# Patient Record
Sex: Male | Born: 1945 | Race: White | Hispanic: No | Marital: Married | Smoking: Former smoker
Health system: Southern US, Community
[De-identification: ages and names within clinical notes are randomized; demographics above are authoritative.]

## PROBLEM LIST (undated history)

## (undated) DIAGNOSIS — I2510 Atherosclerotic heart disease of native coronary artery without angina pectoris: Secondary | ICD-10-CM

## (undated) DIAGNOSIS — G8929 Other chronic pain: Secondary | ICD-10-CM

## (undated) DIAGNOSIS — I10 Essential (primary) hypertension: Secondary | ICD-10-CM

## (undated) DIAGNOSIS — I700 Atherosclerosis of aorta: Secondary | ICD-10-CM

## (undated) DIAGNOSIS — I350 Nonrheumatic aortic (valve) stenosis: Secondary | ICD-10-CM

## (undated) DIAGNOSIS — N1831 Chronic kidney disease, stage 3a: Secondary | ICD-10-CM

## (undated) DIAGNOSIS — K219 Gastro-esophageal reflux disease without esophagitis: Secondary | ICD-10-CM

## (undated) DIAGNOSIS — I5189 Other ill-defined heart diseases: Secondary | ICD-10-CM

## (undated) DIAGNOSIS — Z7982 Long term (current) use of aspirin: Secondary | ICD-10-CM

## (undated) DIAGNOSIS — E669 Obesity, unspecified: Secondary | ICD-10-CM

## (undated) DIAGNOSIS — E785 Hyperlipidemia, unspecified: Secondary | ICD-10-CM

## (undated) DIAGNOSIS — M1990 Unspecified osteoarthritis, unspecified site: Secondary | ICD-10-CM

## (undated) DIAGNOSIS — N529 Male erectile dysfunction, unspecified: Secondary | ICD-10-CM

## (undated) DIAGNOSIS — K861 Other chronic pancreatitis: Secondary | ICD-10-CM

## (undated) DIAGNOSIS — Z961 Presence of intraocular lens: Secondary | ICD-10-CM

## (undated) DIAGNOSIS — E039 Hypothyroidism, unspecified: Secondary | ICD-10-CM

## (undated) DIAGNOSIS — N182 Chronic kidney disease, stage 2 (mild): Secondary | ICD-10-CM

## (undated) DIAGNOSIS — E538 Deficiency of other specified B group vitamins: Secondary | ICD-10-CM

## (undated) DIAGNOSIS — I200 Unstable angina: Secondary | ICD-10-CM

## (undated) DIAGNOSIS — K851 Biliary acute pancreatitis without necrosis or infection: Secondary | ICD-10-CM

## (undated) DIAGNOSIS — F32A Depression, unspecified: Secondary | ICD-10-CM

## (undated) HISTORY — DX: Other chronic pain: G89.29

## (undated) HISTORY — DX: Obesity, unspecified: E66.9

## (undated) HISTORY — DX: Chronic kidney disease, stage 2 (mild): N18.2

## (undated) HISTORY — PX: CATARACT EXTRACTION W/ INTRAOCULAR LENS  IMPLANT, BILATERAL: SHX1307

## (undated) HISTORY — DX: Essential (primary) hypertension: I10

## (undated) HISTORY — PX: BACK SURGERY: SHX140

## (undated) HISTORY — DX: Hyperlipidemia, unspecified: E78.5

## (undated) HISTORY — DX: Nonrheumatic aortic (valve) stenosis: I35.0

## (undated) HISTORY — DX: Atherosclerotic heart disease of native coronary artery without angina pectoris: I25.10

## (undated) HISTORY — PX: HERNIA REPAIR: SHX51

## (undated) HISTORY — PX: TOTAL KNEE ARTHROPLASTY: SHX125

---

## 2008-01-07 ENCOUNTER — Ambulatory Visit: Payer: Self-pay

## 2011-06-27 ENCOUNTER — Ambulatory Visit: Payer: Self-pay | Admitting: Orthopedic Surgery

## 2011-07-24 ENCOUNTER — Ambulatory Visit: Payer: Self-pay | Admitting: Orthopedic Surgery

## 2011-07-24 DIAGNOSIS — I10 Essential (primary) hypertension: Secondary | ICD-10-CM

## 2011-07-24 LAB — BASIC METABOLIC PANEL
Calcium, Total: 9 mg/dL (ref 8.5–10.1)
Chloride: 103 mmol/L (ref 98–107)
Osmolality: 280 (ref 275–301)
Potassium: 4.1 mmol/L (ref 3.5–5.1)

## 2011-07-24 LAB — CBC
HCT: 41 % (ref 40.0–52.0)
MCHC: 32.5 g/dL (ref 32.0–36.0)
MCV: 98 fL (ref 80–100)
RDW: 13.7 % (ref 11.5–14.5)

## 2011-07-24 LAB — PROTIME-INR
INR: 0.9
Prothrombin Time: 12.8 secs (ref 11.5–14.7)

## 2011-08-08 ENCOUNTER — Inpatient Hospital Stay: Payer: Self-pay | Admitting: Orthopedic Surgery

## 2011-08-09 LAB — BASIC METABOLIC PANEL
Anion Gap: 6 — ABNORMAL LOW (ref 7–16)
Calcium, Total: 8.6 mg/dL (ref 8.5–10.1)
Creatinine: 1.1 mg/dL (ref 0.60–1.30)
EGFR (African American): >60
EGFR (Non-African Amer.): >60
Glucose: 79 mg/dL (ref 65–99)
Potassium: 4.4 mmol/L (ref 3.5–5.1)
Sodium: 139 mmol/L (ref 136–145)

## 2011-08-09 LAB — PLATELET COUNT: Platelet: 172 10*3/uL (ref 150–440)

## 2011-08-09 LAB — HEMOGLOBIN: HGB: 12.2 g/dL — ABNORMAL LOW (ref 13.0–18.0)

## 2011-08-12 LAB — HEMOGLOBIN: HGB: 11.5 g/dL — ABNORMAL LOW (ref 13.0–18.0)

## 2011-08-28 DIAGNOSIS — F3340 Major depressive disorder, recurrent, in remission, unspecified: Secondary | ICD-10-CM | POA: Insufficient documentation

## 2012-02-19 ENCOUNTER — Encounter: Payer: Self-pay | Admitting: Cardiovascular Disease

## 2012-02-19 ENCOUNTER — Ambulatory Visit (INDEPENDENT_AMBULATORY_CARE_PROVIDER_SITE_OTHER): Payer: Medicare Other | Admitting: Cardiovascular Disease

## 2012-02-19 VITALS — BP 138/68 | HR 74 | Ht 67.0 in | Wt 241.2 lb

## 2012-02-19 DIAGNOSIS — G8929 Other chronic pain: Secondary | ICD-10-CM

## 2012-02-19 DIAGNOSIS — R079 Chest pain, unspecified: Secondary | ICD-10-CM

## 2012-02-19 DIAGNOSIS — M549 Dorsalgia, unspecified: Secondary | ICD-10-CM

## 2012-02-19 DIAGNOSIS — I739 Peripheral vascular disease, unspecified: Secondary | ICD-10-CM

## 2012-02-19 DIAGNOSIS — E785 Hyperlipidemia, unspecified: Secondary | ICD-10-CM | POA: Insufficient documentation

## 2012-02-19 DIAGNOSIS — R0602 Shortness of breath: Secondary | ICD-10-CM

## 2012-02-19 DIAGNOSIS — I10 Essential (primary) hypertension: Secondary | ICD-10-CM | POA: Insufficient documentation

## 2012-02-19 NOTE — Assessment & Plan Note (Signed)
He is thinking about having back surgery done. We'll evaluate his cardiac risk based on the stress test. I will consider his referring him to a pain clinic for management.

## 2012-02-19 NOTE — Progress Notes (Signed)
HPI  This is a 67 year old male who is self-referred for cardiac evaluation. He is my neighbor. He has no previous cardiac history. He has no history of hypertension, hyperlipidemia and obesity. He suffers from chronic back pain. He has been getting epidural steroid injections at [facility] but his condition seems to be getting worse. Back surgery has been suggested to him. Recently, he has noticed progressive dyspnea with minimal activities with occasional chest discomfort. His physical capacity is very limited due to his back discomfort. He is having hard time losing any weight. Over the last month, he also noticed progressive lower extremity edema. He complains of discomfort in both legs with walking.  it starts with his back and it goes down. He has occasional cyanosis. There is no orthopnea or PND. He is a previous smoker. He has no family history of premature coronary artery disease.  No Known Allergies   No current outpatient prescriptions on file prior to visit.   No current facility-administered medications on file prior to visit.     Past Medical History  Diagnosis Date  . Hyperlipidemia   . Hypertension      Past Surgical History  Procedure Laterality Date  . Back surgery    . Total knee arthroplasty    . Hernia repair       History reviewed. No pertinent family history.   History   Social History  . Marital Status: Married    Spouse Name: N/A    Number of Children: N/A  . Years of Education: N/A   Occupational History  . Not on file.   Social History Main Topics  . Smoking status: Former Smoker -- 2.00 packs/day for 0 years    Types: Cigarettes  . Smokeless tobacco: Not on file  . Alcohol Use: No  . Drug Use: No  . Sexually Active: Not on file   Other Topics Concern  . Not on file   Social History Narrative  . No narrative on file     ROS Constitutional: Negative for fever, chills, diaphoresis, activity change, appetite change and fatigue.  HENT:  Negative for hearing loss, nosebleeds, congestion, sore throat, facial swelling, drooling, trouble swallowing, neck pain, voice change, sinus pressure and tinnitus.  Eyes: Negative for photophobia, pain, discharge and visual disturbance.  Respiratory: Negative for apnea, cough  and wheezing.  Cardiovascular: Negative for  palpitations . Gastrointestinal: Negative for nausea, vomiting, abdominal pain, diarrhea, constipation, blood in stool and abdominal distention.  Genitourinary: Negative for dysuria, urgency, frequency, hematuria and decreased urine volume.  Musculoskeletal: Negative for myalgias, back pain, joint swelling, arthralgias and gait problem.  Skin: Negative for color change, pallor, rash and wound.  Neurological: Negative for dizziness, tremors, seizures, syncope, speech difficulty, weakness, light-headedness, numbness and headaches.  Psychiatric/Behavioral: Negative for suicidal ideas, hallucinations, behavioral problems and agitation. The patient is not nervous/anxious.     PHYSICAL EXAM   BP 138/68  Pulse 74  Ht 5\' 7"  (1.702 m)  Wt 241 lb 4 oz (109.43 kg)  BMI 37.78 kg/m2 Constitutional: He is oriented to person, place, and time. He appears well-developed and well-nourished. No distress.  HENT: No nasal discharge.  Head: Normocephalic and atraumatic.  Eyes: Pupils are equal and round. Right eye exhibits no discharge. Left eye exhibits no discharge.  Neck: Normal range of motion. Neck supple. No JVD present. No thyromegaly present.  Cardiovascular: Normal rate, regular rhythm, normal heart sounds . Exam reveals no gallop and no friction rub. There is a  1/6 systolic ejection murmur at the aortic area and the left sternal border. He has diminished pedal pulses. Pulmonary/Chest: Effort normal and breath sounds normal. No stridor. No respiratory distress. He has no wheezes. He has no rales. He exhibits no tenderness.  Abdominal: Soft. Bowel sounds are normal. He exhibits no  distension. There is no tenderness. There is no rebound and no guarding.  Musculoskeletal: Normal range of motion. He exhibits +1 edema and no tenderness.  Neurological: He is alert and oriented to person, place, and time. Coordination normal.  Skin: Skin is warm and dry. No rash noted. He is not diaphoretic. No erythema. No pallor.  Psychiatric: He has a normal mood and affect. His behavior is normal. Judgment and thought content normal.        EKG: Sinus  Rhythm  Low voltage in precordial leads.   ABNORMAL    ASSESSMENT AND PLAN

## 2012-02-19 NOTE — Patient Instructions (Addendum)
Your physician has requested that you have a lexiscan myoview. For further information please visit [email or web address] Please follow instruction sheet, as given.  Your physician has requested that you have an echocardiogram. Echocardiography is a painless test that uses sound waves to create images of your heart. It provides your doctor with information about the size and shape of your heart and how well your heart's chambers and valves are working. This procedure takes approximately one hour. There are no restrictions for this procedure.  Your physician has requested that you have an ankle brachial index (ABI). During this test an ultrasound and blood pressure cuff are used to evaluate the arteries that supply the arms and legs with blood. Allow thirty minutes for this exam. There are no restrictions or special instructions.

## 2012-02-19 NOTE — Assessment & Plan Note (Signed)
He has multiple risk factors for coronary artery disease. Chest pain is worrisome especially with associated dyspnea with minimal activities. I recommend further evaluation with a pharmacologic nuclear stress test. He is not able to exercise on a treadmill due to significant back pain.

## 2012-02-19 NOTE — Assessment & Plan Note (Signed)
He has a faint heart murmur with significant dyspnea. I will obtain an echocardiogram. We have to make sure he has no significant pulmonary hypertension especially with his lower extremity edema.

## 2012-02-19 NOTE — Assessment & Plan Note (Signed)
It's difficult to tell if his symptoms are related to his back problems or whether it's claudication. He does have risk factors and has diminished pedal pulses. I will request an ABI.

## 2012-02-27 ENCOUNTER — Other Ambulatory Visit (INDEPENDENT_AMBULATORY_CARE_PROVIDER_SITE_OTHER): Payer: Medicare Other

## 2012-02-27 ENCOUNTER — Other Ambulatory Visit: Payer: Self-pay

## 2012-02-27 DIAGNOSIS — R0602 Shortness of breath: Secondary | ICD-10-CM

## 2012-02-27 DIAGNOSIS — R079 Chest pain, unspecified: Secondary | ICD-10-CM

## 2012-03-04 ENCOUNTER — Ambulatory Visit: Payer: Self-pay | Admitting: Cardiovascular Disease

## 2012-03-04 DIAGNOSIS — R079 Chest pain, unspecified: Secondary | ICD-10-CM

## 2012-03-07 ENCOUNTER — Telehealth: Payer: Self-pay

## 2012-03-07 ENCOUNTER — Encounter (INDEPENDENT_AMBULATORY_CARE_PROVIDER_SITE_OTHER): Payer: Medicare Other

## 2012-03-07 DIAGNOSIS — R0989 Other specified symptoms and signs involving the circulatory and respiratory systems: Secondary | ICD-10-CM

## 2012-03-07 DIAGNOSIS — I70219 Atherosclerosis of native arteries of extremities with intermittent claudication, unspecified extremity: Secondary | ICD-10-CM

## 2012-03-07 DIAGNOSIS — I739 Peripheral vascular disease, unspecified: Secondary | ICD-10-CM

## 2012-03-07 NOTE — Telephone Encounter (Signed)
Message copied by [facility], MELISSA E on Thu Mar 07, 2012  3:13 PM ------      Message from: Lorine Bears A      Created: Thu Mar 07, 2012  3:04 PM       Stress test was normal. Echo was also good. There was mild aortic stenosis which is the cause of heart murmur but not of concern at this time.  ------

## 2012-03-07 NOTE — Telephone Encounter (Signed)
LMTCB

## 2012-03-14 ENCOUNTER — Other Ambulatory Visit: Payer: Self-pay

## 2012-03-14 DIAGNOSIS — R079 Chest pain, unspecified: Secondary | ICD-10-CM

## 2014-04-21 NOTE — Op Note (Signed)
PATIENT NAME:  Jared Baldwin, Jared Baldwin MR#:  [id number] DATE OF BIRTH:  06/08/1945  DATE OF PROCEDURE:  08/08/2011  PREOPERATIVE DIAGNOSIS: Left knee osteoarthritis.   POSTOPERATIVE DIAGNOSIS: Left knee osteoarthritis.   PROCEDURE: Left total knee replacement.   ANESTHESIA: Spinal.   SURGEON: Leitha SchullerMichael J. Paydon Carll, MD  DESCRIPTION OF PROCEDURE: Patient was brought to the Operating Room and after adequate anesthesia was obtained, the left leg was prepped and draped in the usual sterile fashion with a tourniquet applied to the upper thigh along with an Alvarado legholder. After prepping and draping, appropriate patient identification and timeout procedures were completed, the leg was exsanguinated with an Esmarch and the tourniquet raised to 300 mmHg. A midline skin incision was made followed by a medial parapatellar arthrotomy. Inspection showed severe patellofemoral arthritis with extensive osteophytes around the patella as well as in the femoral trochlea. There was exposed bone with eburnation in the medial femoral condyle. Lateral compartment was relatively spared with pitting of the cartilage. The anterior cruciate ligament was intact and the medial tibial condyle also had bone exposed with some eburnation. The anterior cruciate ligament and fat pad were excised and exposure was created to allow for application of the Medacta cutting block to the proximal tibia. After this was applied pins were placed and cut checked. Proximal tibial resection was then carried out with excision of the residual meniscus. At this point the femoral cutting block was applied and drill holes made in the distal femur as well as pins to hold the block in place. The distal femoral cut was made and the 5 cutting block applied to the distal femur. Checking cuts there was no notching of the anterior femur. Anterior, posterior, and chamfer cuts made with the 5 cutting guide. The proximal tibia was trialed with the 5, it seemed like it would  give overhang so a 4 tibial implant was trialed in the appropriate rotation based on a pin from the cutting guide. The center drill hole was made followed by the thinned cutting jig and this was left in place. The 5 femur was placed and a 10 and then 12 mm insert were trialed. The 12 gave better stability as the lateral ligamentous complex had been stretched because of the varus deformity. Full extension was possible with flexion to 110, preop was 10 to 95. The patella was then cut with a patellar guide and measured size 3 after drill holes were placed. The distal femoral trochlear notch cut was carried out along with distal drill holes and all trial components were removed. The posterior capsule was then injected with a combination of morphine, Toradol and local anesthetic. The bony surfaces were thoroughly irrigated and dried. The #4 tibial component was cemented into place with the 12 UC tibial insert being snapped into place followed by the #5 left femoral component. The knee was held in extension as the cement set and the patellar button was cemented in place as well with a clamp applied. After the cement had set and was hard, the excess cement was removed with use of a small osteotome. The knee was again thoroughly irrigated. Patella tracking was normal and there was good mid flexion stability. The arthrotomy was repaired using a heavy quill suture, 2-0 quill subcutaneously and skin staples. Xeroform, 4 x 4's, ABD, Webril, and Ace wrap applied along with a Polar Care. The tourniquet was let down.   TOTAL TOURNIQUET TIME: 90 minutes.   COMPLICATIONS: There were no complications.   ESTIMATED BLOOD LOSS: 50  mL.  IMPLANTS: Medacta GMK primary total knee system, size 3 patella, 4 left fixed tibial tray with a 4 12 mm UC tibial insert, and a 5 left standard femoral component. Simplex P bone cement used.   CONDITION: To recovery room stable.   SPECIMEN: Cut ends of  bone.  ____________________________ Leitha Schuller, MD mjm:cms D: 08/08/2011 17:07:05 ET T: 08/09/2011 09:01:06 ET JOB#: [id number]  cc: Leitha Schuller, MD, <Dictator> Leitha Schuller MD ELECTRONICALLY SIGNED 08/09/2011 12:47

## 2014-04-21 NOTE — Discharge Summary (Signed)
PATIENT NAME:  Jared Baldwin, Jayel MR#:  [id number] DATE OF BIRTH:  [birth year]  DATE OF ADMISSION:  08/08/2011 DATE OF DISCHARGE:  08/12/2011  ADMITTING DIAGNOSIS: Left knee osteoarthritis.   DISCHARGE DIAGNOSIS: Left knee osteoarthritis.   PROCEDURE: Left total knee replacement.   ANESTHESIA: Spinal.   SURGEON: Leitha SchullerMichael J. Menz, M.D.   TOTAL TOURNIQUET TIME: 90 minutes.   COMPLICATIONS: No complications.   ESTIMATED BLOOD LOSS: 50 mL.   IMPLANTS: Medacta GMK primary total knee system size 3 patella, 4 left fixed tibial tray with a 4 12 mm UC tibial insert and 5 left standard femoral component, Simplex P bone cement used.   HISTORY: Patient is a 69 year old with history of chronic knee pain, significant osteoarthritis. He has tried cortisone shots and synvisc which has given him little relief. His left knee bothers him worse than the right. Previous x-rays revealed medial compartment collapse both knees, left was worse than right. He had had tricompartmental degenerative changes. He had previously spoke with Dr. Rosita KeaMenz and is ready to proceed with knee replacement. CT scan of his left knee has been obtained.   PHYSICAL EXAMINATION: HEENT: Head normocephalic, atraumatic. Sclerae clear. Auditory acuity is good. He does wear dentures. HEART: S1, S2 regular rate and rhythm. LUNGS: Clear to auscultation. LEFT KNEE: He is lacking 5 degrees of extension but is able to flex the knee well. Varus alignment is present. Collateral ligaments have been stable to stress testing.   HOSPITAL COURSE: Patient was admitted to the hospital on 08/08/2011. He had surgery that same day and was brought to the orthopedic floor from the PAC-U. Patient's blood as well as vital signs were monitored. Although he did have a small drop in his hemoglobin his hemoglobin remained stable throughout his stay. Throughout his stay his vital signs remained stable. Patient did have a lot of pain mostly with physical therapy. Medications  were changed and he was given Dilaudid 2 mg every four hours for pain. On 08/12/2011 patient's pain was much better. He had progressed well with physical therapy. His vital signs were stable. Hemoglobin was stable and patient was ready for discharge to home with home health.   DISCHARGE INSTRUCTIONS:  1. May gradually increase weight-bearing on the affected extremity. He may elevate the affected foot/leg on 1 or 2 pillows with the foot higher than the knee. He needs to wear TED hose on both legs and remove at bedtime. He needs to replace when arising the next morning.  2. Diet: May resume a regular diet.  3. Medications: Xarelto 10 mg orally once a day in the morning. Pain medications: Oxycodone 5 to 10 mg orally every four hours as needed for pain and OxyContin 10 mg every eight hours.  4. Wound care: He is to apply an ice pack to affected area. Continue using the Polar Care unit maintaining temperature between 40 and 50 degrees Fahrenheit. Do not get the dressing or bandage wet or dirty. [facility] if the dressing gets water under it. [facility] if any of the following occur: Bright red bleeding or fever above 101.5.  5. He is referred home physical therapy as well as home health.  6. [facility] in two weeks.   DISCHARGE MEDICATIONS:  1. Tramadol 50 mg 1 tablet orally p.r.n.  2. Citalopram 20 mg oral tablet 1 tablet orally once a day at night. 3. Lisinopril 10 mg 1 tablet orally once a day  at bedtime.  4. Vytorin 10/20, 1 tablet orally once a day at bedtime.  ____________________________ Evon Slack, PA-C tcg:cms D: 08/12/2011 10:00:02 ET T: 08/14/2011 10:50:57 ET JOB#: [id number]  cc: Evon Slack, PA-C, <Dictator> Evon Slack PA ELECTRONICALLY SIGNED 08/16/2011 12:01

## 2016-04-18 DIAGNOSIS — M1711 Unilateral primary osteoarthritis, right knee: Secondary | ICD-10-CM | POA: Diagnosis not present

## 2016-04-18 DIAGNOSIS — M25561 Pain in right knee: Secondary | ICD-10-CM | POA: Diagnosis not present

## 2016-04-20 DIAGNOSIS — E039 Hypothyroidism, unspecified: Secondary | ICD-10-CM | POA: Diagnosis not present

## 2016-04-20 DIAGNOSIS — G8929 Other chronic pain: Secondary | ICD-10-CM | POA: Diagnosis not present

## 2016-04-20 DIAGNOSIS — Z125 Encounter for screening for malignant neoplasm of prostate: Secondary | ICD-10-CM | POA: Diagnosis not present

## 2016-04-20 DIAGNOSIS — M48061 Spinal stenosis, lumbar region without neurogenic claudication: Secondary | ICD-10-CM | POA: Diagnosis not present

## 2016-04-20 DIAGNOSIS — M545 Low back pain: Secondary | ICD-10-CM | POA: Diagnosis not present

## 2016-04-20 DIAGNOSIS — Z9884 Bariatric surgery status: Secondary | ICD-10-CM | POA: Insufficient documentation

## 2016-04-20 DIAGNOSIS — R202 Paresthesia of skin: Secondary | ICD-10-CM | POA: Diagnosis not present

## 2016-04-20 DIAGNOSIS — K219 Gastro-esophageal reflux disease without esophagitis: Secondary | ICD-10-CM | POA: Diagnosis not present

## 2016-04-20 DIAGNOSIS — I10 Essential (primary) hypertension: Secondary | ICD-10-CM | POA: Diagnosis not present

## 2016-04-20 DIAGNOSIS — E784 Other hyperlipidemia: Secondary | ICD-10-CM | POA: Diagnosis not present

## 2016-04-23 DIAGNOSIS — E538 Deficiency of other specified B group vitamins: Secondary | ICD-10-CM | POA: Insufficient documentation

## 2016-05-18 DIAGNOSIS — E784 Other hyperlipidemia: Secondary | ICD-10-CM | POA: Diagnosis not present

## 2016-05-18 DIAGNOSIS — I10 Essential (primary) hypertension: Secondary | ICD-10-CM | POA: Diagnosis not present

## 2016-05-18 DIAGNOSIS — E538 Deficiency of other specified B group vitamins: Secondary | ICD-10-CM | POA: Diagnosis not present

## 2016-05-18 DIAGNOSIS — K219 Gastro-esophageal reflux disease without esophagitis: Secondary | ICD-10-CM | POA: Diagnosis not present

## 2016-05-18 DIAGNOSIS — Z9884 Bariatric surgery status: Secondary | ICD-10-CM | POA: Diagnosis not present

## 2016-05-18 DIAGNOSIS — E039 Hypothyroidism, unspecified: Secondary | ICD-10-CM | POA: Diagnosis not present

## 2016-05-18 DIAGNOSIS — M48061 Spinal stenosis, lumbar region without neurogenic claudication: Secondary | ICD-10-CM | POA: Diagnosis not present

## 2016-05-18 DIAGNOSIS — M545 Low back pain: Secondary | ICD-10-CM | POA: Diagnosis not present

## 2016-05-18 DIAGNOSIS — G8929 Other chronic pain: Secondary | ICD-10-CM | POA: Diagnosis not present

## 2016-05-30 DIAGNOSIS — M1711 Unilateral primary osteoarthritis, right knee: Secondary | ICD-10-CM | POA: Diagnosis not present

## 2016-06-26 DIAGNOSIS — E039 Hypothyroidism, unspecified: Secondary | ICD-10-CM | POA: Diagnosis not present

## 2016-07-11 ENCOUNTER — Encounter
Admission: RE | Admit: 2016-07-11 | Discharge: 2016-07-11 | Disposition: A | Discharge status: Home / Self Care | Payer: PPO | Source: Ambulatory Visit | Attending: Orthopedic Surgery | Admitting: Orthopedic Surgery

## 2016-07-11 DIAGNOSIS — M1711 Unilateral primary osteoarthritis, right knee: Secondary | ICD-10-CM | POA: Diagnosis not present

## 2016-07-11 DIAGNOSIS — Z01818 Encounter for other preprocedural examination: Secondary | ICD-10-CM | POA: Insufficient documentation

## 2016-07-11 HISTORY — DX: Gastro-esophageal reflux disease without esophagitis: K21.9

## 2016-07-11 HISTORY — DX: Hypothyroidism, unspecified: E03.9

## 2016-07-11 HISTORY — DX: Unspecified osteoarthritis, unspecified site: M19.90

## 2016-07-11 LAB — COMPREHENSIVE METABOLIC PANEL
ALBUMIN: 4 g/dL (ref 3.5–5.0)
ALK PHOS: 79 U/L (ref 38–126)
ALT: 19 U/L (ref 17–63)
AST: 19 U/L (ref 15–41)
Anion gap: 7 (ref 5–15)
BUN: 22 mg/dL — ABNORMAL HIGH (ref 6–20)
CALCIUM: 9.4 mg/dL (ref 8.9–10.3)
CO2: 28 mmol/L (ref 22–32)
CREATININE: 1.23 mg/dL (ref 0.61–1.24)
Chloride: 105 mmol/L (ref 101–111)
GFR calc non Af Amer: 58 mL/min — ABNORMAL LOW (ref >60)
GLUCOSE: 74 mg/dL (ref 65–99)
Potassium: 3.9 mmol/L (ref 3.5–5.1)
SODIUM: 140 mmol/L (ref 135–145)
Total Bilirubin: 0.6 mg/dL (ref 0.3–1.2)
Total Protein: 7.1 g/dL (ref 6.5–8.1)

## 2016-07-11 LAB — CBC
HCT: 42.2 % (ref 40.0–52.0)
HEMOGLOBIN: 14.4 g/dL (ref 13.0–18.0)
MCH: 31.7 pg (ref 26.0–34.0)
MCHC: 34.1 g/dL (ref 32.0–36.0)
MCV: 92.9 fL (ref 80.0–100.0)
Platelets: 179 10*3/uL (ref 150–440)
RBC: 4.55 MIL/uL (ref 4.40–5.90)
RDW: 13.6 % (ref 11.5–14.5)
WBC: 5.7 10*3/uL (ref 3.8–10.6)

## 2016-07-11 LAB — URINALYSIS, ROUTINE W REFLEX MICROSCOPIC
Bilirubin Urine: NEGATIVE
GLUCOSE, UA: NEGATIVE mg/dL
Hgb urine dipstick: NEGATIVE
Ketones, ur: NEGATIVE mg/dL
LEUKOCYTES UA: NEGATIVE
Nitrite: NEGATIVE
PH: 5 (ref 5.0–8.0)
Protein, ur: NEGATIVE mg/dL
SPECIFIC GRAVITY, URINE: 1.017 (ref 1.005–1.030)

## 2016-07-11 LAB — TYPE AND SCREEN
ABO/RH(D): B POS
Antibody Screen: NEGATIVE

## 2016-07-11 LAB — APTT: aPTT: 33 seconds (ref 24–36)

## 2016-07-11 LAB — SURGICAL PCR SCREEN
MRSA, PCR: NEGATIVE
STAPHYLOCOCCUS AUREUS: NEGATIVE

## 2016-07-11 LAB — PROTIME-INR
INR: 1.05
Prothrombin Time: 13.7 seconds (ref 11.4–15.2)

## 2016-07-11 LAB — C-REACTIVE PROTEIN: CRP: <0.8 mg/dL (ref <1.0)

## 2016-07-11 LAB — SEDIMENTATION RATE: Sed Rate: 12 mm/hr (ref 0–20)

## 2016-07-11 NOTE — Patient Instructions (Signed)
Your procedure is scheduled on: 07/24/16 Mon Report to Same Day Surgery 2nd floor medical mall [facility](Medical Mall Entrance-take elevator on left to 2nd floor.  Check in with surgery information desk.) To find out your arrival time please call [phone] between 1PM - 3PM on 07/21/16 Fri  Remember: Instructions that are not followed completely may result in serious medical risk, up to and including death, or upon the discretion of your surgeon and anesthesiologist your surgery may need to be rescheduled.    _x___ 1. Do not eat food or drink liquids after midnight. No gum chewing or                              hard candies.     __x__ 2. No Alcohol for 24 hours before or after surgery.   __x__3. No Smoking for 24 prior to surgery.   ____  4. Bring all medications with you on the day of surgery if instructed.    __x__ 5. Notify your doctor if there is any change in your medical condition     (cold, fever, infections).     Do not wear jewelry, make-up, hairpins, clips or nail polish.  Do not wear lotions, powders, or perfumes. You may wear deodorant.  Do not shave 48 hours prior to surgery. Men may shave face and neck.  Do not bring valuables to the hospital.    [facility] is not responsible for any belongings or valuables.               Contacts, dentures or bridgework may not be worn into surgery.  Leave your suitcase in the car. After surgery it may be brought to your room.  For patients admitted to the hospital, discharge time is determined by your                       treatment team.   Patients discharged the day of surgery will not be allowed to drive home.  You will need someone to drive you home and stay with you the night of your procedure.    Please read over the following fact sheets that you were given:   [facility] Preparing for Surgery and or MRSA Information   _x___ Take anti-hypertensive (unless it includes a diuretic), cardiac, seizure, asthma,     anti-reflux and  psychiatric medicines. These include:  1. levothyroxine (SYNTHROID  2.omeprazole (PRILOSEC  3.pravastatin (PRAVACHOL  4.  5.  6.  ____Fleets enema or Magnesium Citrate as directed.   _x___ Use CHG Soap or sage wipes as directed on instruction sheet   ____ Use inhalers on the day of surgery and bring to hospital day of surgery  ____ Stop Metformin and Janumet 2 days prior to surgery.    ____ Take 1/2 of usual insulin dose the night before surgery and none on the morning     surgery.   _x___ Follow recommendations from Cardiologist, Pulmonologist or PCP regarding          stopping Aspirin, Coumadin, Pllavix ,Eliquis, Effient, or Pradaxa, and Pletal.  X____Stop Anti-inflammatories such as Advil, Aleve, Ibuprofen, Motrin, Naproxen, Naprosyn, Goodies powders or aspirin products. OK to take Tylenol and     Celebrex.   _x___ Stop supplements until after surgery.  But may continue Vitamin D, Vitamin B,       and multivitamin.   ____ Bring C-Pap to the hospital.

## 2016-07-12 LAB — URINE CULTURE
Culture: NO GROWTH
Special Requests: NORMAL

## 2016-07-23 MED ORDER — TRANEXAMIC ACID 1000 MG/10ML IV SOLN
1000.0000 mg | INTRAVENOUS | Status: AC
  Administered 2016-07-24: 1000 mg via INTRAVENOUS
  Filled 2016-07-23: qty 10

## 2016-07-23 MED ORDER — CEFAZOLIN SODIUM-DEXTROSE 2-4 GM/100ML-% IV SOLN
2.0000 g | INTRAVENOUS | Status: AC
  Administered 2016-07-24: 2 g via INTRAVENOUS

## 2016-07-24 ENCOUNTER — Inpatient Hospital Stay: Payer: PPO

## 2016-07-24 ENCOUNTER — Inpatient Hospital Stay: Payer: PPO | Admitting: Anesthesiology

## 2016-07-24 ENCOUNTER — Encounter
Admission: RE | Disposition: A | Discharge status: Home Health | Payer: Self-pay | Source: Ambulatory Visit | Attending: Orthopedic Surgery

## 2016-07-24 ENCOUNTER — Inpatient Hospital Stay
Admission: RE | Admit: 2016-07-24 | Discharge: 2016-07-26 | DRG: 470 | Disposition: A | Discharge status: Home Health | Payer: PPO | Source: Ambulatory Visit | Attending: Orthopedic Surgery | Admitting: Orthopedic Surgery

## 2016-07-24 ENCOUNTER — Encounter: Payer: Self-pay | Admitting: Orthopedic Surgery

## 2016-07-24 DIAGNOSIS — E785 Hyperlipidemia, unspecified: Secondary | ICD-10-CM | POA: Diagnosis not present

## 2016-07-24 DIAGNOSIS — Z471 Aftercare following joint replacement surgery: Secondary | ICD-10-CM | POA: Diagnosis not present

## 2016-07-24 DIAGNOSIS — M1711 Unilateral primary osteoarthritis, right knee: Principal | ICD-10-CM | POA: Diagnosis present

## 2016-07-24 DIAGNOSIS — I10 Essential (primary) hypertension: Secondary | ICD-10-CM | POA: Diagnosis present

## 2016-07-24 DIAGNOSIS — E669 Obesity, unspecified: Secondary | ICD-10-CM | POA: Diagnosis not present

## 2016-07-24 DIAGNOSIS — Z87891 Personal history of nicotine dependence: Secondary | ICD-10-CM

## 2016-07-24 DIAGNOSIS — K219 Gastro-esophageal reflux disease without esophagitis: Secondary | ICD-10-CM | POA: Diagnosis present

## 2016-07-24 DIAGNOSIS — Z96659 Presence of unspecified artificial knee joint: Secondary | ICD-10-CM

## 2016-07-24 DIAGNOSIS — E039 Hypothyroidism, unspecified: Secondary | ICD-10-CM | POA: Diagnosis present

## 2016-07-24 DIAGNOSIS — Z6831 Body mass index (BMI) 31.0-31.9, adult: Secondary | ICD-10-CM | POA: Diagnosis not present

## 2016-07-24 DIAGNOSIS — Z96651 Presence of right artificial knee joint: Secondary | ICD-10-CM | POA: Diagnosis not present

## 2016-07-24 HISTORY — PX: KNEE ARTHROPLASTY: SHX992

## 2016-07-24 LAB — ABO/RH: ABO/RH(D): B POS

## 2016-07-24 SURGERY — ARTHROPLASTY, KNEE, TOTAL, USING IMAGELESS COMPUTER-ASSISTED NAVIGATION
Anesthesia: Spinal | Laterality: Right | Wound class: Clean

## 2016-07-24 MED ORDER — FENTANYL CITRATE (PF) 100 MCG/2ML IJ SOLN
INTRAMUSCULAR | Status: DC | PRN
  Administered 2016-07-24: 50 ug via INTRAVENOUS

## 2016-07-24 MED ORDER — OXYCODONE HCL 5 MG/5ML PO SOLN: 5.0000 mg | Freq: Once | ORAL | Status: DC | PRN

## 2016-07-24 MED ORDER — MIDAZOLAM HCL 5 MG/5ML IJ SOLN
INTRAMUSCULAR | Status: DC | PRN
  Administered 2016-07-24: 1 mg via INTRAVENOUS

## 2016-07-24 MED ORDER — GLYCOPYRROLATE 0.2 MG/ML IJ SOLN
INTRAMUSCULAR | Status: AC
  Filled 2016-07-24: qty 1

## 2016-07-24 MED ORDER — DEXAMETHASONE SODIUM PHOSPHATE 4 MG/ML IJ SOLN
INTRAMUSCULAR | Status: DC | PRN
  Administered 2016-07-24: 6 mg via INTRAVENOUS

## 2016-07-24 MED ORDER — KETAMINE HCL 50 MG/ML IJ SOLN
INTRAMUSCULAR | Status: DC | PRN
  Administered 2016-07-24 (×2): 1.8 mg via INTRAMUSCULAR

## 2016-07-24 MED ORDER — TRAMADOL HCL 50 MG PO TABS
50.0000 mg | ORAL_TABLET | ORAL | Status: DC | PRN
  Administered 2016-07-25 (×2): 100 mg via ORAL
  Filled 2016-07-24 (×2): qty 2

## 2016-07-24 MED ORDER — PHENOL 1.4 % MT LIQD
1.0000 | OROMUCOSAL | Status: DC | PRN
  Filled 2016-07-24: qty 177

## 2016-07-24 MED ORDER — ACETAMINOPHEN 650 MG RE SUPP: 650.0000 mg | Freq: Four times a day (QID) | RECTAL | Status: DC | PRN

## 2016-07-24 MED ORDER — TETRACAINE HCL 1 % IJ SOLN
INTRAMUSCULAR | Status: DC | PRN
Start: 2016-07-24 — End: 2016-07-24
  Administered 2016-07-24: 10 mg via INTRASPINAL

## 2016-07-24 MED ORDER — ALUM & MAG HYDROXIDE-SIMETH 200-200-20 MG/5ML PO SUSP: 30.0000 mL | ORAL | Status: DC | PRN

## 2016-07-24 MED ORDER — PROMETHAZINE HCL 25 MG/ML IJ SOLN: 6.2500 mg | INTRAMUSCULAR | Status: DC | PRN

## 2016-07-24 MED ORDER — FENTANYL CITRATE (PF) 100 MCG/2ML IJ SOLN: 25.0000 ug | INTRAMUSCULAR | Status: DC | PRN

## 2016-07-24 MED ORDER — LACTATED RINGERS IV SOLN
INTRAVENOUS | Status: DC
  Administered 2016-07-24 (×2): via INTRAVENOUS

## 2016-07-24 MED ORDER — ACETAMINOPHEN 10 MG/ML IV SOLN
INTRAVENOUS | Status: AC
  Filled 2016-07-24: qty 100

## 2016-07-24 MED ORDER — SODIUM CHLORIDE 0.9 % IV SOLN
INTRAVENOUS | Status: DC | PRN
  Administered 2016-07-24: 60 mL

## 2016-07-24 MED ORDER — BISACODYL 10 MG RE SUPP
10.0000 mg | Freq: Every day | RECTAL | Status: DC | PRN
  Filled 2016-07-24: qty 1

## 2016-07-24 MED ORDER — PROPOFOL 500 MG/50ML IV EMUL
INTRAVENOUS | Status: DC | PRN
Start: 2016-07-24 — End: 2016-07-24
  Administered 2016-07-24: 40 ug/kg/min via INTRAVENOUS
  Administered 2016-07-24: 50 ug/kg/min via INTRAVENOUS

## 2016-07-24 MED ORDER — NEOMYCIN-POLYMYXIN B GU 40-200000 IR SOLN
Status: DC | PRN
  Administered 2016-07-24: 12 mL
  Administered 2016-07-24: 2 mL

## 2016-07-24 MED ORDER — CEFAZOLIN SODIUM-DEXTROSE 2-4 GM/100ML-% IV SOLN
2.0000 g | Freq: Four times a day (QID) | INTRAVENOUS | Status: AC
  Administered 2016-07-24 – 2016-07-25 (×4): 2 g via INTRAVENOUS
  Filled 2016-07-24 (×4): qty 100

## 2016-07-24 MED ORDER — LEVOTHYROXINE SODIUM 88 MCG PO TABS
88.0000 ug | ORAL_TABLET | Freq: Every day | ORAL | Status: DC
  Administered 2016-07-25 – 2016-07-26 (×2): 88 ug via ORAL
  Filled 2016-07-24 (×2): qty 1

## 2016-07-24 MED ORDER — ACETAMINOPHEN 10 MG/ML IV SOLN
INTRAVENOUS | Status: DC | PRN
  Administered 2016-07-24: 1000 mg via INTRAVENOUS

## 2016-07-24 MED ORDER — VITAMIN B-12 1000 MCG PO TABS
5000.0000 ug | ORAL_TABLET | Freq: Every day | ORAL | Status: DC
  Administered 2016-07-25 – 2016-07-26 (×2): 5000 ug via ORAL
  Filled 2016-07-24 (×2): qty 5

## 2016-07-24 MED ORDER — BUPIVACAINE HCL (PF) 0.25 % IJ SOLN
INTRAMUSCULAR | Status: AC
  Filled 2016-07-24: qty 60

## 2016-07-24 MED ORDER — DIPHENHYDRAMINE HCL 12.5 MG/5ML PO ELIX: 12.5000 mg | ORAL_SOLUTION | ORAL | Status: DC | PRN

## 2016-07-24 MED ORDER — FENTANYL CITRATE (PF) 100 MCG/2ML IJ SOLN
INTRAMUSCULAR | Status: AC
  Filled 2016-07-24: qty 2

## 2016-07-24 MED ORDER — MEPERIDINE HCL 50 MG/ML IJ SOLN: 6.2500 mg | INTRAMUSCULAR | Status: DC | PRN

## 2016-07-24 MED ORDER — TRANEXAMIC ACID 1000 MG/10ML IV SOLN
1000.0000 mg | Freq: Once | INTRAVENOUS | Status: AC
  Administered 2016-07-24: 1000 mg via INTRAVENOUS
  Filled 2016-07-24: qty 10

## 2016-07-24 MED ORDER — MENTHOL 3 MG MT LOZG
1.0000 | LOZENGE | OROMUCOSAL | Status: DC | PRN
  Filled 2016-07-24: qty 9

## 2016-07-24 MED ORDER — CEFAZOLIN SODIUM-DEXTROSE 2-4 GM/100ML-% IV SOLN
INTRAVENOUS | Status: AC
  Filled 2016-07-24: qty 100

## 2016-07-24 MED ORDER — METOCLOPRAMIDE HCL 10 MG PO TABS
10.0000 mg | ORAL_TABLET | Freq: Three times a day (TID) | ORAL | Status: DC
  Administered 2016-07-24 – 2016-07-26 (×7): 10 mg via ORAL
  Filled 2016-07-24 (×6): qty 1

## 2016-07-24 MED ORDER — OXYCODONE HCL 5 MG PO TABS
5.0000 mg | ORAL_TABLET | ORAL | Status: DC | PRN
  Administered 2016-07-24: 5 mg via ORAL
  Administered 2016-07-25 – 2016-07-26 (×5): 10 mg via ORAL
  Administered 2016-07-26: 5 mg via ORAL
  Administered 2016-07-26: 10 mg via ORAL
  Filled 2016-07-24 (×6): qty 2
  Filled 2016-07-24 (×2): qty 1

## 2016-07-24 MED ORDER — MAGNESIUM HYDROXIDE 400 MG/5ML PO SUSP
30.0000 mL | Freq: Every day | ORAL | Status: DC | PRN
  Administered 2016-07-24 – 2016-07-25 (×2): 30 mL via ORAL
  Filled 2016-07-24 (×2): qty 30

## 2016-07-24 MED ORDER — SODIUM CHLORIDE 0.9 % IJ SOLN: INTRAMUSCULAR | Status: DC | PRN

## 2016-07-24 MED ORDER — BUPIVACAINE HCL (PF) 0.25 % IJ SOLN
INTRAMUSCULAR | Status: DC | PRN
  Administered 2016-07-24: 60 mL

## 2016-07-24 MED ORDER — PRAVASTATIN SODIUM 20 MG PO TABS
80.0000 mg | ORAL_TABLET | Freq: Every day | ORAL | Status: DC
  Administered 2016-07-25 – 2016-07-26 (×2): 80 mg via ORAL
  Filled 2016-07-24 (×2): qty 4

## 2016-07-24 MED ORDER — CHLORHEXIDINE GLUCONATE 4 % EX LIQD: 60.0000 mL | Freq: Once | CUTANEOUS | Status: DC

## 2016-07-24 MED ORDER — FLEET ENEMA 7-19 GM/118ML RE ENEM: 1.0000 | ENEMA | Freq: Once | RECTAL | Status: DC | PRN

## 2016-07-24 MED ORDER — SODIUM CHLORIDE 0.9 % IV SOLN
INTRAVENOUS | Status: DC | PRN
  Administered 2016-07-24: 30 ug/min via INTRAVENOUS

## 2016-07-24 MED ORDER — ONDANSETRON HCL 4 MG/2ML IJ SOLN: 4.0000 mg | Freq: Four times a day (QID) | INTRAMUSCULAR | Status: DC | PRN

## 2016-07-24 MED ORDER — ACETAMINOPHEN 10 MG/ML IV SOLN
1000.0000 mg | Freq: Four times a day (QID) | INTRAVENOUS | Status: AC
  Administered 2016-07-24 – 2016-07-25 (×4): 1000 mg via INTRAVENOUS
  Filled 2016-07-24 (×4): qty 100

## 2016-07-24 MED ORDER — NEOMYCIN-POLYMYXIN B GU 40-200000 IR SOLN
Status: AC
  Filled 2016-07-24: qty 20

## 2016-07-24 MED ORDER — LIDOCAINE HCL (PF) 2 % IJ SOLN
INTRAMUSCULAR | Status: AC
  Filled 2016-07-24: qty 2

## 2016-07-24 MED ORDER — OXYCODONE HCL 5 MG PO TABS: 5.0000 mg | ORAL_TABLET | Freq: Once | ORAL | Status: DC | PRN

## 2016-07-24 MED ORDER — MIDAZOLAM HCL 2 MG/2ML IJ SOLN
INTRAMUSCULAR | Status: AC
  Filled 2016-07-24: qty 2

## 2016-07-24 MED ORDER — PROPOFOL 500 MG/50ML IV EMUL
INTRAVENOUS | Status: AC
  Filled 2016-07-24: qty 50

## 2016-07-24 MED ORDER — TETRACAINE HCL 1 % IJ SOLN
INTRAMUSCULAR | Status: AC
  Filled 2016-07-24: qty 2

## 2016-07-24 MED ORDER — KETAMINE HCL 100 MG/ML IJ SOLN
INTRAMUSCULAR | Status: DC | PRN
  Administered 2016-07-24: 5 ug/kg/min via INTRAVENOUS

## 2016-07-24 MED ORDER — BUPIVACAINE LIPOSOME 1.3 % IJ SUSP
INTRAMUSCULAR | Status: AC
  Filled 2016-07-24: qty 20

## 2016-07-24 MED ORDER — SENNOSIDES-DOCUSATE SODIUM 8.6-50 MG PO TABS
1.0000 | ORAL_TABLET | Freq: Two times a day (BID) | ORAL | Status: DC
  Administered 2016-07-24 – 2016-07-26 (×4): 1 via ORAL
  Filled 2016-07-24 (×4): qty 1

## 2016-07-24 MED ORDER — ONDANSETRON HCL 4 MG PO TABS: 4.0000 mg | ORAL_TABLET | Freq: Four times a day (QID) | ORAL | Status: DC | PRN

## 2016-07-24 MED ORDER — MORPHINE SULFATE (PF) 2 MG/ML IV SOLN: 2.0000 mg | INTRAVENOUS | Status: DC | PRN

## 2016-07-24 MED ORDER — PANTOPRAZOLE SODIUM 40 MG PO TBEC
40.0000 mg | DELAYED_RELEASE_TABLET | Freq: Two times a day (BID) | ORAL | Status: DC
  Administered 2016-07-24 – 2016-07-26 (×4): 40 mg via ORAL
  Filled 2016-07-24 (×4): qty 1

## 2016-07-24 MED ORDER — SODIUM CHLORIDE 0.9 % IV SOLN
INTRAVENOUS | Status: DC
  Administered 2016-07-24: 16:00:00 via INTRAVENOUS

## 2016-07-24 MED ORDER — SODIUM CHLORIDE 0.9 % IJ SOLN
INTRAMUSCULAR | Status: AC
  Filled 2016-07-24: qty 50

## 2016-07-24 MED ORDER — ENOXAPARIN SODIUM 30 MG/0.3ML ~~LOC~~ SOLN
30.0000 mg | Freq: Two times a day (BID) | SUBCUTANEOUS | Status: DC
  Administered 2016-07-25 – 2016-07-26 (×3): 30 mg via SUBCUTANEOUS
  Filled 2016-07-24 (×3): qty 0.3

## 2016-07-24 MED ORDER — GLYCOPYRROLATE 0.2 MG/ML IJ SOLN
INTRAMUSCULAR | Status: DC | PRN
Start: 2016-07-24 — End: 2016-07-24
  Administered 2016-07-24: 0.2 mg via INTRAVENOUS

## 2016-07-24 MED ORDER — ACETAMINOPHEN 325 MG PO TABS: 650.0000 mg | ORAL_TABLET | Freq: Four times a day (QID) | ORAL | Status: DC | PRN

## 2016-07-24 MED ORDER — BUPIVACAINE HCL (PF) 0.5 % IJ SOLN
INTRAMUSCULAR | Status: DC | PRN
  Administered 2016-07-24: 2 mL

## 2016-07-24 MED ORDER — PROPOFOL 10 MG/ML IV BOLUS
INTRAVENOUS | Status: DC | PRN
  Administered 2016-07-24 (×3): 18 mg via INTRAVENOUS

## 2016-07-24 MED ORDER — FERROUS SULFATE 325 (65 FE) MG PO TABS
325.0000 mg | ORAL_TABLET | Freq: Two times a day (BID) | ORAL | Status: DC
  Administered 2016-07-25 – 2016-07-26 (×3): 325 mg via ORAL
  Filled 2016-07-24 (×3): qty 1

## 2016-07-24 MED ORDER — CELECOXIB 100 MG PO CAPS
100.0000 mg | ORAL_CAPSULE | Freq: Two times a day (BID) | ORAL | Status: DC | PRN
  Filled 2016-07-24: qty 1

## 2016-07-24 SURGICAL SUPPLY — 62 items
BATTERY INSTRU NAVIGATION (MISCELLANEOUS) ×8 IMPLANT
BLADE SAW 1 (BLADE) ×2 IMPLANT
BLADE SAW 1/2 (BLADE) ×2 IMPLANT
BLADE SAW 70X12.5 (BLADE) IMPLANT
CANISTER SUCT 1200ML W/VALVE (MISCELLANEOUS) ×2 IMPLANT
CANISTER SUCT 3000ML PPV (MISCELLANEOUS) ×4 IMPLANT
CAPT KNEE TOTAL 3 ATTUNE ×2 IMPLANT
CATH TRAY METER 16FR LF (MISCELLANEOUS) ×2 IMPLANT
CEMENT HV SMART SET (Cement) ×4 IMPLANT
COOLER POLAR GLACIER W/PUMP (MISCELLANEOUS) ×2 IMPLANT
CUFF TOURN 24 STER (MISCELLANEOUS) IMPLANT
CUFF TOURN 30 STER DUAL PORT (MISCELLANEOUS) IMPLANT
DRAPE SHEET LG 3/4 BI-LAMINATE (DRAPES) ×2 IMPLANT
DRSG DERMACEA 8X12 NADH (GAUZE/BANDAGES/DRESSINGS) ×2 IMPLANT
DRSG OPSITE POSTOP 4X14 (GAUZE/BANDAGES/DRESSINGS) ×2 IMPLANT
DRSG TEGADERM 4X4.75 (GAUZE/BANDAGES/DRESSINGS) ×2 IMPLANT
DURAPREP 26ML APPLICATOR (WOUND CARE) ×4 IMPLANT
ELECT CAUTERY BLADE 6.4 (BLADE) ×2 IMPLANT
ELECT REM PT RETURN 9FT ADLT (ELECTROSURGICAL) ×2
ELECTRODE REM PT RTRN 9FT ADLT (ELECTROSURGICAL) ×1 IMPLANT
EX-PIN ORTHOLOCK NAV 4X150 (PIN) ×4 IMPLANT
GLOVE BIOGEL M STRL SZ7.5 (GLOVE) ×4 IMPLANT
GLOVE BIOGEL PI IND STRL 9 (GLOVE) ×1 IMPLANT
GLOVE BIOGEL PI INDICATOR 9 (GLOVE) ×1
GLOVE INDICATOR 8.0 STRL GRN (GLOVE) ×2 IMPLANT
GLOVE SURG SYN 9.0  PF PI (GLOVE) ×1
GLOVE SURG SYN 9.0 PF PI (GLOVE) ×1 IMPLANT
GOWN STRL REUS W/ TWL LRG LVL3 (GOWN DISPOSABLE) ×2 IMPLANT
GOWN STRL REUS W/TWL 2XL LVL3 (GOWN DISPOSABLE) ×2 IMPLANT
GOWN STRL REUS W/TWL LRG LVL3 (GOWN DISPOSABLE) ×2
HEMOVAC 400CC 10FR (MISCELLANEOUS) ×2 IMPLANT
HOLDER FOLEY CATH W/STRAP (MISCELLANEOUS) ×2 IMPLANT
HOOD PEEL AWAY FLYTE STAYCOOL (MISCELLANEOUS) ×4 IMPLANT
KIT RM TURNOVER STRD PROC AR (KITS) ×2 IMPLANT
KNIFE SCULPS 14X20 (INSTRUMENTS) ×2 IMPLANT
LABEL OR SOLS (LABEL) ×2 IMPLANT
NDL SAFETY 18GX1.5 (NEEDLE) ×2 IMPLANT
NEEDLE SPNL 20GX3.5 QUINCKE YW (NEEDLE) ×2 IMPLANT
NS IRRIG 500ML POUR BTL (IV SOLUTION) ×2 IMPLANT
PACK TOTAL KNEE (MISCELLANEOUS) ×2 IMPLANT
PAD WRAPON POLAR KNEE (MISCELLANEOUS) ×1 IMPLANT
PIN DRILL QUICK PACK ×2 IMPLANT
PIN FIXATION 1/8DIA X 3INL (PIN) ×2 IMPLANT
PULSAVAC PLUS IRRIG FAN TIP (DISPOSABLE) ×2
SOL .9 NS 3000ML IRR  AL (IV SOLUTION) ×1
SOL .9 NS 3000ML IRR UROMATIC (IV SOLUTION) ×1 IMPLANT
SOL PREP PVP 2OZ (MISCELLANEOUS) ×2
SOLUTION PREP PVP 2OZ (MISCELLANEOUS) ×1 IMPLANT
SPONGE DRAIN TRACH 4X4 STRL 2S (GAUZE/BANDAGES/DRESSINGS) ×2 IMPLANT
STAPLER SKIN PROX 35W (STAPLE) ×2 IMPLANT
STRAP TIBIA SHORT (MISCELLANEOUS) ×2 IMPLANT
SUCTION FRAZIER HANDLE 10FR (MISCELLANEOUS) ×1
SUCTION TUBE FRAZIER 10FR DISP (MISCELLANEOUS) ×1 IMPLANT
SUT VIC AB 0 CT1 36 (SUTURE) ×2 IMPLANT
SUT VIC AB 1 CT1 36 (SUTURE) ×4 IMPLANT
SUT VIC AB 2-0 CT2 27 (SUTURE) ×2 IMPLANT
SYR 20CC LL (SYRINGE) ×2 IMPLANT
SYR 30ML LL (SYRINGE) ×4 IMPLANT
TIP FAN IRRIG PULSAVAC PLUS (DISPOSABLE) ×1 IMPLANT
TOWEL OR 17X26 4PK STRL BLUE (TOWEL DISPOSABLE) ×2 IMPLANT
TOWER CARTRIDGE SMART MIX (DISPOSABLE) ×2 IMPLANT
WRAPON POLAR PAD KNEE (MISCELLANEOUS) ×2

## 2016-07-24 NOTE — Anesthesia Procedure Notes (Signed)
Spinal  Patient location during procedure: OR Start time: 07/24/2016 11:00 AM End time: 07/24/2016 11:08 AM Staffing Anesthesiologist: Randa Lynn AMY Resident/CRNA: Rosaria Ferries, Mazzie Brodrick Performed: resident/CRNA  Preanesthetic Checklist Completed: patient identified, site marked, surgical consent, pre-op evaluation, timeout performed, IV checked, risks and benefits discussed and monitors and equipment checked Spinal Block Patient position: sitting Prep: ChloraPrep Patient monitoring: heart rate, continuous pulse ox, blood pressure and cardiac monitor Approach: midline Location: L4-5 Injection technique: single-shot Needle Needle type: Introducer and Pencil-Tip  Needle gauge: 24 G Needle length: 9 cm Assessment Sensory level: T10 Additional Notes Negative paresthesia. Negative blood return. Positive free-flowing CSF. Expiration date of kit checked and confirmed. Patient tolerated procedure well, without complications.

## 2016-07-24 NOTE — [place] FL2 (Signed)
[place] MEDICAID FL2 LEVEL OF CARE SCREENING TOOL     IDENTIFICATION  Patient Name: Jared Baldwin Birthdate: [birth year] Sex: male Admission Date (Current Location): 07/24/2016  [place] and [place] Number:  Chiropodist and Address:  [facility], [address] [place], [place] [address]      Provider Number: [id number]  Attending Physician Name and Address:  Donato Heinz, MD  Relative Name and Phone Number:       Current Level of Care: Hospital Recommended Level of Care: Skilled Nursing Facility Prior Approval Number:    Date Approved/Denied:   PASRR Number:  ([id number] )  Discharge Plan: SNF    Current Diagnoses: Patient Active Problem List   Diagnosis Date Noted  . 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 Chest pain 02/19/2012  . SOB (shortness of breath) 02/19/2012  . 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 Chronic back pain 02/19/2012  . Hyperlipidemia   . Hypertension     Orientation RESPIRATION BLADDER Height & Weight     Self, Situation, Place  Normal Continent Weight: 200 lb (90.7 kg) Height:  5\' 7"  (170.2 cm)  BEHAVIORAL SYMPTOMS/MOOD NEUROLOGICAL BOWEL NUTRITION STATUS   (none)  (none) Continent Diet (Diet: Clear Liquid )  AMBULATORY STATUS COMMUNICATION OF NEEDS Skin   Extensive Assist Verbally Surgical wounds (Incision: Right Knee )                       Personal Care Assistance Level of Assistance  Bathing, Feeding, Dressing Bathing Assistance: Limited assistance Feeding assistance: Independent Dressing Assistance: Limited assistance     Functional Limitations Info  Sight, Hearing, Speech Sight Info: Adequate Hearing Info: Adequate Speech Info: Adequate    SPECIAL CARE FACTORS FREQUENCY  PT (By licensed PT), OT (By licensed OT)     PT Frequency:  (5) OT Frequency:  (5)            Contractures      Additional Factors Info  Code Status, Allergies Code Status Info:   (Full Code. ) Allergies Info:  (Statins)           Current Medications (07/24/2016):  This is the current hospital active medication list Current Facility-Administered Medications  Medication Dose Route Frequency Provider Last Rate Last Dose  . 0.9 %  sodium chloride infusion   Intravenous Continuous Charlie Seda, Illene Labrador, MD      . acetaminophen (OFIRMEV) IV 1,000 mg  1,000 mg Intravenous Q6H Micaiah Remillard, Illene Labrador, MD      . acetaminophen (TYLENOL) tablet 650 mg  650 mg Oral Q6H PRN Danylah Holden, Illene Labrador, MD       Or  . acetaminophen (TYLENOL) suppository 650 mg  650 mg Rectal Q6H PRN Telma Pyeatt, Illene Labrador, MD      . alum & mag hydroxide-simeth (MAALOX/MYLANTA) 200-200-20 MG/5ML suspension 30 mL  30 mL Oral Q4H PRN Dallas Torok, Illene Labrador, MD      . bisacodyl (DULCOLAX) suppository 10 mg  10 mg Rectal Daily PRN Madhavi Hamblen, Illene Labrador, MD      . ceFAZolin (ANCEF) IVPB 2g/100 mL premix  2 g Intravenous Q6H Kahli Mayon, Illene Labrador, MD      . celecoxib (CELEBREX) capsule 100 mg  100 mg Oral BID PRN Spyridon Hornstein, Illene Labrador, MD      . diphenhydrAMINE (BENADRYL) 12.5 MG/5ML elixir 12.5-25 mg  12.5-25 mg Oral Q4H PRN Donato Heinz, MD      . Melene Muller ON 07/25/2016] enoxaparin (  LOVENOX) injection 30 mg  30 mg Subcutaneous Q12H September Mormile, Illene LabradorJames P, MD      . ferrous sulfate tablet 325 mg  325 mg Oral BID WC Catheleen Langhorne, Illene LabradorJames P, MD      . Melene Muller[START ON 07/25/2016] levothyroxine (SYNTHROID, LEVOTHROID) tablet 88 mcg  88 mcg Oral QAC breakfast Cartier Washko, Illene LabradorJames P, MD      . magnesium hydroxide (MILK OF MAGNESIA) suspension 30 mL  30 mL Oral Daily PRN Elias Dennington, Illene LabradorJames P, MD      . menthol-cetylpyridinium (CEPACOL) lozenge 3 mg  1 lozenge Oral PRN Jaslyn Bansal, Illene LabradorJames P, MD       Or  . phenol (CHLORASEPTIC) mouth spray 1 spray  1 spray Mouth/Throat PRN Cesar Rogerson, Illene LabradorJames P, MD      . metoCLOPramide (REGLAN) tablet 10 mg  10 mg Oral TID AC & HS Gaudencio Chesnut, Illene LabradorJames P, MD      . morphine 2 MG/ML injection 2 mg  2 mg Intravenous Q2H PRN Maryl Blalock, Illene LabradorJames P, MD      . ondansetron (ZOFRAN) tablet 4 mg  4  mg Oral Q6H PRN Reda Citron, Illene LabradorJames P, MD       Or  . ondansetron (ZOFRAN) injection 4 mg  4 mg Intravenous Q6H PRN Neriah Brott, Illene LabradorJames P, MD      . oxyCODONE (Oxy IR/ROXICODONE) immediate release tablet 5-10 mg  5-10 mg Oral Q4H PRN Daviona Herbert, Illene LabradorJames P, MD      . pantoprazole (PROTONIX) EC tablet 40 mg  40 mg Oral BID Jamari Moten, Illene LabradorJames P, MD      . Melene Muller[START ON 07/25/2016] pravastatin (PRAVACHOL) tablet 80 mg  80 mg Oral Daily Reisha Wos, Illene LabradorJames P, MD      . senna-docusate (Senokot-S) tablet 1 tablet  1 tablet Oral BID Sheralyn Pinegar, Illene LabradorJames P, MD      . sodium phosphate (FLEET) 7-19 GM/118ML enema 1 enema  1 enema Rectal Once PRN Fallyn Munnerlyn, Illene LabradorJames P, MD      . traMADol (ULTRAM) tablet 50-100 mg  50-100 mg Oral Q4H PRN Arriel Victor, Illene LabradorJames P, MD      . vitamin B-12 (CYANOCOBALAMIN) tablet 5,000 mcg  5,000 mcg Oral Daily Jamaiyah Pyle, Illene LabradorJames P, MD         Discharge Medications: Please see discharge summary for a list of discharge medications.  Relevant Imaging Results:  Relevant Lab Results:   Additional Information  (SSN: [id number] )  Sample, Darleen CrockerBailey M, LCSW

## 2016-07-24 NOTE — Transfer of Care (Signed)
Immediate Anesthesia Transfer of Care Note  Patient: Jared Baldwin  Procedure(s) Performed: Procedure(s): COMPUTER ASSISTED TOTAL KNEE ARTHROPLASTY (Right)  Patient Location: PACU  Anesthesia Type:Spinal  Level of Consciousness: awake, alert , oriented and patient cooperative  Airway & Oxygen Therapy: Patient Spontanous Breathing and Patient connected to nasal cannula oxygen  Post-op Assessment: Report given to RN and Post -op Vital signs reviewed and stable  Post vital signs: Reviewed and stable  Last Vitals:  Vitals:   07/24/16 0933  BP: 107/70  Pulse: 67  Resp: 20  Temp: 36.6 C    Last Pain:  Vitals:   07/24/16 0933  TempSrc: Oral         Complications: No apparent anesthesia complications

## 2016-07-24 NOTE — Anesthesia Post-op Follow-up Note (Cosign Needed)
Anesthesia QCDR form completed.        

## 2016-07-24 NOTE — Op Note (Signed)
OPERATIVE NOTE  DATE OF SURGERY:  07/24/2016  PATIENT NAME:  Jared Baldwin   DOB: 05-11-1945  MRN: [id number]  PRE-OPERATIVE DIAGNOSIS: Degenerative arthrosis of the right knee, primary  POST-OPERATIVE DIAGNOSIS:  Same  PROCEDURE:  Right total knee arthroplasty using computer-assisted navigation  SURGEON:  Jena Gauss. M.D.  ASSISTANT:  Van Clines, PA (present and scrubbed throughout the case, critical for assistance with exposure, retraction, instrumentation, and closure)  ANESTHESIA: spinal  ESTIMATED BLOOD LOSS: 50 mL  FLUIDS REPLACED: 1300 mL of crystalloid  TOURNIQUET TIME: 92 minutes  DRAINS: 2 medium Hemovac drains  SOFT TISSUE RELEASES: Anterior cruciate ligament, posterior cruciate ligament, deep medial collateral ligament, patellofemoral ligament  IMPLANTS UTILIZED: DePuy Attune size 6 posterior stabilized femoral component (cemented), size 7 rotating platform tibial component (cemented), 41 mm medialized dome patella (cemented), and a 8 mm stabilized rotating platform polyethylene insert.  INDICATIONS FOR SURGERY: Jared Baldwin is a 71 y.o. year old male with a long history of progressive knee pain. X-rays demonstrated severe degenerative changes in tricompartmental fashion. The patient had not seen any significant improvement despite conservative nonsurgical intervention. After discussion of the risks and benefits of surgical intervention, the patient expressed understanding of the risks benefits and agree with plans for total knee arthroplasty.   The risks, benefits, and alternatives were discussed at length including but not limited to the risks of infection, bleeding, nerve injury, stiffness, blood clots, the need for revision surgery, cardiopulmonary complications, among others, and they were willing to proceed.  PROCEDURE IN DETAIL: The patient was brought into the operating room and, after adequate spinal anesthesia was achieved, a tourniquet was placed on  the patient's upper thigh. The patient's knee and leg were cleaned and prepped with alcohol and DuraPrep and draped in the usual sterile fashion. A "timeout" was performed as per usual protocol. The lower extremity was exsanguinated using an Esmarch, and the tourniquet was inflated to 300 mmHg. An anterior longitudinal incision was made followed by a standard mid vastus approach. The deep fibers of the medial collateral ligament were elevated in a subperiosteal fashion off of the medial flare of the tibia so as to maintain a continuous soft tissue sleeve. The patella was subluxed laterally and the patellofemoral ligament was incised. Inspection of the knee demonstrated severe degenerative changes with full-thickness loss of articular cartilage. Osteophytes were debrided using a rongeur. Anterior and posterior cruciate ligaments were excised. Two 4.0 mm Schanz pins were inserted in the femur and into the tibia for attachment of the array of trackers used for computer-assisted navigation. Hip center was identified using a circumduction technique. Distal landmarks were mapped using the computer. The distal femur and proximal tibia were mapped using the computer. The distal femoral cutting guide was positioned using computer-assisted navigation so as to achieve a 5 distal valgus cut. The femur was sized and it was felt that a size 6 femoral component was appropriate. A size 6 femoral cutting guide was positioned and the anterior cut was performed and verified using the computer. This was followed by completion of the posterior and chamfer cuts. Femoral cutting guide for the central box was then positioned in the center box cut was performed.  Attention was then directed to the proximal tibia. Medial and lateral menisci were excised. The extramedullary tibial cutting guide was positioned using computer-assisted navigation so as to achieve a 0 varus-valgus alignment and 3 posterior slope. The cut was performed and  verified using the computer. The proximal tibia was sized  and it was felt that a size 7 tibial tray was appropriate. Tibial and femoral trials were inserted followed by insertion of an 8 mm polyethylene insert. This allowed for excellent mediolateral soft tissue balancing both in flexion and in full extension. Finally, the patella was cut and prepared so as to accommodate a 41 mm medialized dome patella. A patella trial was placed and the knee was placed through a range of motion with excellent patellar tracking appreciated. The femoral trial was removed after debridement of posterior osteophytes. The central post-hole for the tibial component was reamed followed by insertion of a keel punch. Tibial trials were then removed. Cut surfaces of bone were irrigated with copious amounts of normal saline with antibiotic solution using pulsatile lavage and then suctioned dry. Polymethylmethacrylate cement was prepared in the usual fashion using a vacuum mixer. Cement was applied to the cut surface of the proximal tibia as well as along the undersurface of a size 7 rotating platform tibial component. Tibial component was positioned and impacted into place. Excess cement was removed using Personal assistantreer elevators. Cement was then applied to the cut surfaces of the femur as well as along the posterior flanges of the size 6 femoral component. The femoral component was positioned and impacted into place. Excess cement was removed using Personal assistantreer elevators. An 8 mm polyethylene trial was inserted and the knee was brought into full extension with steady axial compression applied. Finally, cement was applied to the backside of a 41 mm medialized dome patella and the patellar component was positioned and patellar clamp applied. Excess cement was removed using Personal assistantreer elevators. After adequate curing of the cement, the tourniquet was deflated after a total tourniquet time of 92 minutes. Hemostasis was achieved using electrocautery. The knee was  irrigated with copious amounts of normal saline with antibiotic solution using pulsatile lavage and then suctioned dry. 20 mL of 1.3% Exparel and 60 mL of 0.25% Marcaine in 40 mL of normal saline was injected along the posterior capsule, medial and lateral gutters, and along the arthrotomy site. An 8 mm stabilized rotating platform polyethylene insert was inserted and the knee was placed through a range of motion with excellent mediolateral soft tissue balancing appreciated and excellent patellar tracking noted. 2 medium drains were placed in the wound bed and brought out through separate stab incisions. The medial parapatellar portion of the incision was reapproximated using interrupted sutures of #1 Vicryl. Subcutaneous tissue was approximated in layers using first #0 Vicryl followed #2-0 Vicryl. The skin was approximated with skin staples. A sterile dressing was applied.  The patient tolerated the procedure well and was transported to the recovery room in stable condition.    Fransheska Willingham P. Angie FavaHooten, Jr., M.D.

## 2016-07-24 NOTE — H&P (Signed)
The patient has been re-examined, and the chart reviewed, and there have been no interval changes to the documented history and physical.    The risks, benefits, and alternatives have been discussed at length. The patient expressed understanding of the risks benefits and agreed with plans for surgical intervention.  James P. Hooten, Jr. M.D.    

## 2016-07-24 NOTE — Anesthesia Preprocedure Evaluation (Signed)
Anesthesia Evaluation  Patient identified by MRN, date of birth, ID band Patient awake    Reviewed: Allergy & Precautions, NPO status , Patient's Chart, lab work & pertinent test results  History of Anesthesia Complications Negative for: history of anesthetic complications  Airway Mallampati: II  TM Distance: >3 FB Neck ROM: Full    Dental  (+) Edentulous Upper, Edentulous Lower   Pulmonary neg sleep apnea, neg COPD, former smoker,    breath sounds clear to auscultation- rhonchi (-) wheezing      Cardiovascular Exercise Tolerance: Good hypertension, Pt. on medications (-) CAD, (-) Past MI and (-) Cardiac Stents  Rhythm:Regular Rate:Normal - Systolic murmurs and - Diastolic murmurs    Neuro/Psych negative neurological ROS  negative psych ROS   GI/Hepatic Neg liver ROS, GERD  ,  Endo/Other  neg diabetesHypothyroidism   Renal/GU negative Renal ROS     Musculoskeletal  (+) Arthritis ,   Abdominal (+) + obese,   Peds  Hematology negative hematology ROS (+)   Anesthesia Other Findings Past Medical History: No date: Arthritis No date: GERD (gastroesophageal reflux disease) No date: Hyperlipidemia No date: Hypertension No date: Hypothyroidism  Past Surgical History: No date: BACK SURGERY No date: CATARACT EXTRACTION W/ INTRAOCULAR LENS  IMPLANT, BILATERAL No date: HERNIA REPAIR No date: TOTAL KNEE ARTHROPLASTY   Reproductive/Obstetrics                             Anesthesia Physical Anesthesia Plan  ASA: II  Anesthesia Plan: Spinal   Post-op Pain Management:    Induction:   PONV Risk Score and Plan: 1 and Propofol  Airway Management Planned: Natural Airway  Additional Equipment:   Intra-op Plan:   Post-operative Plan:   Informed Consent: I have reviewed the patients History and Physical, chart, labs and discussed the procedure including the risks, benefits and  alternatives for the proposed anesthesia with the patient or authorized representative who has indicated his/her understanding and acceptance.   Dental advisory given  Plan Discussed with: Anesthesiologist and CRNA  Anesthesia Plan Comments:         Lab Results  Component Value Date   WBC 5.7 07/11/2016   HGB 14.4 07/11/2016   HCT 42.2 07/11/2016   MCV 92.9 07/11/2016   PLT 179 07/11/2016    Anesthesia Quick Evaluation

## 2016-07-25 ENCOUNTER — Encounter: Payer: Self-pay | Admitting: Orthopedic Surgery

## 2016-07-25 LAB — CBC
HCT: 38.3 % — ABNORMAL LOW (ref 40.0–52.0)
Hemoglobin: 13.2 g/dL (ref 13.0–18.0)
MCH: 32.4 pg (ref 26.0–34.0)
MCHC: 34.5 g/dL (ref 32.0–36.0)
MCV: 93.8 fL (ref 80.0–100.0)
PLATELETS: 159 10*3/uL (ref 150–440)
RBC: 4.08 MIL/uL — ABNORMAL LOW (ref 4.40–5.90)
RDW: 13.3 % (ref 11.5–14.5)
WBC: 9.1 10*3/uL (ref 3.8–10.6)

## 2016-07-25 LAB — BASIC METABOLIC PANEL
Anion gap: 9 (ref 5–15)
BUN: 18 mg/dL (ref 6–20)
CALCIUM: 8.6 mg/dL — AB (ref 8.9–10.3)
CO2: 24 mmol/L (ref 22–32)
CREATININE: 1.04 mg/dL (ref 0.61–1.24)
Chloride: 102 mmol/L (ref 101–111)
GFR calc Af Amer: >60 mL/min (ref >60)
Glucose, Bld: 113 mg/dL — ABNORMAL HIGH (ref 65–99)
Potassium: 4 mmol/L (ref 3.5–5.1)
SODIUM: 135 mmol/L (ref 135–145)

## 2016-07-25 NOTE — Evaluation (Signed)
Physical Therapy Evaluation Patient Details Name: Jared Baldwin MRN: [id number] DOB: December 24, 1945 Today's Date: 07/25/2016   History of Present Illness  Pt is a 71 yo M admitted to acute care post-op R TKA on 7/23. Prior to admission, pt independent with all ADL's, using no AD. PMH: arthritis, GERD, HLD, HTN, and hypothyroidism.   Clinical Impression  Pt is pleasant and participates well with treatment. Pt performs bed mobility, tranfers, and ambulation with CGA, due to impaired strength, endurance, sequencing, and pain. Pt primarily limited by pain. Pt amb total of 240 ft with RW, and no rest breaks; good reciprocal gait pattern. Pt responded well to cues, demonstrating good carryover. Pt demonstrates ability to perform 10 SLRs with independence, therefore does not require KI for mobility. Pt with gait speed of 2.0 ft/sec, indicating a good and age appropriate velocity. Overall, pt responded well to today's treatment with no adverse affects. Pt would benefit from skilled PT to address the previously mentioned impairments and promote return to PLOF. Currently recommending outpatient PT, pending d/c.      Follow Up Recommendations Outpatient PT    Equipment Recommendations  None recommended by PT    Recommendations for Other Services       Precautions / Restrictions Precautions Precautions: Fall Restrictions Weight Bearing Restrictions: Yes Other Position/Activity Restrictions: WBAT      Mobility  Bed Mobility Overal bed mobility: Needs Assistance Bed Mobility: Supine to Sit     Supine to sit: Min guard     General bed mobility comments: Min guard with all bed mobility as pt required increased time for task. Pt required no cues for correct mechanics/safety awareness.   Transfers Overall transfer level: Needs assistance   Transfers: Sit to/from Stand Sit to Stand: Min guard         General transfer comment: MinA with STS, requiring increased time and B UE support from RW.  Pt education re: correct hand/ft placement and body mechanics; WB status. Pt verbalized and demo good understanding.   Ambulation/Gait Ambulation/Gait assistance: Min guard Ambulation Distance (Feet): 280 Feet Assistive device: Rolling walker (2 wheeled) Gait Pattern/deviations: Step-through pattern Gait velocity: 2.0  Gait velocity interpretation: at or above normal speed for age/gender General Gait Details: Good reciprocal gait pattern. REquired min cues for staying close to RW, relaxing shoulders, and looking straight ahead for obstacle navigation. Pt demo good carryover with tasks. Pt required no rest breaks during amb, but reported increased pain to 5/10 following treatment.   Stairs            Wheelchair Mobility    Modified Rankin (Stroke Patients Only)       Balance Overall balance assessment: Needs assistance Sitting-balance support: Bilateral upper extremity supported;Feet supported Sitting balance-Leahy Scale: Good Sitting balance - Comments: Good sitting balance, requiring no cues for mechanics and safety   Standing balance support: Bilateral upper extremity supported Standing balance-Leahy Scale: Fair Standing balance comment: Fair standing balance, as pt requires B UE support from RW, heavily relying on WB through B UE's. Requires no cues.                              Pertinent Vitals/Pain Pain Assessment: 0-10 Pain Score: 3  Pain Location: R knee Pain Descriptors / Indicators: Aching Pain Intervention(s): Limited activity within patient's tolerance;Monitored during session;Repositioned;Relaxation;Ice applied    Home Living Family/patient expects to be discharged to:: Private residence Living Arrangements: Spouse/significant other Available Help at Discharge:  Family Type of Home: House Home Access: Stairs to enter Entrance Stairs-Rails: None Entrance Stairs-Number of Steps: 2 Home Layout: One level Home Equipment: Walker - 2  wheels Additional Comments: Pt independent with all DL's and IADL's, driving. Wife available for assistance.     Prior Function Level of Independence: Independent         Comments: pt indep with ADL, IADL, driving prior to surgery. Has 2WW from previous surgery.     Hand Dominance        Extremity/Trunk Assessment   Upper Extremity Assessment Upper Extremity Assessment: Overall WFL for tasks assessed    Lower Extremity Assessment Lower Extremity Assessment: Generalized weakness (MMT to R LE grossly 4-/5. L LE WFL)    Cervical / Trunk Assessment Cervical / Trunk Assessment: Normal  Communication   Communication: No difficulties  Cognition Arousal/Alertness: Awake/alert Behavior During Therapy: WFL for tasks assessed/performed Overall Cognitive Status: Within Functional Limits for tasks assessed                                        General Comments      Exercises Total Joint Exercises Goniometric ROM: R knee ext lacks 4 deg; flex AAROM at 80 deg.  Other Exercises Other Exercises: Supine therex performed to B R LE with supervision x 10 reps: B ankle pumps, quad sets, glute sets, SLR, and hip abd. Pt with good technique.    Assessment/Plan    PT Assessment Patient needs continued PT services  PT Problem List Decreased strength;Decreased range of motion;Decreased activity tolerance;Decreased balance;Decreased mobility;Decreased coordination;Decreased safety awareness;Pain       PT Treatment Interventions DME instruction;Gait training;Stair training;Functional mobility training;Therapeutic activities;Therapeutic exercise;Balance training;Neuromuscular re-education;Patient/family education    PT Goals (Current goals can be found in the Care Plan section)  Acute Rehab PT Goals Patient Stated Goal: go home PT Goal Formulation: With patient Time For Goal Achievement: 08/08/16 Potential to Achieve Goals: Good    Frequency BID   Barriers to  discharge        Co-evaluation               AM-PAC PT "6 Clicks" Daily Activity  Outcome Measure Difficulty turning over in bed (including adjusting bedclothes, sheets and blankets)?: Total Difficulty moving from lying on back to sitting on the side of the bed? : Total Difficulty sitting down on and standing up from a chair with arms (e.g., wheelchair, bedside commode, etc,.)?: Total Help needed moving to and from a bed to chair (including a wheelchair)?: A Little Help needed walking in hospital room?: A Little Help needed climbing 3-5 steps with a railing? : A Little 6 Click Score: 12    End of Session Equipment Utilized During Treatment: Gait belt Activity Tolerance: Patient tolerated treatment well Patient left: in chair;with call bell/phone within reach;with chair alarm set;with nursing/sitter in room;with SCD's reapplied;Other (comment) (ice) Nurse Communication: Mobility status PT Visit Diagnosis: Unsteadiness on feet (R26.81);Other abnormalities of gait and mobility (R26.89);Pain;Muscle weakness (generalized) (M62.81) Pain - Right/Left: Right Pain - part of body: Knee    Time: 4098-1191 PT Time Calculation (min) (ACUTE ONLY): 38 min   Charges:   PT Evaluation $PT Eval Moderate Complexity: 1 Procedure PT Treatments $Therapeutic Exercise: 8-22 mins   PT G Codes:        Sharman Cheek PT, SPT   Latanya Maudlin 07/25/2016, 5:46 PM

## 2016-07-25 NOTE — Progress Notes (Signed)
   Subjective: 1 Day Post-Op Procedure(s) (LRB): COMPUTER ASSISTED TOTAL KNEE ARTHROPLASTY (Right) Patient reports pain as 2 on 0-10 scale.   Patient is well, and has had no acute complaints or problems We will start therapy today.  Nurse had patient set sided better last night and dangle leg. Plan is to go Home after hospital stay. no nausea and no vomiting Patient denies any chest pains or shortness of breath. Objective: Vital signs in last 24 hours: Temp:  [97.3 F (36.3 C)-98.3 F (36.8 C)] 98.1 F (36.7 C) (07/23 2344) Pulse Rate:  [55-67] 58 (07/23 2344) Resp:  [14-20] 20 (07/23 2344) BP: (95-126)/(65-72) 123/65 (07/23 2344) SpO2:  [97 %-100 %] 97 % (07/23 2344) FiO2 (%):  [21 %] 21 % (07/23 1533) Weight:  [90.7 kg (200 lb)] 90.7 kg (200 lb) (07/23 0933) Heels are non tender and elevated off the bed using rolled towels along with bone foam under operative leg Intake/Output from previous day: 07/23 0701 - 07/24 0700 In: 2925 [I.V.:2525; IV Piggyback:400] Out: 3210 [Urine:2870; Drains:290; Blood:50] Intake/Output this shift: No intake/output data recorded.   Recent Labs  07/25/16 0548  HGB 13.2    Recent Labs  07/25/16 0548  WBC 9.1  RBC 4.08*  HCT 38.3*  PLT 159    Recent Labs  07/25/16 0548  NA 135  K 4.0  CL 102  CO2 24  BUN 18  CREATININE 1.04  GLUCOSE 113*  CALCIUM 8.6*   No results for input(s): LABPT, INR in the last 72 hours.  EXAM General - Patient is Alert, Appropriate and Oriented Extremity - Neurologically intact Neurovascular intact Sensation intact distally Intact pulses distally Dorsiflexion/Plantar flexion intact Compartment soft Dressing - dressing C/D/I Motor Function - intact, moving foot and toes well on exam. Patient able to do straight leg raise on his own  Past Medical History:  Diagnosis Date  . Arthritis   . GERD (gastroesophageal reflux disease)   . Hyperlipidemia   . Hypertension   . Hypothyroidism      Assessment/Plan: 1 Day Post-Op Procedure(s) (LRB): COMPUTER ASSISTED TOTAL KNEE ARTHROPLASTY (Right) Active Problems:   S/P total knee arthroplasty  Estimated body mass index is 31.32 kg/m as calculated from the following:   Height as of this encounter: 5\' 7"  (1.702 m).   Weight as of this encounter: 90.7 kg (200 lb). Advance diet Up with therapy D/C IV fluids Plan for discharge tomorrow Discharge home with home health  Labs: Were reviewed DVT Prophylaxis - Lovenox, Foot Pumps and TED hose Weight-Bearing as tolerated to right leg D/C O2 and Pulse OX and try on Room Air Begin working on bowel movement Labs tomorrow morning  Jon R. Parker Adventist HospitalWolfe PA [facility] 07/25/2016, 7:49 AM

## 2016-07-25 NOTE — Evaluation (Signed)
Occupational Therapy Evaluation Patient Details Name: Jared Baldwin MRN: [id number] DOB: 06/21/1945 Today's Date: 07/25/2016    History of Present Illness 71 yo male pt POD#1 s/p L TKA. Previously had R TKA.   Clinical Impression   Pt seen for OT evaluation this date. Pt was independent at baseline, eager to return to PLOF. Pt presents with deficits in strength/ROM in RLE, pain, and increased need for assist with self care tasks. Pt/spouse educated in AE for LB dressing tasks, DME/AE for self care tasks to improve safety, and compression stocking mgt. Spouse has arthritis in her hands, pt plans to have daughter at home assist with compression stockings. Pt would benefit from skilled OT while in the hospital to maximize return to PLOF.     Follow Up Recommendations  DC plan and follow up therapy as arranged by surgeon    Equipment Recommendations  3 in 1 bedside commode;Other (comment) Lexicographer)    Recommendations for Other Services       Precautions / Restrictions Precautions Precautions: Fall Restrictions Weight Bearing Restrictions: Yes Other Position/Activity Restrictions: WBAT      Mobility Bed Mobility               General bed mobility comments: deferred, pt just finished working with PT, preferred to remain in bed  Transfers                 General transfer comment: deferred, pt just finished working with PT, preferred to remain in bed    Balance                                           ADL either performed or assessed with clinical judgement   ADL Overall ADL's : Needs assistance/impaired                                       General ADL Comments: pt generally min assist level for LB ADL, wife and daughter able to provide necessary level of assist. Spouse/pt educated in AE for LB dressing, DME including 3:1, and compression stocking mgt. Pt/spouse verbalized understanding.     Vision Baseline Vision/History:  No visual deficits Patient Visual Report: No change from baseline       Perception     Praxis      Pertinent Vitals/Pain Pain Assessment: 0-10 Pain Score: 3  Pain Location: R knee Pain Descriptors / Indicators: Aching Pain Intervention(s): Limited activity within patient's tolerance;Monitored during session;Ice applied     Hand Dominance     Extremity/Trunk Assessment Upper Extremity Assessment Upper Extremity Assessment: Overall WFL for tasks assessed   Lower Extremity Assessment Lower Extremity Assessment: Defer to PT evaluation (anticipated weakness/ROM deficits in RLE following knee replacement)   Cervical / Trunk Assessment Cervical / Trunk Assessment: Normal   Communication Communication Communication: No difficulties   Cognition Arousal/Alertness: Awake/alert Behavior During Therapy: WFL for tasks assessed/performed Overall Cognitive Status: Within Functional Limits for tasks assessed                                     General Comments       Exercises Other Exercises Other Exercises: pt/spouse educated in bathroom layout as related to their new home  they are having built, in this current home until approx December 2018. Discussed putting BSC over toilet at home to support increased safety and independence with toilet transfers.    Shoulder Instructions      Home Living Family/patient expects to be discharged to:: Private residence Living Arrangements: Spouse/significant other;Children (daughter living with them temporarily) Available Help at Discharge: Family;Available 24 hours/day (spouse and daughter there) Type of Home: House Home Access: Stairs to enter     Home Layout: One level     Bathroom Shower/Tub: Chief Strategy OfficerTub/shower unit   Bathroom Toilet: Standard     Home Equipment: Environmental consultantWalker - 2 wheels          Prior Functioning/Environment Level of Independence: Independent        Comments: pt indep with ADL, IADL, driving prior to  surgery. Has 2WW from previous surgery.        OT Problem List: Decreased strength;Decreased range of motion;Decreased knowledge of use of DME or AE;Pain      OT Treatment/Interventions: Self-care/ADL training;Therapeutic exercise;Therapeutic activities;DME and/or AE instruction;Patient/family education    OT Goals(Current goals can be found in the care plan section) Acute Rehab OT Goals Patient Stated Goal: go home OT Goal Formulation: With patient/family Time For Goal Achievement: 08/01/16 Potential to Achieve Goals: Good  OT Frequency: Min 1X/week   Barriers to D/C:            Co-evaluation              AM-PAC PT "6 Clicks" Daily Activity     Outcome Measure Help from another person eating meals?: None Help from another person taking care of personal grooming?: None Help from another person toileting, which includes using toliet, bedpan, or urinal?: A Little Help from another person bathing (including washing, rinsing, drying)?: A Little Help from another person to put on and taking off regular upper body clothing?: None Help from another person to put on and taking off regular lower body clothing?: A Little 6 Click Score: 21   End of Session    Activity Tolerance: Patient tolerated treatment well Patient left: in bed;with bed alarm set;with family/visitor present;with SCD's reapplied;Other (comment) (polar care in place)  OT Visit Diagnosis: Other abnormalities of gait and mobility (R26.89);Pain;Muscle weakness (generalized) (M62.81) Pain - Right/Left: Right Pain - part of body: Knee                Time: [id number] OT Time Calculation (min): 18 min Charges:  OT General Charges $OT Visit: 1 Procedure OT Evaluation $OT Eval Low Complexity: 1 Procedure G-Codes:     Richrd PrimeJamie Stiller, MPH, MS, OTR/L ascom [phone] 07/25/16, 4:30 PM

## 2016-07-25 NOTE — Care Management Note (Signed)
Case Management Note  Patient Details  Name: Jared Baldwin MRN: [id number] Date of Birth: 18-Aug-1945  Subjective/Objective:  POD # 1 right TKA. PT recommending OP PT. Discussed with orthopedist and he is in agreement of OP PT.  Met with patient and his wife at bedside to discuss discharge plan. They are both agreeable to OP physical therapy at [facility]. Appointment scheduled for July 27 at 9 am. States he has a walker at home. Pharmacy : [facility]  [phone]. Called Lovenox 40 mg #14. No refills.        Action/Plan:  OP PT at [facility]. Lovenox called in. No DME needs.   Expected Discharge Date:                  Expected Discharge Plan:  [place]  In-House Referral:     Discharge planning Services  CM Consult  Post Acute Care Choice:  Home Health Choice offered to:  Patient  DME Arranged:    DME Agency:  [place] Arranged:  RN, PT [facility] Agency:  [facility]  Status of Service:  Completed, signed off  If discussed at Walker of Stay Meetings, dates discussed:    Additional Comments:  Jolly Mango, RN 07/25/2016, 9:59 AM

## 2016-07-25 NOTE — Discharge Instructions (Signed)

## 2016-07-25 NOTE — Progress Notes (Signed)
A&OX4. VSS. Tolerated foot dangle at bedside well. Pt now has full sensation and can wiggle toes bilaterally. 2+ pedal pulses. Satisfactory incentive spirometer effort.

## 2016-07-25 NOTE — Progress Notes (Signed)
Clinical Social Worker (CSW) received SNF consult. PT is recommending outpatient PT. RN case manager aware of above. Please reconsult if future social work needs arise. CSW signing off.   Mylynn Dinh, LCSW [phone]  

## 2016-07-25 NOTE — Discharge Summary (Signed)
Physician Discharge Summary  Patient ID: Jared Baldwin MRN: [id number] DOB/AGE: 1945-03-04 71 y.o.  Admit date: 07/24/2016 Discharge date: 07/26/2016  Admission Diagnoses:  PRIMARY OSTEOARTHRITIS OF RIGHT KNEE   Discharge Diagnoses: Patient Active Problem List   Diagnosis Date Noted  . 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 Chest pain 02/19/2012  . SOB (shortness of breath) 02/19/2012  . 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 Chronic back pain 02/19/2012  . Hyperlipidemia   . Hypertension     Past Medical History:  Diagnosis Date  . Arthritis   . GERD (gastroesophageal reflux disease)   . Hyperlipidemia   . Hypertension   . Hypothyroidism      Transfusion: No transfusions during this admission   Consultants (if any):   Discharged Condition: Improved  Hospital Course: Jared Baldwin is an 71 y.o. male who was admitted 07/24/2016 with a diagnosis of degenerative arthrosis right knee and went to the operating room on 07/24/2016 and underwent the above named procedures.    Surgeries:Procedure(s): COMPUTER ASSISTED TOTAL KNEE ARTHROPLASTY on 07/24/2016  PRE-OPERATIVE DIAGNOSIS: Degenerative arthrosis of the right knee, primary  POST-OPERATIVE DIAGNOSIS:  Same  PROCEDURE:  Right total knee arthroplasty using computer-assisted navigation  SURGEON:  Jena Gauss. M.D.  ASSISTANT:  Van Clines, PA (present and scrubbed throughout the case, critical for assistance with exposure, retraction, instrumentation, and closure)  ANESTHESIA: spinal  ESTIMATED BLOOD LOSS: 50 mL  FLUIDS REPLACED: 1300 mL of crystalloid  TOURNIQUET TIME: 92 minutes  DRAINS: 2 medium Hemovac drains  SOFT TISSUE RELEASES: Anterior cruciate ligament, posterior cruciate ligament, deep medial collateral ligament, patellofemoral ligament  IMPLANTS UTILIZED: DePuy Attune size 6 posterior stabilized femoral component (cemented), size 7 rotating platform tibial  component (cemented), 41 mm medialized dome patella (cemented), and a 8 mm stabilized rotating platform polyethylene insert.  INDICATIONS FOR SURGERY: Jared Baldwin is a 71 y.o. year old male with a long history of progressive knee pain. X-rays demonstrated severe degenerative changes in tricompartmental fashion. The patient had not seen any significant improvement despite conservative nonsurgical intervention. After discussion of the risks and benefits of surgical intervention, the patient expressed understanding of the risks benefits and agree with plans for total knee arthroplasty.   The risks, benefits, and alternatives were discussed at length including but not limited to the risks of infection, bleeding, nerve injury, stiffness, blood clots, the need for revision surgery, cardiopulmonary complications, among others, and they were willing to proceed.  Patient tolerated the surgery well. No complications .Patient was taken to PACU where she was stabilized and then transferred to the orthopedic floor.  Patient started on Lovenox 30 mg q 12 hrs. Foot pumps applied bilaterally at 80 mm hgb. Heels elevated off bed with rolled towels. No evidence of DVT. Calves non tender. Negative Homan. Physical therapy started on day #1 for gait training and transfer with OT starting on  day #1 for ADL and assisted devices. Patient has done well with therapy. Ambulated greater than 200 feet upon being discharged.  Patient's IV And Foley were discontinued on day #1 with Hemovac being discontinued on day #2. Dressing was changed on day 2 prior to patient being discharged   He was given perioperative antibiotics:  Anti-infectives    Start     Dose/Rate Route Frequency Ordered Stop   07/24/16 1700  ceFAZolin (ANCEF) IVPB 2g/100 mL premix     2 g 200 mL/hr over 30 Minutes Intravenous Every 6 hours 07/24/16 1532 07/25/16 1659  07/24/16 0925  ceFAZolin (ANCEF) 2-4 GM/100ML-% IVPB    Comments:  Renie Ora    : cabinet override      07/24/16 0925 07/24/16 1113   07/24/16 0600  ceFAZolin (ANCEF) IVPB 2g/100 mL premix     2 g 200 mL/hr over 30 Minutes Intravenous On call to O.R. 07/23/16 2140 07/24/16 1118    .  He was fitted with AV 1 compression foot pump devices, instructed on heel pumps, early ambulation, and fitted with TED stockings bilaterally for DVT prophylaxis.  He benefited maximally from the hospital stay and there were no complications.    Recent vital signs:  Vitals:   07/24/16 1505 07/24/16 2344  BP: 126/72 123/65  Pulse: (!) 55 (!) 58  Resp: 14 20  Temp: (!) 97.3 F (36.3 C) 98.1 F (36.7 C)    Recent laboratory studies:  Lab Results  Component Value Date   HGB 13.2 07/25/2016   HGB 14.4 07/11/2016   HGB 11.5 (L) 08/12/2011   Lab Results  Component Value Date   WBC 9.1 07/25/2016   PLT 159 07/25/2016   Lab Results  Component Value Date   INR 1.05 07/11/2016   Lab Results  Component Value Date   NA 135 07/25/2016   K 4.0 07/25/2016   CL 102 07/25/2016   CO2 24 07/25/2016   BUN 18 07/25/2016   CREATININE 1.04 07/25/2016   GLUCOSE 113 (H) 07/25/2016    Discharge Medications:   Allergies as of 07/26/2016      Reactions   Statins Other (See Comments)   Muscle pain with atorvastatin and simvastatin      Medication List    TAKE these medications   celecoxib 100 MG capsule Commonly known as:  CELEBREX Take 100 mg by mouth 2 (two) times daily as needed (pain).   diphenhydrAMINE 25 MG tablet Commonly known as:  SOMINEX Take 25 mg by mouth at bedtime as needed for sleep.   enoxaparin 30 MG/0.3ML injection Commonly known as:  LOVENOX Inject 0.4 mLs (40 mg total) into the skin daily.   levothyroxine 88 MCG tablet Commonly known as:  SYNTHROID, LEVOTHROID Take 88 mcg by mouth daily before breakfast.   omeprazole 40 MG capsule Commonly known as:  PRILOSEC Take 40 mg by mouth daily as needed (heartburn).   oxyCODONE 5 MG immediate release  tablet Commonly known as:  Oxy IR/ROXICODONE Take 1-2 tablets (5-10 mg total) by mouth every 4 (four) hours as needed for severe pain or breakthrough pain.   pravastatin 80 MG tablet Commonly known as:  PRAVACHOL Take 80 mg by mouth daily.   traMADol 50 MG tablet Commonly known as:  ULTRAM Take 1-2 tablets (50-100 mg total) by mouth every 4 (four) hours as needed for moderate pain.   VITAMIN B-12 PO Take 5,000 mcg by mouth daily. Vitamin B12 liquid            Durable Medical Equipment        Start     Ordered   07/24/16 1533  DME Walker rolling  Once    Question:  Patient needs a walker to treat with the following condition  Answer:  Total knee replacement status   07/24/16 1532   07/24/16 1533  DME Bedside commode  Once    Question:  Patient needs a bedside commode to treat with the following condition  Answer:  Total knee replacement status   07/24/16 1532      Diagnostic Studies: Dg Knee  Right Port  Result Date: 07/24/2016 CLINICAL DATA:  Status post right knee replacement EXAM: PORTABLE RIGHT KNEE - 1-2 VIEW COMPARISON:  None. FINDINGS: Right knee prosthesis is seen. Surgical drains are noted in place. No acute bony or soft tissue abnormality is seen. Previous defects from screw placement in the distal femur and proximal tibia are noted. IMPRESSION: Status post knee prosthesis without acute abnormality. Electronically Signed   By: Alcide CleverMark  Lukens M.D.   On: 07/24/2016 14:31    Disposition:     Follow-up Information    Tera PartridgeWolfe, Jon R, PA On 08/08/2016.   Specialty:  Physician Assistant Why:  at 1:15pm Contact information: [address] [facility] [place] [address] [phone]        Donato HeinzHooten, James P, MD On 09/05/2016.   Specialty:  Orthopedic Surgery Why:  at 10:00am Contact information: [address] [facility] [place] [address] [phone]            Signed: Tera PartridgeWOLFE,JON R. 07/25/2016, 7:57 AM

## 2016-07-25 NOTE — Progress Notes (Signed)
Pt ambulated with PT today in the hallway. Per pt pain has been "tolerable" today. Pt has utilized bone foam per MD order today. Pt voids without difficulty. Wife has been in and out intermittently today and is supportive. Will continue to monitor.

## 2016-07-25 NOTE — Progress Notes (Signed)
Physical Therapy Treatment Patient Details Name: Jared Baldwin MRN: [id number] DOB: 16-Jul-1945 Today's Date: 07/25/2016    History of Present Illness Pt is a 71 yo M admitted to acute care post-op R TKA on 7/23. Prior to admission, pt independent with all ADL's, using no AD. PMH: arthritis, GERD, HLD, HTN, and hypothyroidism.     PT Comments    Pt performs bed mobility, tranfers, and ambulation with supervision, due to impaired strength, balance, sequencing, and pain. Pt amb total of 340 ft with RW, with one rest break after 180 ft for stair education and training. Pt with gait speed of 2.5 ft/sec, indicating a good and age appropriate velocity. Overall, pt responded well to today's treatment with no adverse affects, and is progressing well towards functional mobility goals. Pt would benefit from skilled PT to address the previously mentioned impairments and promote return to PLOF. Currently recommending outpatient PT, pending d/c.     Follow Up Recommendations  Outpatient PT     Equipment Recommendations  None recommended by PT    Recommendations for Other Services       Precautions / Restrictions Precautions Precautions: Fall Restrictions Weight Bearing Restrictions: Yes Other Position/Activity Restrictions: WBAT    Mobility  Bed Mobility Overal bed mobility: Needs Assistance Bed Mobility: Supine to Sit     Supine to sit: Supervision     General bed mobility comments: Supervision with supine to sit, requiring increased time. Requires no cues for mechanics or safety.   Transfers Overall transfer level: Needs assistance Equipment used: Rolling walker (2 wheeled) Transfers: Sit to/from Stand Sit to Stand: Supervision         General transfer comment: Supervision with transfers, requiring increased time, use of RW for B UE support, and min cues for ft/hand placement.   Ambulation/Gait Ambulation/Gait assistance: Supervision Ambulation Distance (Feet): 340  Feet Assistive device: Rolling walker (2 wheeled) Gait Pattern/deviations: Step-through pattern Gait velocity: 2.5 Gait velocity interpretation: at or above normal speed for age/gender General Gait Details: Good reciprocal gait pattern. Min cues for relaxing shoulders and remaining inside of RW. Improved gait speed since prior session. Total of 340 ft, with one rest break after ~180 ft for stair education and performance.    Stairs Stairs: Yes   Stair Management: Two rails (Rails in place of pt door frame. ) Number of Stairs: 4 General stair comments: Pt minA with stairs, requiring cues to slow down. Pt education re: good mechanics and safety ("up with the good and down with the bad") Pt demo good carryover with education and wife was present during treat.   Wheelchair Mobility    Modified Rankin (Stroke Patients Only)       Balance Overall balance assessment: Needs assistance Sitting-balance support: Bilateral upper extremity supported;Feet supported Sitting balance-Leahy Scale: Good Sitting balance - Comments: Good sitting balance, requiring no cues for mechanics and safety   Standing balance support: Bilateral upper extremity supported Standing balance-Leahy Scale: Fair Standing balance comment: Fair standing balance, as pt requires B UE support from RW, heavily relying on WB through B UE's. Requires no cues.                             Cognition Arousal/Alertness: Awake/alert Behavior During Therapy: WFL for tasks assessed/performed Overall Cognitive Status: Within Functional Limits for tasks assessed  Exercises Total Joint Exercises Goniometric ROM: R knee ext lacks 4 deg; flex AAROM at 80 deg.  Other Exercises Other Exercises: Supine therex performed to B R LE with supervision x 10 reps: B ankle pumps, quad sets, glute sets, SLR, and hip abd. Pt with good technique. Pt education re: TKA exercise packet.      General Comments        Pertinent Vitals/Pain Pain Assessment: 0-10 Pain Score: 4  Pain Location: R knee Pain Descriptors / Indicators: Aching Pain Intervention(s): Limited activity within patient's tolerance;Premedicated before session;Repositioned;Relaxation;Ice applied    Home Living Family/patient expects to be discharged to:: Private residence Living Arrangements: Spouse/significant other Available Help at Discharge: Family Type of Home: House Home Access: Stairs to enter Entrance Stairs-Rails: None Home Layout: One level Home Equipment: Walker - 2 wheels Additional Comments: Pt independent with all DL's and IADL's, driving. Wife available for assistance.     Prior Function Level of Independence: Independent      Comments: pt indep with ADL, IADL, driving prior to surgery. Has 2WW from previous surgery.   PT Goals (current goals can now be found in the care plan section) Acute Rehab PT Goals Patient Stated Goal: go home PT Goal Formulation: With patient Time For Goal Achievement: 08/08/16 Potential to Achieve Goals: Good Additional Goals Additional Goal #1: Pt to achieve 90 deg of R knee flex for improved gait pattern and progression towards return to PLOF. Progress towards PT goals: Progressing toward goals    Frequency    BID      PT Plan Current plan remains appropriate    Co-evaluation              AM-PAC PT "6 Clicks" Daily Activity  Outcome Measure  Difficulty turning over in bed (including adjusting bedclothes, sheets and blankets)?: A Little Difficulty moving from lying on back to sitting on the side of the bed? : A Little Difficulty sitting down on and standing up from a chair with arms (e.g., wheelchair, bedside commode, etc,.)?: A Little Help needed moving to and from a bed to chair (including a wheelchair)?: A Little Help needed walking in hospital room?: A Little Help needed climbing 3-5 steps with a railing? : A Little 6 Click  Score: 18    End of Session Equipment Utilized During Treatment: Gait belt Activity Tolerance: Patient tolerated treatment well Patient left: in bed;with bed alarm set;with call bell/phone within reach;with family/visitor present;with SCD's reapplied;Other (comment) (ice) Nurse Communication: Mobility status PT Visit Diagnosis: Unsteadiness on feet (R26.81);Other abnormalities of gait and mobility (R26.89);Pain;Muscle weakness (generalized) (M62.81) Pain - Right/Left: Right Pain - part of body: Knee     Time: 1610-9604 PT Time Calculation (min) (ACUTE ONLY): 29 min  Charges:  $Therapeutic Exercise: 8-22 mins                    G Codes:       Sharman Cheek PT, SPT   Latanya Maudlin 07/25/2016, 6:05 PM

## 2016-07-25 NOTE — Anesthesia Postprocedure Evaluation (Signed)
Anesthesia Post Note  Patient: Jared Baldwin  Procedure(s) Performed: Procedure(s) (LRB): COMPUTER ASSISTED TOTAL KNEE ARTHROPLASTY (Right)  Patient location during evaluation: Other Anesthesia Type: Spinal Level of consciousness: oriented, awake and alert and patient cooperative Pain management: pain level controlled Vital Signs Assessment: post-procedure vital signs reviewed and stable Respiratory status: spontaneous breathing, respiratory function stable and patient connected to nasal cannula oxygen Cardiovascular status: blood pressure returned to baseline and stable Postop Assessment: no headache and no backache Anesthetic complications: no     Last Vitals:  Vitals:   07/24/16 1505 07/24/16 2344  BP: 126/72 123/65  Pulse: (!) 55 (!) 58  Resp: 14 20  Temp: (!) 36.3 C 36.7 C    Last Pain:  Vitals:   07/25/16 0545  TempSrc:   PainSc: 0-No pain                 Michaele OfferSavage,  Lourdez Mcgahan A

## 2016-07-25 NOTE — Plan of Care (Signed)
Problem: Activity: Goal: Risk for activity intolerance will decrease Outcome: Progressing Pt tolerated foot dangle well. Tolerates bone foam well.

## 2016-07-26 LAB — CBC
HCT: 36.6 % — ABNORMAL LOW (ref 40.0–52.0)
Hemoglobin: 12.4 g/dL — ABNORMAL LOW (ref 13.0–18.0)
MCH: 31.5 pg (ref 26.0–34.0)
MCHC: 34 g/dL (ref 32.0–36.0)
MCV: 92.7 fL (ref 80.0–100.0)
PLATELETS: 154 10*3/uL (ref 150–440)
RBC: 3.95 MIL/uL — AB (ref 4.40–5.90)
RDW: 13.1 % (ref 11.5–14.5)
WBC: 7.2 10*3/uL (ref 3.8–10.6)

## 2016-07-26 LAB — BASIC METABOLIC PANEL
Anion gap: 7 (ref 5–15)
BUN: 19 mg/dL (ref 6–20)
CALCIUM: 8.6 mg/dL — AB (ref 8.9–10.3)
CO2: 27 mmol/L (ref 22–32)
CREATININE: 1.04 mg/dL (ref 0.61–1.24)
Chloride: 102 mmol/L (ref 101–111)
GFR calc Af Amer: >60 mL/min (ref >60)
GLUCOSE: 115 mg/dL — AB (ref 65–99)
POTASSIUM: 3.8 mmol/L (ref 3.5–5.1)
SODIUM: 136 mmol/L (ref 135–145)

## 2016-07-26 MED ORDER — LACTULOSE 10 GM/15ML PO SOLN
10.0000 g | Freq: Two times a day (BID) | ORAL | Status: DC | PRN
  Administered 2016-07-26: 10 g via ORAL
  Filled 2016-07-26: qty 30

## 2016-07-26 MED ORDER — OXYCODONE HCL 5 MG PO TABS: 5.0000 mg | ORAL_TABLET | ORAL | 0 refills | Status: DC | PRN

## 2016-07-26 MED ORDER — TRAMADOL HCL 50 MG PO TABS: 50.0000 mg | ORAL_TABLET | ORAL | 0 refills | Status: DC | PRN

## 2016-07-26 MED ORDER — ENOXAPARIN SODIUM 30 MG/0.3ML ~~LOC~~ SOLN
40.0000 mg | SUBCUTANEOUS | 0 refills | Status: DC
Start: 2016-07-26 — End: 2016-09-09

## 2016-07-26 NOTE — Care Management Important Message (Signed)
Important Message  Patient Details  Name: Jared Baldwin MRN: [id number] Date of Birth: Dec 16, 1945   Medicare Important Message Given:  N/A - LOS <3 / Initial given by admissions    Marily MemosLisa M Terrelle Ruffolo, RN 07/26/2016, 8:23 AM

## 2016-07-26 NOTE — Care Management (Signed)
[facility] Did not notify RN[facility] of this during assessment. Ordered from [facility]. Offered that they could go to retail store if they didn't want to wait but they do. Will be delivered as soon as possible.

## 2016-07-26 NOTE — Care Management Note (Signed)
Case Management Note  Patient Details  Name: Jared Baldwin MRN: [id number] Date of Birth: 06/01/1945  Subjective/Objective:  Discharging today                  Action/Plan: Cost of Lovenox is $ 3.35. Will update wife. No DME need. OP PT scheduled and wife given appointment time      Expected Discharge Date:  07/26/16               Expected Discharge Plan:  Home w Home Health Services  In-House Referral:     Discharge planning Services  CM Consult  Post Acute Care Choice:  Home Health Choice offered to:  Patient  DME Arranged:    DME Agency:  [facility]  HH Arranged[facility]  [facility]  Status of Service:  Completed, signed off  If discussed at Long Length of Stay Meetings, dates discussed:    Additional Comments:  Marily MemosLisa M Aliviah Spain, RN 07/26/2016, 8:22 AM

## 2016-07-26 NOTE — Progress Notes (Signed)
Patient being discharged home. IV removed with cath intact. Scripts, last dose of meds given, and Lovenox. Patient was able to give himself injection without issues. No lovenox kits available. Gave extra dressing to take home. Reviewed all instructions including activity, dressing care, TEDs, and polar care. Allowed time for questions.

## 2016-07-26 NOTE — Progress Notes (Signed)
Physical Therapy Treatment Patient Details Name: Jared Baldwin MRN: [id number] DOB: [birth year] Today's Date: 07/26/2016    History of Present Illness Pt is a 71 yo M admitted to acute care post-op R TKA on 7/23. Prior to admission, pt independent with all ADL's, using no AD. PMH: arthritis, GERD, HLD, HTN, and hypothyroidism.     PT Comments    Pt pleasant and with reports of less pain and more stiffness this am. Pt performs bed mobility with supervision, tranfers with CGA, and ambulation with supervision. Pt amb 15 ft to chair, but was not limited by functional mobility or pain. Pt performed supine and seated therex this am with supervision, requiring min cues and education on importance of therex. Pt verbalized and demo good carryover with education. Overall, pt responded well to today's treatment with no adverse affects, and has met all functional mobility goals. Pt would benefit from skilled PT to address the previously mentioned impairments and promote return to PLOF. Currently recommending outpatient PT, pending d/c.     Follow Up Recommendations  Outpatient PT     Equipment Recommendations  None recommended by PT    Recommendations for Other Services       Precautions / Restrictions Precautions Precautions: Fall Restrictions Weight Bearing Restrictions: Yes RLE Weight Bearing: Weight bearing as tolerated Other Position/Activity Restrictions: WBAT    Mobility  Bed Mobility Overal bed mobility: Needs Assistance Bed Mobility: Supine to Sit     Supine to sit: Supervision     General bed mobility comments: Cont to be supervision with supine to sit, requiring increased time with HOB elevated. Requires no cues for correct mechanics.   Transfers Overall transfer level: Needs assistance Equipment used: Rolling walker (2 wheeled) Transfers: Sit to/from Stand Sit to Stand: Min guard         General transfer comment: Pt CGA with STS this am, requiring 2 attempts to come  to stand. Provided min cues on hand/ft placement to allow greater success and safety for pt with STS. Pt demo good carryover with second attempt, requiring only increased time.  Ambulation/Gait Ambulation/Gait assistance: Supervision Ambulation Distance (Feet): 15 Feet Assistive device: Rolling walker (2 wheeled) Gait Pattern/deviations: Step-through pattern     General Gait Details: Good reciprocal gait pattern and mechanics amb with RW. Pt did not require cues for safety awareness. Provided cues on relaxing shoulders, for less reliance on WB through UE during amb. Pt demo good carryover.    Stairs            Wheelchair Mobility    Modified Rankin (Stroke Patients Only)       Balance                                            Cognition Arousal/Alertness: Awake/alert Behavior During Therapy: WFL for tasks assessed/performed Overall Cognitive Status: Within Functional Limits for tasks assessed                                        Exercises Total Joint Exercises Goniometric ROM: R knee ext AROM lacking 2 degrees. Flex AAROM at 85 deg. Other Exercises Other Exercises: Supine and seated therex performed to R LE with supervision x15 reps: ankle pumps, quad sets (x25 reps), glute sets, SLR, hip abd, and seated knee  flex stretch with towel. Pt demo good mechanics and technique, requiring min cues for correctly performing quad sests. Educated pt on importance of cont to perform quad sets for acheiving full knee ext ROM.     General Comments        Pertinent Vitals/Pain Pain Assessment: Faces Faces Pain Scale: Hurts a little bit Pain Location: R knee Pain Descriptors / Indicators: Aching Pain Intervention(s): Limited activity within patient's tolerance;Monitored during session;Premedicated before session;Ice applied    Home Living                      Prior Function            PT Goals (current goals can now be found in  the care plan section) Acute Rehab PT Goals Patient Stated Goal: go home PT Goal Formulation: With patient Time For Goal Achievement: 08/08/16 Potential to Achieve Goals: Good Progress towards PT goals: Progressing toward goals    Frequency    BID      PT Plan Current plan remains appropriate    Co-evaluation              AM-PAC PT "6 Clicks" Daily Activity  Outcome Measure  Difficulty turning over in bed (including adjusting bedclothes, sheets and blankets)?: A Little Difficulty moving from lying on back to sitting on the side of the bed? : A Little Difficulty sitting down on and standing up from a chair with arms (e.g., wheelchair, bedside commode, etc,.)?: A Little Help needed moving to and from a bed to chair (including a wheelchair)?: A Little Help needed walking in hospital room?: A Little Help needed climbing 3-5 steps with a railing? : A Little 6 Click Score: 18    End of Session Equipment Utilized During Treatment: Gait belt Activity Tolerance: Patient tolerated treatment well Patient left: in chair;with call bell/phone within reach;with chair alarm set;with SCD's reapplied;Other (comment) (ice) Nurse Communication: Mobility status PT Visit Diagnosis: Unsteadiness on feet (R26.81);Other abnormalities of gait and mobility (R26.89);Pain;Muscle weakness (generalized) (M62.81) Pain - Right/Left: Right Pain - part of body: Knee     Time: 0321-2248 PT Time Calculation (min) (ACUTE ONLY): 19 min  Charges:                       G Codes:       Oran Rein PT, SPT   Bevelyn Ngo 07/26/2016, 11:48 AM

## 2016-07-26 NOTE — Progress Notes (Addendum)
   Subjective: 2 Days Post-Op Procedure(s) (LRB): COMPUTER ASSISTED TOTAL KNEE ARTHROPLASTY (Right) Patient reports pain as 5 on 0-10 scale.   Patient is well, and has had no acute complaints or problems Patient did extremely well with therapy of stable. Walked 340 feet. Did steps. Plan is to go Home after hospital stay. no nausea and no vomiting Patient denies any chest pains or shortness of breath. Objective: Vital signs in last 24 hours: Temp:  [97.5 F (36.4 C)-99.1 F (37.3 C)] 99.1 F (37.3 C) (07/25 0024) Pulse Rate:  [57-75] 73 (07/25 0024) Resp:  [16-19] 19 (07/25 0024) BP: (110-125)/(51-62) 125/62 (07/25 0024) SpO2:  [92 %-99 %] 92 % (07/25 0024) well approximated incision Heels are non tender and elevated off the bed using rolled towels Intake/Output from previous day: 07/24 0701 - 07/25 0700 In: 120 [I.V.:120] Out: 1350 [Urine:1150; Drains:200] Intake/Output this shift: Total I/O In: -  Out: 1000 [Urine:800; Drains:200]   Recent Labs  07/25/16 0548 07/26/16 0441  HGB 13.2 12.4*    Recent Labs  07/25/16 0548 07/26/16 0441  WBC 9.1 7.2  RBC 4.08* 3.95*  HCT 38.3* 36.6*  PLT 159 154    Recent Labs  07/25/16 0548 07/26/16 0441  NA 135 136  K 4.0 3.8  CL 102 102  CO2 24 27  BUN 18 19  CREATININE 1.04 1.04  GLUCOSE 113* 115*  CALCIUM 8.6* 8.6*   No results for input(s): LABPT, INR in the last 72 hours.  EXAM General - Patient is Alert, Appropriate and Oriented Extremity - Neurologically intact Neurovascular intact Sensation intact distally Intact pulses distally Dorsiflexion/Plantar flexion intact No cellulitis present Compartment soft Dressing - dressing C/D/I Motor Function - intact, moving foot and toes well on exam.    Past Medical History:  Diagnosis Date  . Arthritis   . GERD (gastroesophageal reflux disease)   . Hyperlipidemia   . Hypertension   . Hypothyroidism     Assessment/Plan: 2 Days Post-Op Procedure(s)  (LRB): COMPUTER ASSISTED TOTAL KNEE ARTHROPLASTY (Right) Active Problems:   S/P total knee arthroplasty  Estimated body mass index is 31.32 kg/m as calculated from the following:   Height as of this encounter: 5\' 7"  (1.702 m).   Weight as of this encounter: 90.7 kg (200 lb). Up with therapy Discharge home with home health  Labs: Were reviewed DVT Prophylaxis - Lovenox, Foot Pumps and TED hose Weight-Bearing as tolerated to right leg Hemovac discontinued on today's visit. Lactulose ordered. Patient may be discharged to home once he has a bowel movement. Please wash the operative leg and apply TED stockings to both leg prior to being discharged. Please give the patient 2 extra honeycomb dressings to take home. Don't foam is to go home with the patient. Is to be used during the day. Patient will begin outpatient PT in 2 weeks. In the meantime 2 weeks of home physical therapy.  Lynnda ShieldsJon R. [facility] PA [facility] 07/26/2016, 6:58 AM

## 2016-07-27 DIAGNOSIS — Z96659 Presence of unspecified artificial knee joint: Secondary | ICD-10-CM | POA: Diagnosis not present

## 2016-07-28 DIAGNOSIS — Z96651 Presence of right artificial knee joint: Secondary | ICD-10-CM | POA: Diagnosis not present

## 2016-07-28 DIAGNOSIS — M25661 Stiffness of right knee, not elsewhere classified: Secondary | ICD-10-CM | POA: Diagnosis not present

## 2016-07-28 DIAGNOSIS — M25561 Pain in right knee: Secondary | ICD-10-CM | POA: Diagnosis not present

## 2016-08-01 DIAGNOSIS — M25661 Stiffness of right knee, not elsewhere classified: Secondary | ICD-10-CM | POA: Diagnosis not present

## 2016-08-01 DIAGNOSIS — Z96651 Presence of right artificial knee joint: Secondary | ICD-10-CM | POA: Diagnosis not present

## 2016-08-01 DIAGNOSIS — M25561 Pain in right knee: Secondary | ICD-10-CM | POA: Diagnosis not present

## 2016-08-01 DIAGNOSIS — M6281 Muscle weakness (generalized): Secondary | ICD-10-CM | POA: Diagnosis not present

## 2016-08-03 DIAGNOSIS — Z96651 Presence of right artificial knee joint: Secondary | ICD-10-CM | POA: Diagnosis not present

## 2016-08-03 DIAGNOSIS — M25561 Pain in right knee: Secondary | ICD-10-CM | POA: Diagnosis not present

## 2016-08-04 DIAGNOSIS — M25561 Pain in right knee: Secondary | ICD-10-CM | POA: Diagnosis not present

## 2016-08-04 DIAGNOSIS — Z96651 Presence of right artificial knee joint: Secondary | ICD-10-CM | POA: Diagnosis not present

## 2016-08-07 DIAGNOSIS — M25661 Stiffness of right knee, not elsewhere classified: Secondary | ICD-10-CM | POA: Diagnosis not present

## 2016-08-07 DIAGNOSIS — Z96651 Presence of right artificial knee joint: Secondary | ICD-10-CM | POA: Diagnosis not present

## 2016-08-07 DIAGNOSIS — M25561 Pain in right knee: Secondary | ICD-10-CM | POA: Diagnosis not present

## 2016-08-09 DIAGNOSIS — M25661 Stiffness of right knee, not elsewhere classified: Secondary | ICD-10-CM | POA: Diagnosis not present

## 2016-08-09 DIAGNOSIS — Z96651 Presence of right artificial knee joint: Secondary | ICD-10-CM | POA: Diagnosis not present

## 2016-08-09 DIAGNOSIS — M25561 Pain in right knee: Secondary | ICD-10-CM | POA: Diagnosis not present

## 2016-08-11 DIAGNOSIS — Z96651 Presence of right artificial knee joint: Secondary | ICD-10-CM | POA: Diagnosis not present

## 2016-08-11 DIAGNOSIS — M25561 Pain in right knee: Secondary | ICD-10-CM | POA: Diagnosis not present

## 2016-08-14 DIAGNOSIS — E538 Deficiency of other specified B group vitamins: Secondary | ICD-10-CM | POA: Diagnosis not present

## 2016-08-14 DIAGNOSIS — I10 Essential (primary) hypertension: Secondary | ICD-10-CM | POA: Diagnosis not present

## 2016-08-14 DIAGNOSIS — Z96651 Presence of right artificial knee joint: Secondary | ICD-10-CM | POA: Diagnosis not present

## 2016-08-14 DIAGNOSIS — M25561 Pain in right knee: Secondary | ICD-10-CM | POA: Diagnosis not present

## 2016-08-14 DIAGNOSIS — E784 Other hyperlipidemia: Secondary | ICD-10-CM | POA: Diagnosis not present

## 2016-08-16 DIAGNOSIS — M25561 Pain in right knee: Secondary | ICD-10-CM | POA: Diagnosis not present

## 2016-08-16 DIAGNOSIS — Z96651 Presence of right artificial knee joint: Secondary | ICD-10-CM | POA: Diagnosis not present

## 2016-08-18 DIAGNOSIS — Z96651 Presence of right artificial knee joint: Secondary | ICD-10-CM | POA: Diagnosis not present

## 2016-08-18 DIAGNOSIS — M25561 Pain in right knee: Secondary | ICD-10-CM | POA: Diagnosis not present

## 2016-08-21 ENCOUNTER — Other Ambulatory Visit
Admission: RE | Admit: 2016-08-21 | Discharge: 2016-08-21 | Disposition: A | Discharge status: Home / Self Care | Payer: PPO | Source: Ambulatory Visit | Attending: Internal Medicine | Admitting: Internal Medicine

## 2016-08-21 DIAGNOSIS — M545 Low back pain: Secondary | ICD-10-CM | POA: Diagnosis not present

## 2016-08-21 DIAGNOSIS — M48061 Spinal stenosis, lumbar region without neurogenic claudication: Secondary | ICD-10-CM | POA: Diagnosis not present

## 2016-08-21 DIAGNOSIS — E039 Hypothyroidism, unspecified: Secondary | ICD-10-CM | POA: Diagnosis not present

## 2016-08-21 DIAGNOSIS — F418 Other specified anxiety disorders: Secondary | ICD-10-CM | POA: Diagnosis not present

## 2016-08-21 DIAGNOSIS — R1013 Epigastric pain: Secondary | ICD-10-CM | POA: Diagnosis not present

## 2016-08-21 DIAGNOSIS — Z9884 Bariatric surgery status: Secondary | ICD-10-CM | POA: Diagnosis not present

## 2016-08-21 DIAGNOSIS — G8929 Other chronic pain: Secondary | ICD-10-CM | POA: Diagnosis not present

## 2016-08-21 DIAGNOSIS — E538 Deficiency of other specified B group vitamins: Secondary | ICD-10-CM | POA: Diagnosis not present

## 2016-08-21 DIAGNOSIS — I10 Essential (primary) hypertension: Secondary | ICD-10-CM | POA: Diagnosis not present

## 2016-08-21 DIAGNOSIS — E784 Other hyperlipidemia: Secondary | ICD-10-CM | POA: Diagnosis not present

## 2016-08-21 DIAGNOSIS — K219 Gastro-esophageal reflux disease without esophagitis: Secondary | ICD-10-CM | POA: Diagnosis not present

## 2016-08-21 LAB — TROPONIN I: Troponin I: <0.03 ng/mL (ref <0.03)

## 2016-08-23 ENCOUNTER — Other Ambulatory Visit: Payer: Self-pay | Admitting: Internal Medicine

## 2016-08-23 DIAGNOSIS — R748 Abnormal levels of other serum enzymes: Secondary | ICD-10-CM

## 2016-08-23 DIAGNOSIS — Z96651 Presence of right artificial knee joint: Secondary | ICD-10-CM | POA: Diagnosis not present

## 2016-08-24 ENCOUNTER — Ambulatory Visit
Admission: RE | Admit: 2016-08-24 | Discharge: 2016-08-24 | Disposition: A | Discharge status: Home / Self Care | Payer: PPO | Source: Ambulatory Visit | Attending: Internal Medicine | Admitting: Internal Medicine

## 2016-08-24 DIAGNOSIS — R748 Abnormal levels of other serum enzymes: Secondary | ICD-10-CM | POA: Insufficient documentation

## 2016-08-25 DIAGNOSIS — Z96651 Presence of right artificial knee joint: Secondary | ICD-10-CM | POA: Diagnosis not present

## 2016-08-25 DIAGNOSIS — R748 Abnormal levels of other serum enzymes: Secondary | ICD-10-CM | POA: Diagnosis not present

## 2016-08-25 DIAGNOSIS — M25561 Pain in right knee: Secondary | ICD-10-CM | POA: Diagnosis not present

## 2016-08-28 DIAGNOSIS — Z96651 Presence of right artificial knee joint: Secondary | ICD-10-CM | POA: Diagnosis not present

## 2016-08-28 DIAGNOSIS — K921 Melena: Secondary | ICD-10-CM | POA: Diagnosis not present

## 2016-08-28 DIAGNOSIS — M25561 Pain in right knee: Secondary | ICD-10-CM | POA: Diagnosis not present

## 2016-08-30 DIAGNOSIS — Z96651 Presence of right artificial knee joint: Secondary | ICD-10-CM | POA: Diagnosis not present

## 2016-08-30 DIAGNOSIS — M25561 Pain in right knee: Secondary | ICD-10-CM | POA: Diagnosis not present

## 2016-08-31 ENCOUNTER — Other Ambulatory Visit: Payer: Self-pay | Admitting: Internal Medicine

## 2016-08-31 ENCOUNTER — Ambulatory Visit
Admission: RE | Admit: 2016-08-31 | Discharge: 2016-08-31 | Disposition: A | Discharge status: Home / Self Care | Payer: PPO | Source: Ambulatory Visit | Attending: Internal Medicine | Admitting: Internal Medicine

## 2016-08-31 DIAGNOSIS — K449 Diaphragmatic hernia without obstruction or gangrene: Secondary | ICD-10-CM | POA: Diagnosis not present

## 2016-08-31 DIAGNOSIS — R1084 Generalized abdominal pain: Secondary | ICD-10-CM | POA: Insufficient documentation

## 2016-08-31 DIAGNOSIS — M545 Low back pain: Secondary | ICD-10-CM | POA: Diagnosis not present

## 2016-08-31 DIAGNOSIS — I700 Atherosclerosis of aorta: Secondary | ICD-10-CM | POA: Diagnosis not present

## 2016-08-31 DIAGNOSIS — K5730 Diverticulosis of large intestine without perforation or abscess without bleeding: Secondary | ICD-10-CM | POA: Insufficient documentation

## 2016-08-31 DIAGNOSIS — R748 Abnormal levels of other serum enzymes: Secondary | ICD-10-CM | POA: Insufficient documentation

## 2016-08-31 DIAGNOSIS — R109 Unspecified abdominal pain: Secondary | ICD-10-CM | POA: Diagnosis not present

## 2016-08-31 DIAGNOSIS — E538 Deficiency of other specified B group vitamins: Secondary | ICD-10-CM | POA: Diagnosis not present

## 2016-08-31 DIAGNOSIS — E785 Hyperlipidemia, unspecified: Secondary | ICD-10-CM | POA: Diagnosis not present

## 2016-08-31 DIAGNOSIS — K219 Gastro-esophageal reflux disease without esophagitis: Secondary | ICD-10-CM | POA: Diagnosis not present

## 2016-08-31 DIAGNOSIS — Z9884 Bariatric surgery status: Secondary | ICD-10-CM | POA: Diagnosis not present

## 2016-08-31 DIAGNOSIS — Z9889 Other specified postprocedural states: Secondary | ICD-10-CM | POA: Insufficient documentation

## 2016-08-31 DIAGNOSIS — K8689 Other specified diseases of pancreas: Secondary | ICD-10-CM | POA: Diagnosis not present

## 2016-08-31 DIAGNOSIS — E039 Hypothyroidism, unspecified: Secondary | ICD-10-CM | POA: Diagnosis not present

## 2016-08-31 DIAGNOSIS — G8929 Other chronic pain: Secondary | ICD-10-CM | POA: Diagnosis not present

## 2016-08-31 MED ORDER — IOPAMIDOL (ISOVUE-300) INJECTION 61%
100.0000 mL | Freq: Once | INTRAVENOUS | Status: AC | PRN
  Administered 2016-08-31: 100 mL via INTRAVENOUS

## 2016-09-01 ENCOUNTER — Emergency Department
Admission: EM | Admit: 2016-09-01 | Discharge: 2016-09-01 | Disposition: A | Discharge status: Home / Self Care | Payer: PPO | Attending: Emergency Medicine | Admitting: Emergency Medicine

## 2016-09-01 ENCOUNTER — Encounter: Payer: Self-pay | Admitting: Emergency Medicine

## 2016-09-01 DIAGNOSIS — Z96651 Presence of right artificial knee joint: Secondary | ICD-10-CM | POA: Diagnosis not present

## 2016-09-01 DIAGNOSIS — E039 Hypothyroidism, unspecified: Secondary | ICD-10-CM | POA: Diagnosis not present

## 2016-09-01 DIAGNOSIS — Z87891 Personal history of nicotine dependence: Secondary | ICD-10-CM | POA: Diagnosis not present

## 2016-09-01 DIAGNOSIS — M25561 Pain in right knee: Secondary | ICD-10-CM | POA: Diagnosis not present

## 2016-09-01 DIAGNOSIS — Z79899 Other long term (current) drug therapy: Secondary | ICD-10-CM | POA: Diagnosis not present

## 2016-09-01 DIAGNOSIS — Z7902 Long term (current) use of antithrombotics/antiplatelets: Secondary | ICD-10-CM | POA: Insufficient documentation

## 2016-09-01 DIAGNOSIS — R1013 Epigastric pain: Secondary | ICD-10-CM | POA: Diagnosis not present

## 2016-09-01 DIAGNOSIS — R799 Abnormal finding of blood chemistry, unspecified: Secondary | ICD-10-CM | POA: Diagnosis present

## 2016-09-01 DIAGNOSIS — I10 Essential (primary) hypertension: Secondary | ICD-10-CM | POA: Diagnosis not present

## 2016-09-01 LAB — COMPREHENSIVE METABOLIC PANEL
ALBUMIN: 3.9 g/dL (ref 3.5–5.0)
ALT: 153 U/L — AB (ref 17–63)
AST: 101 U/L — AB (ref 15–41)
Alkaline Phosphatase: 495 U/L — ABNORMAL HIGH (ref 38–126)
Anion gap: 8 (ref 5–15)
BUN: 20 mg/dL (ref 6–20)
CO2: 26 mmol/L (ref 22–32)
CREATININE: 1.1 mg/dL (ref 0.61–1.24)
Calcium: 9.2 mg/dL (ref 8.9–10.3)
Chloride: 104 mmol/L (ref 101–111)
GFR calc Af Amer: >60 mL/min (ref >60)
GLUCOSE: 94 mg/dL (ref 65–99)
Potassium: 3.3 mmol/L — ABNORMAL LOW (ref 3.5–5.1)
SODIUM: 138 mmol/L (ref 135–145)
Total Bilirubin: 1 mg/dL (ref 0.3–1.2)
Total Protein: 7.1 g/dL (ref 6.5–8.1)

## 2016-09-01 LAB — URINALYSIS, COMPLETE (UACMP) WITH MICROSCOPIC
BACTERIA UA: NONE SEEN
Glucose, UA: NEGATIVE mg/dL
Hgb urine dipstick: NEGATIVE
Ketones, ur: NEGATIVE mg/dL
Leukocytes, UA: NEGATIVE
Nitrite: NEGATIVE
Protein, ur: 30 mg/dL — AB
SPECIFIC GRAVITY, URINE: 1.036 — AB (ref 1.005–1.030)
pH: 5 (ref 5.0–8.0)

## 2016-09-01 LAB — LIPASE, BLOOD: LIPASE: 71 U/L — AB (ref 11–51)

## 2016-09-01 LAB — CBC
HEMATOCRIT: 35.9 % — AB (ref 40.0–52.0)
Hemoglobin: 12 g/dL — ABNORMAL LOW (ref 13.0–18.0)
MCH: 30.5 pg (ref 26.0–34.0)
MCHC: 33.4 g/dL (ref 32.0–36.0)
MCV: 91.5 fL (ref 80.0–100.0)
PLATELETS: 229 10*3/uL (ref 150–440)
RBC: 3.92 MIL/uL — ABNORMAL LOW (ref 4.40–5.90)
RDW: 15.1 % — AB (ref 11.5–14.5)
WBC: 7.5 10*3/uL (ref 3.8–10.6)

## 2016-09-01 NOTE — ED Notes (Signed)
Family updated on wait time. 

## 2016-09-01 NOTE — ED Triage Notes (Signed)
Pt sent by doctor for elevated liver enzymes/pancreatitis.  AST is elevated from prior labs, but ALT is down.  Alk phos has elevated some.  Pt c/o RUQ/epigastric pain.  Denies NVD. Denies fevers. Ambulatory to triage.  Supposed to see GI Thursday but told him to come to ED since labs were going back up. Alert and oriented

## 2016-09-01 NOTE — ED Provider Notes (Addendum)
[facility] Emergency Department Provider Note   ____________________________________________   First MD Initiated Contact with Patient 09/01/16 2125     (approximate)  I have reviewed the triage vital signs and the nursing notes.   HISTORY  Chief Complaint Abnormal Lab    HPI Jared Baldwin is a 71 y.o. male Who was called by his doctors and told to come in. He has been having some upper abdominal pain. He was taking Carafate and some other medicine that sounds like it was probably an acid blocker. He had an ultrasound done on the 23rd which showed sludge but no gallbladder wall thickening. Today his lipase was 71 and his alkaline phosphatase was elevated he had some epigastric pain but this is since resolved completely. CT showed chronic pancreatitis Per patient and wife he does not drink alcohol  Past Medical History:  Diagnosis Date  . Arthritis   . GERD (gastroesophageal reflux disease)   . Hyperlipidemia   . Hypertension   . Hypothyroidism     Patient Active Problem List   Diagnosis Date Noted  . 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 Chest pain 02/19/2012  . SOB (shortness of breath) 02/19/2012  . 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 Chronic back pain 02/19/2012  . Hyperlipidemia   . Hypertension     Past Surgical History:  Procedure Laterality Date  . BACK SURGERY    . CATARACT EXTRACTION W/ INTRAOCULAR LENS  IMPLANT, BILATERAL    . HERNIA REPAIR    . KNEE ARTHROPLASTY Right 07/24/2016   Procedure: COMPUTER ASSISTED TOTAL KNEE ARTHROPLASTY;  Surgeon: Donato Heinz, MD;  Location: [facility] ORS;  Service: Orthopedics;  Laterality: Right;  . TOTAL KNEE ARTHROPLASTY      Prior to Admission medications   Medication Sig Start Date End Date Taking? Authorizing Provider  celecoxib (CELEBREX) 100 MG capsule Take 100 mg by mouth 2 (two) times daily as needed (pain).    [provider]  Cyanocobalamin (VITAMIN B-12  PO) Take 5,000 mcg by mouth daily. Vitamin B12 liquid    [provider]  diphenhydrAMINE (SOMINEX) 25 MG tablet Take 25 mg by mouth at bedtime as needed for sleep.    [provider]  enoxaparin (LOVENOX) 30 MG/0.3ML injection Inject 0.4 mLs (40 mg total) into the skin daily. 07/26/16   Tera Partridge, PA  levothyroxine (SYNTHROID, LEVOTHROID) 88 MCG tablet Take 88 mcg by mouth daily before breakfast.    [provider]  omeprazole (PRILOSEC) 40 MG capsule Take 40 mg by mouth daily as needed (heartburn).     [provider]  oxyCODONE (OXY IR/ROXICODONE) 5 MG immediate release tablet Take 1-2 tablets (5-10 mg total) by mouth every 4 (four) hours as needed for severe pain or breakthrough pain. 07/26/16   Tera Partridge, PA  pravastatin (PRAVACHOL) 80 MG tablet Take 80 mg by mouth daily.    [provider]  traMADol (ULTRAM) 50 MG tablet Take 1-2 tablets (50-100 mg total) by mouth every 4 (four) hours as needed for moderate pain. 07/26/16   Tera Partridge, PA    Allergies Statins  History reviewed. No pertinent family history.  Social History Social History  Substance Use Topics  . Smoking status: Former Smoker    Packs/day: 2.00    Years: 0.00    Types: Cigarettes    Quit date: 07/11/1997  . Smokeless tobacco: Never Used  . Alcohol use No    Review of Systems  Constitutional: No fever/chills Eyes: No visual changes. ENT: No sore throat. Cardiovascular: Denies chest pain. Respiratory: Denies shortness of breath. Gastrointestinal: No abdominal pain at present.  No nausea, no vomiting.  No diarrhea.  No constipation. Genitourinary: Negative for dysuria. Musculoskeletal: Negative for back pain. Skin: Negative for rash. Neurological: Negative for headaches, focal weakness   ____________________________________________   PHYSICAL EXAM:  VITAL SIGNS: ED Triage Vitals  Enc Vitals Group     BP 09/01/16 1751 138/67     Pulse Rate 09/01/16 1751  70     Resp 09/01/16 1751 18     Temp 09/01/16 1751 99.1 F (37.3 C)     Temp Source 09/01/16 1751 Oral     SpO2 09/01/16 1751 99 %     Weight 09/01/16 1753 192 lb (87.1 kg)     Height 09/01/16 1753 5\' 7"  (1.702 m)     Head Circumference --      Peak Flow --      Pain Score 09/01/16 1829 1     Pain Loc --      Pain Edu? --      Excl. in GC? --     Constitutional: Alert and oriented. Well appearing and in no acute distress. Eyes: Conjunctivae are normal. Head: Atraumatic. Nose: No congestion/rhinnorhea. Mouth/Throat: Mucous membranes are moist.  Oropharynx non-erythematous. Neck: No stridor.   Cardiovascular: Normal rate, regular rhythm. Grossly normal heart sounds.  Good peripheral circulation. Respiratory: Normal respiratory effort.  No retractions. Lungs CTAB. Gastrointestinal: Soft and nontender even to deep palpation. No distention. No abdominal bruits. No CVA tenderness. Musculoskeletal: No lower extremity tenderness nor edema.  No joint effusions. Neurologic:  Normal speech and language. No gross focal neurologic deficits are appreciated. Skin:  Skin is warm, dry and intact. No rash noted. Psychiatric: Mood and affect are normal. Speech and behavior are normal.  ____________________________________________   LABS (all labs ordered are listed, but only abnormal results are displayed)  Labs Reviewed  LIPASE, BLOOD - Abnormal; Notable for the following:       Result Value   Lipase 71 (*)    All other components within normal limits  COMPREHENSIVE METABOLIC PANEL - Abnormal; Notable for the following:    Potassium 3.3 (*)    AST 101 (*)    ALT 153 (*)    Alkaline Phosphatase 495 (*)    All other components within normal limits  CBC - Abnormal; Notable for the following:    RBC 3.92 (*)    Hemoglobin 12.0 (*)    HCT 35.9 (*)    RDW 15.1 (*)    All other components within normal limits  URINALYSIS, COMPLETE (UACMP) WITH MICROSCOPIC - Abnormal; Notable for the  following:    Color, Urine AMBER (*)    APPearance CLEAR (*)    Specific Gravity, Urine 1.036 (*)    Bilirubin Urine SMALL (*)    Protein, ur 30 (*)    Squamous Epithelial / LPF 0-5 (*)    All other components within normal limits   ____________________________________________  EKG   ____________________________________________  RADIOLOGY  No results found.  ____________________________________________   PROCEDURES  Procedure(s) performed:   Procedures  Critical Care performed:   ____________________________________________   INITIAL IMPRESSION / ASSESSMENT AND PLAN / ED COURSE  Pertinent labs & imaging results that were available during my care of the patient were reviewed by me and considered in my medical decision making (see chart for details).   Discussed with Dr. Bosie Clos  GI. He recommends trying to see if that patient can eat if he can eat without any pain and follow up outpatient. Patient has no point with his GI doctor on Wednesday.    Patient is able to eat without any pain. ____________________________________________   FINAL CLINICAL IMPRESSION(S) / ED DIAGNOSES  Final diagnoses:  Epigastric pain      NEW MEDICATIONS STARTED DURING THIS VISIT:  Discharge Medication List as of 09/01/2016 11:14 PM       Note:  This document was prepared using Dragon voice recognition software and may include unintentional dictation errors.    Arnaldo NatalMalinda, Barrie Wale F, MD 09/02/16 [id number]    Arnaldo NatalMalinda, Lenette Rau F, MD 09/02/16 [phone]

## 2016-09-01 NOTE — ED Notes (Signed)
Patient tolerated meal tray.  NAD

## 2016-09-01 NOTE — Discharge Instructions (Signed)
Please return for worse pain fever or vomiting. Be sure to follow-up with gastroenterologist Wednesday as planned. They should be able to access all the information in the computer from here and your regular office visits.if the pain gets bad do not wait at home come into the hospital.

## 2016-09-05 DIAGNOSIS — Z96651 Presence of right artificial knee joint: Secondary | ICD-10-CM | POA: Diagnosis not present

## 2016-09-06 ENCOUNTER — Encounter: Payer: Self-pay | Admitting: *Deleted

## 2016-09-06 ENCOUNTER — Inpatient Hospital Stay
Admission: EM | Admit: 2016-09-06 | Discharge: 2016-09-10 | DRG: 419 | Disposition: A | Discharge status: Home / Self Care | Payer: PPO | Attending: Internal Medicine | Admitting: Internal Medicine

## 2016-09-06 ENCOUNTER — Inpatient Hospital Stay: Payer: PPO

## 2016-09-06 DIAGNOSIS — I10 Essential (primary) hypertension: Secondary | ICD-10-CM | POA: Diagnosis present

## 2016-09-06 DIAGNOSIS — K8050 Calculus of bile duct without cholangitis or cholecystitis without obstruction: Secondary | ICD-10-CM | POA: Diagnosis not present

## 2016-09-06 DIAGNOSIS — E039 Hypothyroidism, unspecified: Secondary | ICD-10-CM | POA: Diagnosis not present

## 2016-09-06 DIAGNOSIS — M1990 Unspecified osteoarthritis, unspecified site: Secondary | ICD-10-CM | POA: Diagnosis present

## 2016-09-06 DIAGNOSIS — R1013 Epigastric pain: Secondary | ICD-10-CM | POA: Diagnosis not present

## 2016-09-06 DIAGNOSIS — K219 Gastro-esophageal reflux disease without esophagitis: Secondary | ICD-10-CM | POA: Diagnosis present

## 2016-09-06 DIAGNOSIS — Z96651 Presence of right artificial knee joint: Secondary | ICD-10-CM | POA: Diagnosis not present

## 2016-09-06 DIAGNOSIS — Z79899 Other long term (current) drug therapy: Secondary | ICD-10-CM | POA: Diagnosis not present

## 2016-09-06 DIAGNOSIS — Z961 Presence of intraocular lens: Secondary | ICD-10-CM | POA: Diagnosis not present

## 2016-09-06 DIAGNOSIS — K851 Biliary acute pancreatitis without necrosis or infection: Secondary | ICD-10-CM

## 2016-09-06 DIAGNOSIS — R110 Nausea: Secondary | ICD-10-CM | POA: Diagnosis not present

## 2016-09-06 DIAGNOSIS — R1011 Right upper quadrant pain: Secondary | ICD-10-CM | POA: Diagnosis not present

## 2016-09-06 DIAGNOSIS — R740 Nonspecific elevation of levels of transaminase and lactic acid dehydrogenase [LDH]: Secondary | ICD-10-CM | POA: Diagnosis not present

## 2016-09-06 DIAGNOSIS — K8010 Calculus of gallbladder with chronic cholecystitis without obstruction: Secondary | ICD-10-CM | POA: Diagnosis not present

## 2016-09-06 DIAGNOSIS — K828 Other specified diseases of gallbladder: Secondary | ICD-10-CM | POA: Diagnosis not present

## 2016-09-06 DIAGNOSIS — K8030 Calculus of bile duct with cholangitis, unspecified, without obstruction: Secondary | ICD-10-CM | POA: Diagnosis not present

## 2016-09-06 DIAGNOSIS — R109 Unspecified abdominal pain: Secondary | ICD-10-CM

## 2016-09-06 DIAGNOSIS — K429 Umbilical hernia without obstruction or gangrene: Secondary | ICD-10-CM | POA: Diagnosis present

## 2016-09-06 DIAGNOSIS — Z9884 Bariatric surgery status: Secondary | ICD-10-CM | POA: Diagnosis not present

## 2016-09-06 DIAGNOSIS — K861 Other chronic pancreatitis: Secondary | ICD-10-CM | POA: Diagnosis not present

## 2016-09-06 DIAGNOSIS — K8020 Calculus of gallbladder without cholecystitis without obstruction: Secondary | ICD-10-CM | POA: Diagnosis not present

## 2016-09-06 DIAGNOSIS — R7989 Other specified abnormal findings of blood chemistry: Secondary | ICD-10-CM

## 2016-09-06 DIAGNOSIS — R945 Abnormal results of liver function studies: Secondary | ICD-10-CM

## 2016-09-06 DIAGNOSIS — R748 Abnormal levels of other serum enzymes: Secondary | ICD-10-CM | POA: Diagnosis present

## 2016-09-06 DIAGNOSIS — E876 Hypokalemia: Secondary | ICD-10-CM | POA: Diagnosis present

## 2016-09-06 DIAGNOSIS — Z87891 Personal history of nicotine dependence: Secondary | ICD-10-CM

## 2016-09-06 DIAGNOSIS — K859 Acute pancreatitis without necrosis or infection, unspecified: Secondary | ICD-10-CM

## 2016-09-06 DIAGNOSIS — Z9841 Cataract extraction status, right eye: Secondary | ICD-10-CM

## 2016-09-06 DIAGNOSIS — E785 Hyperlipidemia, unspecified: Secondary | ICD-10-CM | POA: Diagnosis not present

## 2016-09-06 DIAGNOSIS — Z9842 Cataract extraction status, left eye: Secondary | ICD-10-CM | POA: Diagnosis not present

## 2016-09-06 DIAGNOSIS — R938 Abnormal findings on diagnostic imaging of other specified body structures: Secondary | ICD-10-CM | POA: Diagnosis not present

## 2016-09-06 DIAGNOSIS — R932 Abnormal findings on diagnostic imaging of liver and biliary tract: Secondary | ICD-10-CM | POA: Diagnosis not present

## 2016-09-06 DIAGNOSIS — I700 Atherosclerosis of aorta: Secondary | ICD-10-CM | POA: Insufficient documentation

## 2016-09-06 LAB — CBC
HCT: 36.7 % — ABNORMAL LOW (ref 40.0–52.0)
Hemoglobin: 12.5 g/dL — ABNORMAL LOW (ref 13.0–18.0)
MCH: 31 pg (ref 26.0–34.0)
MCHC: 33.9 g/dL (ref 32.0–36.0)
MCV: 91.4 fL (ref 80.0–100.0)
PLATELETS: 231 10*3/uL (ref 150–440)
RBC: 4.02 MIL/uL — AB (ref 4.40–5.90)
RDW: 15.1 % — ABNORMAL HIGH (ref 11.5–14.5)
WBC: 5.2 10*3/uL (ref 3.8–10.6)

## 2016-09-06 LAB — COMPREHENSIVE METABOLIC PANEL
ALBUMIN: 3.9 g/dL (ref 3.5–5.0)
ALT: 250 U/L — AB (ref 17–63)
AST: 147 U/L — AB (ref 15–41)
Alkaline Phosphatase: 485 U/L — ABNORMAL HIGH (ref 38–126)
Anion gap: 9 (ref 5–15)
BUN: 17 mg/dL (ref 6–20)
CHLORIDE: 103 mmol/L (ref 101–111)
CO2: 26 mmol/L (ref 22–32)
CREATININE: 1.16 mg/dL (ref 0.61–1.24)
Calcium: 9.4 mg/dL (ref 8.9–10.3)
GFR calc non Af Amer: >60 mL/min (ref >60)
GLUCOSE: 101 mg/dL — AB (ref 65–99)
Potassium: 3.3 mmol/L — ABNORMAL LOW (ref 3.5–5.1)
SODIUM: 138 mmol/L (ref 135–145)
Total Bilirubin: 1.2 mg/dL (ref 0.3–1.2)
Total Protein: 7.2 g/dL (ref 6.5–8.1)

## 2016-09-06 LAB — URINALYSIS, COMPLETE (UACMP) WITH MICROSCOPIC
Bacteria, UA: NONE SEEN
Bilirubin Urine: NEGATIVE
Glucose, UA: NEGATIVE mg/dL
Hgb urine dipstick: NEGATIVE
KETONES UR: NEGATIVE mg/dL
Leukocytes, UA: NEGATIVE
Nitrite: NEGATIVE
PH: 5 (ref 5.0–8.0)
PROTEIN: NEGATIVE mg/dL
Specific Gravity, Urine: 1.018 (ref 1.005–1.030)

## 2016-09-06 LAB — LIPASE, BLOOD: LIPASE: 735 U/L — AB (ref 11–51)

## 2016-09-06 MED ORDER — ONDANSETRON HCL 4 MG PO TABS: 4.0000 mg | ORAL_TABLET | Freq: Four times a day (QID) | ORAL | Status: DC | PRN

## 2016-09-06 MED ORDER — POLYETHYLENE GLYCOL 3350 17 G PO PACK
17.0000 g | PACK | Freq: Every day | ORAL | Status: DC | PRN
  Administered 2016-09-10: 17 g via ORAL
  Filled 2016-09-06: qty 1

## 2016-09-06 MED ORDER — OXYCODONE HCL 5 MG PO TABS
5.0000 mg | ORAL_TABLET | ORAL | Status: DC | PRN
  Administered 2016-09-06: 5 mg via ORAL
  Filled 2016-09-06: qty 1

## 2016-09-06 MED ORDER — ACETAMINOPHEN 325 MG PO TABS
650.0000 mg | ORAL_TABLET | Freq: Four times a day (QID) | ORAL | Status: DC | PRN
  Administered 2016-09-09 – 2016-09-10 (×2): 650 mg via ORAL
  Filled 2016-09-06 (×3): qty 2

## 2016-09-06 MED ORDER — ACETAMINOPHEN 650 MG RE SUPP: 650.0000 mg | Freq: Four times a day (QID) | RECTAL | Status: DC | PRN

## 2016-09-06 MED ORDER — ENOXAPARIN SODIUM 40 MG/0.4ML ~~LOC~~ SOLN
40.0000 mg | SUBCUTANEOUS | Status: DC
  Administered 2016-09-06 – 2016-09-09 (×3): 40 mg via SUBCUTANEOUS
  Filled 2016-09-06 (×3): qty 0.4

## 2016-09-06 MED ORDER — ALBUTEROL SULFATE (2.5 MG/3ML) 0.083% IN NEBU: 2.5000 mg | INHALATION_SOLUTION | RESPIRATORY_TRACT | Status: DC | PRN

## 2016-09-06 MED ORDER — ONDANSETRON HCL 4 MG/2ML IJ SOLN
4.0000 mg | Freq: Four times a day (QID) | INTRAMUSCULAR | Status: DC | PRN
  Administered 2016-09-09: 4 mg via INTRAVENOUS
  Filled 2016-09-06: qty 2

## 2016-09-06 MED ORDER — POTASSIUM CHLORIDE IN NACL 20-0.9 MEQ/L-% IV SOLN
INTRAVENOUS | Status: DC
  Administered 2016-09-06 – 2016-09-09 (×5): via INTRAVENOUS
  Filled 2016-09-06 (×11): qty 1000

## 2016-09-06 MED ORDER — MORPHINE SULFATE (PF) 2 MG/ML IV SOLN: 2.0000 mg | INTRAVENOUS | Status: DC | PRN

## 2016-09-06 NOTE — ED Notes (Signed)
Report called to jennifer rn floor nurse 

## 2016-09-06 NOTE — ED Notes (Signed)
Patient placed in hospital gown and placed on cardiac monitor.

## 2016-09-06 NOTE — ED Notes (Signed)
Pt reports pain in upper abd radiating into right side and back.    Pt sent to er for eval of abnormal labs.  Pt reports nausea.  No vomiting.  Diarrhea last week.    Pt alert.   Speech clear.

## 2016-09-06 NOTE — H&P (Signed)
[employer] [facility] [facility] [place] [facility]   PATIENT NAME: Jared Baldwin    MR#:  [id number]  DATE OF BIRTH:  Oct 15, 1945  DATE OF ADMISSION:  09/06/2016  PRIMARY CARE PHYSICIAN: Lynnea FerrierKlein, Bert J III, MD   REQUESTING/REFERRING PHYSICIAN: Dr. York CeriseForbach  CHIEF COMPLAINT:   Chief Complaint  Patient presents with  . Abnormal Lab   HISTORY OF PRESENT ILLNESS:  Jared Baldwin  is a 71 y.o. male with a known history of gastric sleeve surgery returns to the emergency room sent in from Dr. Reyes IvanSkulskie's office due to abdominal pain and nausea with elevated liver enzymes. Patient was recently seen in the emergency room and found to have elevated liver enzymes with ultrasound showing gallbladder sludge without cholecystitis. Patient was discharged home for symptomatic management and follow-up with GI. [facility] and due to ongoing symptoms was sent to the emergency room. Also CT scan of the abdomen showed chronic pancreatitis. Patient has no history of pancreatitis.  PAST MEDICAL HISTORY:   Past Medical History:  Diagnosis Date  . Arthritis   . GERD (gastroesophageal reflux disease)   . Hyperlipidemia   . Hypertension   . Hypothyroidism    PAST SURGICAL HISTORY:   Past Surgical History:  Procedure Laterality Date  . BACK SURGERY    . CATARACT EXTRACTION W/ INTRAOCULAR LENS  IMPLANT, BILATERAL    . HERNIA REPAIR    . KNEE ARTHROPLASTY Right 07/24/2016   Procedure: COMPUTER ASSISTED TOTAL KNEE ARTHROPLASTY;  Surgeon: Donato HeinzHooten, James P, MD;  Location: [facility] ORS;  Service: Orthopedics;  Laterality: Right;  . TOTAL KNEE ARTHROPLASTY     SOCIAL HISTORY:   Social History  Substance Use Topics  . Smoking status: Former Smoker    Packs/day: 2.00    Years: 0.00    Types: Cigarettes    Quit date: 07/11/1997  . Smokeless tobacco: Never Used  . Alcohol use No   FAMILY HISTORY:  History reviewed. No pertinent family history.  DRUG ALLERGIES:    Allergies  Allergen Reactions  . Statins Other (See Comments)    Muscle pain with atorvastatin and simvastatin   REVIEW OF SYSTEMS:   Review of Systems  Constitutional: Positive for malaise/fatigue. Negative for chills, fever and weight loss.  HENT: Negative for hearing loss and nosebleeds.   Eyes: Negative for blurred vision, double vision and pain.  Respiratory: Negative for cough, hemoptysis, sputum production, shortness of breath and wheezing.   Cardiovascular: Negative for chest pain, palpitations, orthopnea and leg swelling.  Gastrointestinal: Positive for abdominal pain and nausea. Negative for constipation and diarrhea.  Genitourinary: Negative for dysuria and hematuria.  Musculoskeletal: Negative for back pain, falls and myalgias.  Skin: Negative for rash.  Neurological: Positive for weakness. Negative for dizziness, tremors, sensory change, speech change, focal weakness, seizures and headaches.  Endo/Heme/Allergies: Does not bruise/bleed easily.  Psychiatric/Behavioral: Negative for depression and memory loss. The patient is not nervous/anxious.    MEDICATIONS AT HOME:   Prior to Admission medications   Medication Sig Start Date End Date Taking? Authorizing Provider  celecoxib (CELEBREX) 100 MG capsule Take 100 mg by mouth 2 (two) times daily as needed (pain).    [provider]  Cyanocobalamin (VITAMIN B-12 PO) Take 5,000 mcg by mouth daily. Vitamin B12 liquid    [provider]  diphenhydrAMINE (SOMINEX) 25 MG tablet Take 25 mg by mouth at bedtime as needed for sleep.    [provider]  enoxaparin (LOVENOX)  30 MG/0.3ML injection Inject 0.4 mLs (40 mg total) into the skin daily. 07/26/16   Tera Partridge, PA  levothyroxine (SYNTHROID, LEVOTHROID) 88 MCG tablet Take 88 mcg by mouth daily before breakfast.    [provider]  omeprazole (PRILOSEC) 40 MG capsule Take 40 mg by mouth daily as needed (heartburn).     [provider]   oxyCODONE (OXY IR/ROXICODONE) 5 MG immediate release tablet Take 1-2 tablets (5-10 mg total) by mouth every 4 (four) hours as needed for severe pain or breakthrough pain. 07/26/16   Tera Partridge, PA  pravastatin (PRAVACHOL) 80 MG tablet Take 80 mg by mouth daily.    [provider]  traMADol (ULTRAM) 50 MG tablet Take 1-2 tablets (50-100 mg total) by mouth every 4 (four) hours as needed for moderate pain. 07/26/16   Tera Partridge, PA   VITAL SIGNS:  Blood pressure 114/66, pulse 60, temperature 98.1 F (36.7 C), temperature source Oral, resp. rate 18, height 5\' 7"  (1.702 m), weight 87.1 kg (192 lb), SpO2 99 %.  PHYSICAL EXAMINATION:  Physical Exam  GENERAL:  71 y.o.-year-old patient lying in the bed with no acute distress.  EYES: Pupils equal, round, reactive to light and accommodation. positive scleral icterus. Extraocular muscles intact.  HEENT: Head atraumatic, normocephalic. Oropharynx and nasopharynx clear. No oropharyngeal erythema, dry oral mucosa  NECK:  Supple, no jugular venous distention. No thyroid enlargement, no tenderness.  LUNGS: Normal breath sounds bilaterally, no wheezing, rales, rhonchi. No use of accessory muscles of respiration.  CARDIOVASCULAR: S1, S2 normal. No murmurs, rubs, or gallops.  ABDOMEN: soft. Tenderness in right upper quadrant area. Murphy's sign negative. EXTREMITIES: No pedal edema, cyanosis, or clubbing. + 2 pedal & radial pulses b/l.   NEUROLOGIC: Cranial nerves II through XII are intact. No focal Motor or sensory deficits appreciated b/l PSYCHIATRIC: The patient is alert and oriented x 3. Good affect.  SKIN: No obvious rash, lesion, or ulcer.   LABORATORY PANEL:   CBC  Recent Labs Lab 09/06/16 1445  WBC 5.2  HGB 12.5*  HCT 36.7*  PLT 231   ------------------------------------------------------------------------------------------------------------------ Chemistries   Recent Labs Lab 09/06/16 1445  NA 138  K 3.3*  CL 103  CO2 26   GLUCOSE 101*  BUN 17  CREATININE 1.16  CALCIUM 9.4  AST 147*  ALT 250*  ALKPHOS 485*  BILITOT 1.2   ------------------------------------------------------------------------------------------------------------------ Cardiac Enzymes No results for input(s): TROPONINI in the last 168 hours. ------------------------------------------------------------------------------------------------------ RADIOLOGY:  No results found.  IMPRESSION AND PLAN:   * Acute on chronic pancreatitis Lipase significantly elevated at 735. Clear liquid diet. IV fluids.  * Elevated liver enzymes with gallbladder sludge on ultrasound No cholecystitis. Patient has seen GI in the office today. Still continues to have elevated liver enzymes compared to prior blood work. Admit patient. Clear liquid diet. MRCP ordered. Depending on the MRCP results he will either need an ERCP or a HIDA scan and surgical consult.  * hypertension. 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 Lost 60 pounds.  * Hypokalemia. Due to decreased oral intake. We'll replace orally and through IV.  All the records are reviewed and case discussed with ED provider. Management plans discussed with the patient, family and they are in agreement.  CODE STATUS: FULL CODE  TOTAL TIME TAKING CARE OF THIS PATIENT: 40 minutes.   Milagros Loll R M.D on 09/06/2016 at 4:43 PM  Between 7am to 6pm - Pager - [phone]  After 6pm go to [email or web address] - password [email or web address] [facility]  [facility] [place] [facility]  Office  [phone]  CC: Primary care physician; Lynnea Ferrier, MD  Note: This dictation was prepared with Dragon dictation along with smaller phrase technology. Any transcriptional errors that result from this process are unintentional.

## 2016-09-06 NOTE — ED Triage Notes (Signed)
Pt sent from PCP for abnormal lab work, states lipase and liver enzymes elevated, was seen in ED for same thing, awake and alert in no acute distress, states yesterday he had abd pain and nausea

## 2016-09-06 NOTE — ED Provider Notes (Signed)
[facility] Emergency Department Provider Note  ____________________________________________   First MD Initiated Contact with Patient 09/06/16 1528     (approximate)  I have reviewed the triage vital signs and the nursing notes.   HISTORY  Chief Complaint Abnormal Lab    HPI Jared Baldwin is a 71 y.o. male with medical history as listed below who presents at the recommendation of his GI provider for further evaluation of ongoing abdominal pain and elevated LFTs and lipase.  Extensive medical records are available within a [facility] and care everywhere, but in summary, for several weeks the patient has been having more or less constant epigastric and right upper quadrant pain radiating to his back.  It has gotten much worse over the last couple of weeks.  He cannot determine if anything in particular makes it better nor worse, but at times it is severe and at baseline it is mild.  He has had nausea but no vomiting.  He had some loose stools last week but that has improved.  He was seen in this emergency department almost a week ago.  No acute cause was found but he does have an elevated lipase and LFTs with gallbladder sludge.  His pain was well controlled so he was sent to gastroenterology clinic for follow-up.  Last night he reports that the pain was severe and he almost came back into the ED but before he did so he got better on its own and he decided to wait for his GI appointment this morning.  He was evaluated by Harmon Dun in the GI clinic who works with and discussed the case with Dr. Marva Panda.  In short, they are concerned about chronic pancreatitis and gallbladder sludge with signs and symptoms most consistent with a biliary obstruction.  They sent him to the emergency department with the intention of the patient getting further workup including but not limited to HIDA scan versus MRCP and surgical consultation.  The patient denies fever/chills, chest  pain, shortness of breath, vomiting, and urinary symptoms.    Past Medical History:  Diagnosis Date  . Arthritis   . GERD (gastroesophageal reflux disease)   . Hyperlipidemia   . Hypertension   . Hypothyroidism     Patient Active Problem List   Diagnosis Date Noted  . 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 Chest pain 02/19/2012  . SOB (shortness of breath) 02/19/2012  . 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 Chronic back pain 02/19/2012  . Hyperlipidemia   . Hypertension     Past Surgical History:  Procedure Laterality Date  . BACK SURGERY    . CATARACT EXTRACTION W/ INTRAOCULAR LENS  IMPLANT, BILATERAL    . HERNIA REPAIR    . KNEE ARTHROPLASTY Right 07/24/2016   Procedure: COMPUTER ASSISTED TOTAL KNEE ARTHROPLASTY;  Surgeon: Donato Heinz, MD;  Location: [facility] ORS;  Service: Orthopedics;  Laterality: Right;  . TOTAL KNEE ARTHROPLASTY      Prior to Admission medications   Medication Sig Start Date End Date Taking? Authorizing Provider  celecoxib (CELEBREX) 100 MG capsule Take 100 mg by mouth 2 (two) times daily as needed (pain).    [provider]  Cyanocobalamin (VITAMIN B-12 PO) Take 5,000 mcg by mouth daily. Vitamin B12 liquid    [provider]  diphenhydrAMINE (SOMINEX) 25 MG tablet Take 25 mg by mouth at bedtime as needed for sleep.    [provider]  enoxaparin (LOVENOX) 30 MG/0.3ML injection Inject 0.4 mLs (  40 mg total) into the skin daily. 07/26/16   Tera PartridgeWolfe, Jon R, PA  levothyroxine (SYNTHROID, LEVOTHROID) 88 MCG tablet Take 88 mcg by mouth daily before breakfast.    [provider]  omeprazole (PRILOSEC) 40 MG capsule Take 40 mg by mouth daily as needed (heartburn).     [provider]  oxyCODONE (OXY IR/ROXICODONE) 5 MG immediate release tablet Take 1-2 tablets (5-10 mg total) by mouth every 4 (four) hours as needed for severe pain or breakthrough pain. 07/26/16   Tera PartridgeWolfe, Jon R, PA  pravastatin (PRAVACHOL)  80 MG tablet Take 80 mg by mouth daily.    [provider]  traMADol (ULTRAM) 50 MG tablet Take 1-2 tablets (50-100 mg total) by mouth every 4 (four) hours as needed for moderate pain. 07/26/16   Tera PartridgeWolfe, Jon R, PA    Allergies Statins  History reviewed. No pertinent family history.  Social History Social History  Substance Use Topics  . Smoking status: Former Smoker    Packs/day: 2.00    Years: 0.00    Types: Cigarettes    Quit date: 07/11/1997  . Smokeless tobacco: Never Used  . Alcohol use No    Review of Systems Constitutional: No fever/chills Eyes: No visual changes. ENT: No sore throat. Cardiovascular: Denies chest pain. Respiratory: Denies shortness of breath. Gastrointestinal: Ongoing but worsening waxing and waning epigastric and right upper quadrant abdominal pain with nausea Genitourinary: Negative for dysuria. Musculoskeletal: Negative for neck pain.  Negative for back pain. Integumentary: Negative for rash. Neurological: Negative for headaches, focal weakness or numbness.   ____________________________________________   PHYSICAL EXAM:  VITAL SIGNS: ED Triage Vitals  Enc Vitals Group     BP 09/06/16 1442 127/60     Pulse Rate 09/06/16 1442 66     Resp 09/06/16 1442 18     Temp 09/06/16 1442 98.1 F (36.7 C)     Temp Source 09/06/16 1442 Oral     SpO2 09/06/16 1442 100 %     Weight 09/06/16 1443 87.1 kg (192 lb)     Height 09/06/16 1443 1.702 m (5\' 7" )     Head Circumference --      Peak Flow --      Pain Score 09/06/16 1442 4     Pain Loc --      Pain Edu? --      Excl. in GC? --     Constitutional: Alert and oriented. Well appearing and in no acute distress. Eyes: Conjunctivae are normal.  Head: Atraumatic. Nose: No congestion/rhinnorhea. Mouth/Throat: Mucous membranes are moist. Neck: No stridor.  No meningeal signs.   Cardiovascular: Normal rate, regular rhythm. Good peripheral circulation. Grossly normal heart sounds. Respiratory:  Normal respiratory effort.  No retractions. Lungs CTAB. Gastrointestinal: Soft with moderate to severe tenderness to palpation of the right upper quadrant with positive Murphy sign and moderate tenderness of the epigastrium.  No rebound and no guarding.  No lower abdominal tenderness to palpation Musculoskeletal: No lower extremity tenderness nor edema. No gross deformities of extremities. Neurologic:  Normal speech and language. No gross focal neurologic deficits are appreciated.  Skin:  Skin is warm, dry and intact. No rash noted. Psychiatric: Mood and affect are normal. Speech and behavior are normal.  ____________________________________________   LABS (all labs ordered are listed, but only abnormal results are displayed)  Labs Reviewed  LIPASE, BLOOD - Abnormal; Notable for the following:       Result Value   Lipase 735 (*)  All other components within normal limits  COMPREHENSIVE METABOLIC PANEL - Abnormal; Notable for the following:    Potassium 3.3 (*)    Glucose, Bld 101 (*)    AST 147 (*)    ALT 250 (*)    Alkaline Phosphatase 485 (*)    All other components within normal limits  CBC - Abnormal; Notable for the following:    RBC 4.02 (*)    Hemoglobin 12.5 (*)    HCT 36.7 (*)    RDW 15.1 (*)    All other components within normal limits  URINALYSIS, COMPLETE (UACMP) WITH MICROSCOPIC - Abnormal; Notable for the following:    Color, Urine YELLOW (*)    APPearance CLEAR (*)    Squamous Epithelial / LPF 0-5 (*)    All other components within normal limits  COMPREHENSIVE METABOLIC PANEL  CBC  PROTIME-INR   ____________________________________________  EKG  None - EKG not ordered by ED physician ____________________________________________  RADIOLOGY   No results found.  ____________________________________________   PROCEDURES  Critical Care performed: No   Procedure(s) performed:    Procedures   ____________________________________________   INITIAL IMPRESSION / ASSESSMENT AND PLAN / ED COURSE  Pertinent labs & imaging results that were available during my care of the patient were reviewed by me and considered in my medical decision making (see chart for details).  The patient has been seen both in the emergency department and now at his GI specialist to send him back to the hospital.  Their recommendation is for advanced studies that cannot be performed in the emergency department including MRCP and/or HIDA scan.  Currently the patient's pain is relatively well controlled but he does have episodes, including last night, that were severe, and they tended be happening more frequently.   Emergency room surgical consultation in the emergency department is unlikely to be helpful because they will want definitive GI imaging prior to cholecystectomy.  I discussed the case in person with Dr. Elpidio Anis with the hospitalist service who will coordinate between gastroenterology and surgery as well as ordering the appropriate inpatient imaging.  The patient and family understand the plan.  He is not in need of emergent intervention at this time.       ____________________________________________  FINAL CLINICAL IMPRESSION(S) / ED DIAGNOSES  Final diagnoses:  Biliary colic  Acute on chronic pancreatitis (HCC)  RUQ pain  Nausea  Elevated LFTs     MEDICATIONS GIVEN DURING THIS VISIT:  Medications - No data to display   NEW OUTPATIENT MEDICATIONS STARTED DURING THIS VISIT:  New Prescriptions   No medications on file    Modified Medications   No medications on file    Discontinued Medications   No medications on file     Note:  This document was prepared using Dragon voice recognition software and may include unintentional dictation errors.    Loleta Rose, MD 09/06/16 [phone]

## 2016-09-06 NOTE — ED Notes (Signed)
Pt waiting on admission   Iv in place. Pt alert.

## 2016-09-07 DIAGNOSIS — K851 Biliary acute pancreatitis without necrosis or infection: Secondary | ICD-10-CM

## 2016-09-07 LAB — COMPREHENSIVE METABOLIC PANEL
ALK PHOS: 396 U/L — AB (ref 38–126)
ALT: 176 U/L — AB (ref 17–63)
AST: 71 U/L — ABNORMAL HIGH (ref 15–41)
Albumin: 3.4 g/dL — ABNORMAL LOW (ref 3.5–5.0)
Anion gap: 7 (ref 5–15)
BILIRUBIN TOTAL: 1 mg/dL (ref 0.3–1.2)
BUN: 13 mg/dL (ref 6–20)
CALCIUM: 9.1 mg/dL (ref 8.9–10.3)
CHLORIDE: 107 mmol/L (ref 101–111)
CO2: 27 mmol/L (ref 22–32)
CREATININE: 1.06 mg/dL (ref 0.61–1.24)
Glucose, Bld: 88 mg/dL (ref 65–99)
Potassium: 3.4 mmol/L — ABNORMAL LOW (ref 3.5–5.1)
Sodium: 141 mmol/L (ref 135–145)
TOTAL PROTEIN: 6.2 g/dL — AB (ref 6.5–8.1)

## 2016-09-07 LAB — CBC
HCT: 34.9 % — ABNORMAL LOW (ref 40.0–52.0)
Hemoglobin: 12 g/dL — ABNORMAL LOW (ref 13.0–18.0)
MCH: 31.8 pg (ref 26.0–34.0)
MCHC: 34.5 g/dL (ref 32.0–36.0)
MCV: 92.3 fL (ref 80.0–100.0)
PLATELETS: 198 10*3/uL (ref 150–440)
RBC: 3.78 MIL/uL — AB (ref 4.40–5.90)
RDW: 15.1 % — ABNORMAL HIGH (ref 11.5–14.5)
WBC: 5 10*3/uL (ref 3.8–10.6)

## 2016-09-07 LAB — PROTIME-INR
INR: 1.1
PROTHROMBIN TIME: 14.1 s (ref 11.4–15.2)

## 2016-09-07 LAB — LIPASE, BLOOD: Lipase: 241 U/L — ABNORMAL HIGH (ref 11–51)

## 2016-09-07 LAB — MAGNESIUM: MAGNESIUM: 2 mg/dL (ref 1.7–2.4)

## 2016-09-07 MED ORDER — LEVOTHYROXINE SODIUM 88 MCG PO TABS
88.0000 ug | ORAL_TABLET | Freq: Every day | ORAL | Status: DC
  Administered 2016-09-07 – 2016-09-08 (×2): 88 ug via ORAL
  Filled 2016-09-07 (×3): qty 1

## 2016-09-07 MED ORDER — POTASSIUM CHLORIDE CRYS ER 20 MEQ PO TBCR
20.0000 meq | EXTENDED_RELEASE_TABLET | Freq: Once | ORAL | Status: AC
  Administered 2016-09-07: 20 meq via ORAL
  Filled 2016-09-07: qty 1

## 2016-09-07 MED ORDER — GADOBENATE DIMEGLUMINE 529 MG/ML IV SOLN
20.0000 mL | Freq: Once | INTRAVENOUS | Status: AC | PRN
  Administered 2016-09-07: 18 mL via INTRAVENOUS

## 2016-09-07 NOTE — Consult Note (Signed)
Patient ID: Jared Bodeete Pasquarelli, male   DOB: [birth year], 71 y.o.   MRN: [id number]  CC: Abdominal pain  HPI Jared Baldwin is a 71 y.o. male who is currently admitted to the hospitalist service. General surgery consult requested by Dr. Elpidio AnisSudini for evaluation of pancreatitis of a likely biliary source. Patient reports she's been having intermittent pains for the past several days including the several days prior to admission. The pain is always in his upper abdomen primarily on the right side. It worsened on the day of presentation when he was sent to the ER from his GI specialist. Patient was found to have pancreatitis and was admitted to the hospitalist. Patient states he's feeling somewhat better since admission but the pain persists. He is tolerating a clear liquid diet currently. He has had some nausea but denies any vomiting. He's also had some generalized malaise but he denies any fevers, chills, chest pain, shortness of breath, diarrhea, constipation. He denies any recent sick contacts or changes in his health otherwise.  HPI  Past Medical History:  Diagnosis Date  . Arthritis   . GERD (gastroesophageal reflux disease)   . Hyperlipidemia   . Hypertension   . Hypothyroidism     Past Surgical History:  Procedure Laterality Date  . BACK SURGERY    . CATARACT EXTRACTION W/ INTRAOCULAR LENS  IMPLANT, BILATERAL    . HERNIA REPAIR    . KNEE ARTHROPLASTY Right 07/24/2016   Procedure: COMPUTER ASSISTED TOTAL KNEE ARTHROPLASTY;  Surgeon: Donato HeinzHooten, James P, MD;  Location: [facility] ORS;  Service: Orthopedics;  Laterality: Right;  . TOTAL KNEE ARTHROPLASTY    [place]  Family history: Mother with history of stroke, sister with high blood pressure, maternal grandmother with diabetes. No known history of cancers.  Social History Social History  Substance Use Topics  . Smoking status: Former Smoker    Packs/day: 2.00    Years: 0.00    Types: Cigarettes     Quit date: 07/11/1997  . Smokeless tobacco: Never Used  . Alcohol use No    Allergies  Allergen Reactions  . Statins Other (See Comments)    Muscle pain with atorvastatin and simvastatin    Current Facility-Administered Medications  Medication Dose Route Frequency Provider Last Rate Last Dose  . 0.9 % NaCl with KCl 20 mEq/ L  infusion   Intravenous Continuous Milagros LollSudini, Srikar, MD 100 mL/hr at 09/07/16 0510    . acetaminophen (TYLENOL) tablet 650 mg  650 mg Oral Q6H PRN Milagros LollSudini, Srikar, MD       Or  . acetaminophen (TYLENOL) suppository 650 mg  650 mg Rectal Q6H PRN Sudini, Srikar, MD      . albuterol (PROVENTIL) (2.5 MG/3ML) 0.083% nebulizer solution 2.5 mg  2.5 mg Nebulization Q2H PRN Sudini, Srikar, MD      . enoxaparin (LOVENOX) injection 40 mg  40 mg Subcutaneous Q24H Milagros LollSudini, Srikar, MD   40 mg at 09/06/16 2036  . levothyroxine (SYNTHROID, LEVOTHROID) tablet 88 mcg  88 mcg Oral QAC breakfast Altamese DillingVachhani, Vaibhavkumar, MD   88 mcg at 09/07/16 1128  . morphine 2 MG/ML injection 2 mg  2 mg Intravenous Q4H PRN Sudini, Wardell HeathSrikar, MD      . ondansetron (ZOFRAN) tablet 4 mg  4 mg Oral Q6H PRN Sudini, Wardell HeathSrikar, MD       Or  . ondansetron (ZOFRAN) injection 4 mg  4 mg Intravenous Q6H PRN Milagros LollSudini, Srikar, MD      .  oxyCODONE (Oxy IR/ROXICODONE) immediate release tablet 5 mg  5 mg Oral Q4H PRN Milagros Loll, MD   5 mg at 09/06/16 2036  . polyethylene glycol (MIRALAX / GLYCOLAX) packet 17 g  17 g Oral Daily PRN Sudini, Wardell Heath, MD         Review of Systems A Multi-point review of systems was asked and was negative except for the findings documented in the history of present illness  Physical Exam Blood pressure (!) 142/73, pulse (!) 59, temperature 98.1 F (36.7 C), temperature source Oral, resp. rate 18, height  (1.702 m), weight 84.2 kg (185 lb 9.6 oz), SpO2 97 %. CONSTITUTIONAL: Resting in bed in no acute distress. EYES: Pupils are equal, round, and reactive to light, Sclera are  non-icteric. EARS, NOSE, MOUTH AND THROAT: The oropharynx is clear. The oral mucosa is pink and moist. Hearing is intact to voice. LYMPH NODES:  Lymph nodes in the neck are normal. RESPIRATORY:  Lungs are clear. There is normal respiratory effort, with equal breath sounds bilaterally, and without pathologic use of accessory muscles. CARDIOVASCULAR: Heart is regular without murmurs, gallops, or rubs. GI: The abdomen is soft, mildly tender to deep palpation in the upper midline, and nondistended. There is evidence of an umbilical hernia. There is no hepatosplenomegaly. There are normal bowel sounds in all quadrants. GU: Rectal deferred.   MUSCULOSKELETAL: Normal muscle strength and tone. No cyanosis or edema.   SKIN: Turgor is good and there are no pathologic skin lesions or ulcers. NEUROLOGIC: Motor and sensation is grossly normal. Cranial nerves are grossly intact. PSYCH:  Oriented to person, place and time. Affect is normal.  Data Reviewed Images and labs reviewed which show persistently elevated lipase currently at 241 down from 735. Multiple elevations in transaminases including an elevated alkaline phosphatase 396. Normal white blood cell count of 5.0. CT scan, ultrasound, MRI reviewed. Shows evidence of calcification of the pancreas which may be her presented of of chronic pancreatitis. Also shows evidence of cholelithiasis without cholecystitis. I have personally reviewed the patient's imaging, laboratory findings and medical records.    Assessment    Pancreatitis    Plan    71 year old male with pancreatitis and cholelithiasis. Discussed with the patient that most likely cause of his pancreatitis is of a biliary source. Discussed that surgery to remove the gallbladder could prevent further bouts of pancreatitis. However, discussed that the optimal time for surgery is after the pancreatitis resolves. Discussed that the current plan would be for continued IV resuscitation until his labs  are completely normal and his pain has completely resolved. At that time the procedure a laparoscopic cholecystectomy would be described in detail. Given he also has an umbilical hernia discussed that it could be sutured closed at the same time as the gallbladder removal. Patient voiced understanding and appreciated the information. General surgery will continue to follow with you. Once his pain is gone and his labs were normal we will plan a laparoscopic cholecystectomy.     Time spent with the patient was 80 minutes, with more than 50% of the time spent in face-to-face education, counseling and care coordination.     Ricarda Frame, MD FACS General Surgeon 09/07/2016, 9:24 PM

## 2016-09-07 NOTE — Progress Notes (Signed)
[employer] [facility] [place] at [facility]   PATIENT NAME: Jared Baldwin    MR#:  [id number]  DATE OF BIRTH:  [birth year]  SUBJECTIVE:  CHIEF COMPLAINT:   Chief Complaint  Patient presents with  . Abnormal Lab  sent from Dr. Orpah Cobb office for elevated LFTs, pancreatitis. Had MRCP done, no cholecystitis or bile duct stone.  REVIEW OF SYSTEMS:  CONSTITUTIONAL: No fever, fatigue or weakness.  EYES: No blurred or double vision.  EARS, NOSE, AND THROAT: No tinnitus or ear pain.  RESPIRATORY: No cough, shortness of breath, wheezing or hemoptysis.  CARDIOVASCULAR: No chest pain, orthopnea, edema.  GASTROINTESTINAL: No nausea, vomiting, diarrhea , have abdominal pain.  GENITOURINARY: No dysuria, hematuria.  ENDOCRINE: No polyuria, nocturia,  HEMATOLOGY: No anemia, easy bruising or bleeding SKIN: No rash or lesion. MUSCULOSKELETAL: No joint pain or arthritis.   NEUROLOGIC: No tingling, numbness, weakness.  PSYCHIATRY: No anxiety or depression.   ROS  DRUG ALLERGIES:   Allergies  Allergen Reactions  . Statins Other (See Comments)    Muscle pain with atorvastatin and simvastatin    VITALS:  Blood pressure (!) 142/73, pulse (!) 59, temperature 98.1 F (36.7 C), temperature source Oral, resp. rate 18, height  (1.702 m), weight 84.2 kg (185 lb 9.6 oz), SpO2 97 %.  PHYSICAL EXAMINATION:  GENERAL:  71 y.o.-year-old patient lying in the bed with no acute distress.  EYES: Pupils equal, round, reactive to light and accommodation. No scleral icterus. Extraocular muscles intact.  HEENT: Head atraumatic, normocephalic. Oropharynx and nasopharynx clear.  NECK:  Supple, no jugular venous distention. No thyroid enlargement, no tenderness.  LUNGS: Normal breath sounds bilaterally, no wheezing, rales,rhonchi or crepitation. No use of accessory muscles of respiration.  CARDIOVASCULAR: S1, S2 normal. No murmurs, rubs, or gallops.  ABDOMEN: Soft, epigastric and RUQ tender,  nondistended. Bowel sounds present. No organomegaly or mass.  EXTREMITIES: No pedal edema, cyanosis, or clubbing.  NEUROLOGIC: Cranial nerves II through XII are intact. Muscle strength 5/5 in all extremities. Sensation intact. Gait not checked.  PSYCHIATRIC: The patient is alert and oriented x 3.  SKIN: No obvious rash, lesion, or ulcer.   Physical Exam LABORATORY PANEL:   CBC  Recent Labs Lab 09/07/16 0346  WBC 5.0  HGB 12.0*  HCT 34.9*  PLT 198   ------------------------------------------------------------------------------------------------------------------  Chemistries   Recent Labs Lab 09/07/16 0346  NA 141  K 3.4*  CL 107  CO2 27  GLUCOSE 88  BUN 13  CREATININE 1.06  CALCIUM 9.1  MG 2.0  AST 71*  ALT 176*  ALKPHOS 396*  BILITOT 1.0   ------------------------------------------------------------------------------------------------------------------  Cardiac Enzymes No results for input(s): TROPONINI in the last 168 hours. ------------------------------------------------------------------------------------------------------------------  RADIOLOGY:  Mr 3d Recon At Scanner  Result Date: 09/07/2016 CLINICAL DATA:  Abnormal liver function tests. Abdominal pain and nausea. Ultrasound demonstrated gallbladder sludge without cholecystitis. CT findings consistent with chronic pancreatitis. EXAM: MRI ABDOMEN WITHOUT AND WITH CONTRAST (INCLUDING MRCP) TECHNIQUE: Multiplanar multisequence MR imaging of the abdomen was performed both before and after the administration of intravenous contrast. Heavily T2-weighted images of the biliary and pancreatic ducts were obtained, and three-dimensional MRCP images were rendered by post processing. CONTRAST:  18mL MULTIHANCE GADOBENATE DIMEGLUMINE 529 MG/ML IV SOLN COMPARISON:  CT abdomen and pelvis 08/31/2016. Ultrasound abdomen 08/24/2016 FINDINGS: Lower chest: No acute findings. Hepatobiliary: Heterogeneous T2 signal intensity within  the gallbladder consistent with gallbladder sludge. 2 small stones are demonstrated, 1 in the gallbladder neck and 1 in the  body of the gallbladder. No gallbladder wall thickening or edema. No bile duct dilatation. No focal liver lesions identified. Pancreas: Pancreatic parenchyma is homogeneous. No pancreatic ductal dilatation or inflammatory change. No peripancreatic fluid collections. Spleen:  Within normal limits in size and appearance. Adrenals/Urinary Tract: Bilateral renal cysts. No hydronephrosis. No solid mass lesions. Stomach/Bowel: Postoperative changes consistent with gastric sleeve procedure. Small esophageal hiatal hernia. Visualized small bowel and colon are decompressed. Vascular/Lymphatic: No pathologically enlarged lymph nodes identified. No abdominal aortic aneurysm demonstrated. Other: No free air or free fluid demonstrated in the abdomen. No abnormal enhancing masses. Musculoskeletal: Degenerative changes in the spine. No focal bone lesions identified. IMPRESSION: 1. Sludge filled gallbladder with small stones in the gallbladder fundus and gallbladder neck. No inflammatory changes to suggest cholecystitis. 2. No bile duct dilatation. 3. Postoperative gastric sleeve procedure. 4. Benign-appearing bilateral renal cysts. Electronically Signed   By: Burman NievesWilliam  Stevens M.D.   On: 09/07/2016 02:10   Mr Abdomen Mrcp Vivien RossettiW Wo Contast  Result Date: 09/07/2016 CLINICAL DATA:  Abnormal liver function tests. Abdominal pain and nausea. Ultrasound demonstrated gallbladder sludge without cholecystitis. CT findings consistent with chronic pancreatitis. EXAM: MRI ABDOMEN WITHOUT AND WITH CONTRAST (INCLUDING MRCP) TECHNIQUE: Multiplanar multisequence MR imaging of the abdomen was performed both before and after the administration of intravenous contrast. Heavily T2-weighted images of the biliary and pancreatic ducts were obtained, and three-dimensional MRCP images were rendered by post processing. CONTRAST:  18mL  MULTIHANCE GADOBENATE DIMEGLUMINE 529 MG/ML IV SOLN COMPARISON:  CT abdomen and pelvis 08/31/2016. Ultrasound abdomen 08/24/2016 FINDINGS: Lower chest: No acute findings. Hepatobiliary: Heterogeneous T2 signal intensity within the gallbladder consistent with gallbladder sludge. 2 small stones are demonstrated, 1 in the gallbladder neck and 1 in the body of the gallbladder. No gallbladder wall thickening or edema. No bile duct dilatation. No focal liver lesions identified. Pancreas: Pancreatic parenchyma is homogeneous. No pancreatic ductal dilatation or inflammatory change. No peripancreatic fluid collections. Spleen:  Within normal limits in size and appearance. Adrenals/Urinary Tract: Bilateral renal cysts. No hydronephrosis. No solid mass lesions. Stomach/Bowel: Postoperative changes consistent with gastric sleeve procedure. Small esophageal hiatal hernia. Visualized small bowel and colon are decompressed. Vascular/Lymphatic: No pathologically enlarged lymph nodes identified. No abdominal aortic aneurysm demonstrated. Other: No free air or free fluid demonstrated in the abdomen. No abnormal enhancing masses. Musculoskeletal: Degenerative changes in the spine. No focal bone lesions identified. IMPRESSION: 1. Sludge filled gallbladder with small stones in the gallbladder fundus and gallbladder neck. No inflammatory changes to suggest cholecystitis. 2. No bile duct dilatation. 3. Postoperative gastric sleeve procedure. 4. Benign-appearing bilateral renal cysts. Electronically Signed   By: Burman NievesWilliam  Stevens M.D.   On: 09/07/2016 02:10    ASSESSMENT AND PLAN:   Principal Problem:   Pancreatitis  * Acute on chronic pancreatitis Lipase significantly elevated at 735. Clear liquid diet. IV fluids.- likely due to passing stone.  * Elevated liver enzymes with gallbladder sludge on ultrasound No cholecystitis. Patient has seen GI in the office today. Still continues to have elevated liver enzymes compared to  prior blood work. Clear liquid diet. MRCP does not show stone in the duct, Surgical consult to help for Gall bladder removal.  * hypertension. 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 Lost 60 pounds.  * Hypokalemia. Due to decreased oral intake. We'll replace orally and through IV.     All the records are reviewed and case discussed with Care Management/Social Workerr. Management plans discussed with the patient,  family and they are in agreement.  CODE STATUS: full.  TOTAL TIME TAKING CARE OF THIS PATIENT: 35 minutes.     POSSIBLE D/C IN 2-3 DAYS, DEPENDING ON CLINICAL CONDITION.   Altamese Dilling M.D on 09/07/2016   Between 7am to 6pm - Pager - [phone]  After 6pm go to [email or web address] - password [employer]  [facility]  Office  [phone]  CC: Primary care physician; Lynnea Ferrier, MD  Note: This dictation was prepared with Dragon dictation along with smaller phrase technology. Any transcriptional errors that result from this process are unintentional.

## 2016-09-08 LAB — COMPREHENSIVE METABOLIC PANEL
ALBUMIN: 3.3 g/dL — AB (ref 3.5–5.0)
ALK PHOS: 329 U/L — AB (ref 38–126)
ALT: 121 U/L — AB (ref 17–63)
AST: 35 U/L (ref 15–41)
Anion gap: 6 (ref 5–15)
BILIRUBIN TOTAL: 0.8 mg/dL (ref 0.3–1.2)
BUN: 8 mg/dL (ref 6–20)
CALCIUM: 9.1 mg/dL (ref 8.9–10.3)
CO2: 26 mmol/L (ref 22–32)
Chloride: 108 mmol/L (ref 101–111)
Creatinine, Ser: 1.03 mg/dL (ref 0.61–1.24)
GFR calc Af Amer: >60 mL/min (ref >60)
GFR calc non Af Amer: >60 mL/min (ref >60)
GLUCOSE: 87 mg/dL (ref 65–99)
Potassium: 3.8 mmol/L (ref 3.5–5.1)
SODIUM: 140 mmol/L (ref 135–145)
TOTAL PROTEIN: 6.3 g/dL — AB (ref 6.5–8.1)

## 2016-09-08 LAB — LIPASE, BLOOD: Lipase: 68 U/L — ABNORMAL HIGH (ref 11–51)

## 2016-09-08 MED ORDER — DEXTROSE 5 % IV SOLN
2.0000 g | INTRAVENOUS | Status: AC
  Administered 2016-09-09: 2 g via INTRAVENOUS
  Filled 2016-09-08: qty 2

## 2016-09-08 NOTE — Progress Notes (Signed)
09/08/2016  Subjective: No acute events overnight.  Pt reports only very mild pain that is improved compared to yesterday.  His lipase is also better this morning.  Tolerated clears, denies any nausea/vomiting.  Vital signs: Temp:  [98.1 F (36.7 C)] 98.1 F (36.7 C) (09/06 2023) Pulse Rate:  [59] 59 (09/06 2023) Resp:  [18] 18 (09/06 2023) BP: (142)/(73) 142/73 (09/06 2023) SpO2:  [97 %] 97 % (09/06 2023) Weight:  [85.4 kg (188 lb 4.4 oz)] 85.4 kg (188 lb 4.4 oz) (09/07 0445)   Intake/Output: 09/06 0701 - 09/07 0700 In: 4615 [P.O.:2500; I.V.:2115] Out: 1000 [Urine:1000] Last BM Date: 09/07/16  Physical Exam: Constitutional: No acute distress Abdomen:  Soft, nondistended, with minimal tenderness to palpation over the epigastric and right upper quadrant areas.  Labs:   Recent Labs  09/06/16 1445 09/07/16 0346  WBC 5.2 5.0  HGB 12.5* 12.0*  HCT 36.7* 34.9*  PLT 231 198    Recent Labs  09/07/16 0346 09/08/16 0400  NA 141 140  K 3.4* 3.8  CL 107 108  CO2 27 26  GLUCOSE 88 87  BUN 13 8  CREATININE 1.06 1.03  CALCIUM 9.1 9.1    Recent Labs  09/07/16 0346  LABPROT 14.1  INR 1.10    Imaging: No results found.  Assessment/Plan: 71 yo male with gallstone pancreatitis  --Discussed with patient role for cholecystectomy.  Will plan for surgery tomorrow as long as he continues to improve and his labs continued to improve as well.  Would plan for intraoperative cholangiogram as well.  Risks of the procedure including bleeding, infection, and injury to surrounding structures were discussed with the patient. --The patient also has a very small umbilical hernia that we will repair at time of surgery. --NPO after midnight.   Howie IllJose Luis Siraj Dermody, MD [facility]

## 2016-09-08 NOTE — Progress Notes (Signed)
[employer] [facility] [place] at [facility]   PATIENT NAME: Jared Baldwin    MR#:  [id number]  DATE OF BIRTH:  August 29, 1945  SUBJECTIVE:  CHIEF COMPLAINT:   Chief Complaint  Patient presents with  . Abnormal Lab  sent from Dr. Orpah Cobb office for elevated LFTs, pancreatitis. Had MRCP done, no cholecystitis or bile duct stone. Pain is better, tolerating liquid diet.  REVIEW OF SYSTEMS:  CONSTITUTIONAL: No fever, fatigue or weakness.  EYES: No blurred or double vision.  EARS, NOSE, AND THROAT: No tinnitus or ear pain.  RESPIRATORY: No cough, shortness of breath, wheezing or hemoptysis.  CARDIOVASCULAR: No chest pain, orthopnea, edema.  GASTROINTESTINAL: No nausea, vomiting, diarrhea , have abdominal pain.  GENITOURINARY: No dysuria, hematuria.  ENDOCRINE: No polyuria, nocturia,  HEMATOLOGY: No anemia, easy bruising or bleeding SKIN: No rash or lesion. MUSCULOSKELETAL: No joint pain or arthritis.   NEUROLOGIC: No tingling, numbness, weakness.  PSYCHIATRY: No anxiety or depression.   ROS  DRUG ALLERGIES:   Allergies  Allergen Reactions  . Statins Other (See Comments)    Muscle pain with atorvastatin and simvastatin    VITALS:  Blood pressure (!) 142/73, pulse (!) 59, temperature 98.1 F (36.7 C), temperature source Oral, resp. rate 18, height  (1.702 m), weight 85.4 kg (188 lb 4.4 oz), SpO2 97 %.  PHYSICAL EXAMINATION:  GENERAL:  71 y.o.-year-old patient lying in the bed with no acute distress.  EYES: Pupils equal, round, reactive to light and accommodation. No scleral icterus. Extraocular muscles intact.  HEENT: Head atraumatic, normocephalic. Oropharynx and nasopharynx clear.  NECK:  Supple, no jugular venous distention. No thyroid enlargement, no tenderness.  LUNGS: Normal breath sounds bilaterally, no wheezing, rales,rhonchi or crepitation. No use of accessory muscles of respiration.  CARDIOVASCULAR: S1, S2 normal. No murmurs, rubs, or gallops.  ABDOMEN:  Soft, epigastric and RUQ tender, nondistended. Bowel sounds present. No organomegaly or mass.  EXTREMITIES: No pedal edema, cyanosis, or clubbing.  NEUROLOGIC: Cranial nerves II through XII are intact. Muscle strength 5/5 in all extremities. Sensation intact. Gait not checked.  PSYCHIATRIC: The patient is alert and oriented x 3.  SKIN: No obvious rash, lesion, or ulcer.   Physical Exam LABORATORY PANEL:   CBC  Recent Labs Lab 09/07/16 0346  WBC 5.0  HGB 12.0*  HCT 34.9*  PLT 198   ------------------------------------------------------------------------------------------------------------------  Chemistries   Recent Labs Lab 09/07/16 0346 09/08/16 0400  NA 141 140  K 3.4* 3.8  CL 107 108  CO2 27 26  GLUCOSE 88 87  BUN 13 8  CREATININE 1.06 1.03  CALCIUM 9.1 9.1  MG 2.0  --   AST 71* 35  ALT 176* 121*  ALKPHOS 396* 329*  BILITOT 1.0 0.8   ------------------------------------------------------------------------------------------------------------------  Cardiac Enzymes No results for input(s): TROPONINI in the last 168 hours. ------------------------------------------------------------------------------------------------------------------  RADIOLOGY:  Mr 3d Recon At Scanner  Result Date: 09/07/2016 CLINICAL DATA:  Abnormal liver function tests. Abdominal pain and nausea. Ultrasound demonstrated gallbladder sludge without cholecystitis. CT findings consistent with chronic pancreatitis. EXAM: MRI ABDOMEN WITHOUT AND WITH CONTRAST (INCLUDING MRCP) TECHNIQUE: Multiplanar multisequence MR imaging of the abdomen was performed both before and after the administration of intravenous contrast. Heavily T2-weighted images of the biliary and pancreatic ducts were obtained, and three-dimensional MRCP images were rendered by post processing. CONTRAST:  18mL MULTIHANCE GADOBENATE DIMEGLUMINE 529 MG/ML IV SOLN COMPARISON:  CT abdomen and pelvis 08/31/2016. Ultrasound abdomen 08/24/2016  FINDINGS: Lower chest: No acute findings. Hepatobiliary: Heterogeneous T2  signal intensity within the gallbladder consistent with gallbladder sludge. 2 small stones are demonstrated, 1 in the gallbladder neck and 1 in the body of the gallbladder. No gallbladder wall thickening or edema. No bile duct dilatation. No focal liver lesions identified. Pancreas: Pancreatic parenchyma is homogeneous. No pancreatic ductal dilatation or inflammatory change. No peripancreatic fluid collections. Spleen:  Within normal limits in size and appearance. Adrenals/Urinary Tract: Bilateral renal cysts. No hydronephrosis. No solid mass lesions. Stomach/Bowel: Postoperative changes consistent with gastric sleeve procedure. Small esophageal hiatal hernia. Visualized small bowel and colon are decompressed. Vascular/Lymphatic: No pathologically enlarged lymph nodes identified. No abdominal aortic aneurysm demonstrated. Other: No free air or free fluid demonstrated in the abdomen. No abnormal enhancing masses. Musculoskeletal: Degenerative changes in the spine. No focal bone lesions identified. IMPRESSION: 1. Sludge filled gallbladder with small stones in the gallbladder fundus and gallbladder neck. No inflammatory changes to suggest cholecystitis. 2. No bile duct dilatation. 3. Postoperative gastric sleeve procedure. 4. Benign-appearing bilateral renal cysts. Electronically Signed   By: Burman Nieves M.D.   On: 09/07/2016 02:10   Mr Abdomen Mrcp Vivien Rossetti Contast  Result Date: 09/07/2016 CLINICAL DATA:  Abnormal liver function tests. Abdominal pain and nausea. Ultrasound demonstrated gallbladder sludge without cholecystitis. CT findings consistent with chronic pancreatitis. EXAM: MRI ABDOMEN WITHOUT AND WITH CONTRAST (INCLUDING MRCP) TECHNIQUE: Multiplanar multisequence MR imaging of the abdomen was performed both before and after the administration of intravenous contrast. Heavily T2-weighted images of the biliary and pancreatic ducts  were obtained, and three-dimensional MRCP images were rendered by post processing. CONTRAST:  18mL MULTIHANCE GADOBENATE DIMEGLUMINE 529 MG/ML IV SOLN COMPARISON:  CT abdomen and pelvis 08/31/2016. Ultrasound abdomen 08/24/2016 FINDINGS: Lower chest: No acute findings. Hepatobiliary: Heterogeneous T2 signal intensity within the gallbladder consistent with gallbladder sludge. 2 small stones are demonstrated, 1 in the gallbladder neck and 1 in the body of the gallbladder. No gallbladder wall thickening or edema. No bile duct dilatation. No focal liver lesions identified. Pancreas: Pancreatic parenchyma is homogeneous. No pancreatic ductal dilatation or inflammatory change. No peripancreatic fluid collections. Spleen:  Within normal limits in size and appearance. Adrenals/Urinary Tract: Bilateral renal cysts. No hydronephrosis. No solid mass lesions. Stomach/Bowel: Postoperative changes consistent with gastric sleeve procedure. Small esophageal hiatal hernia. Visualized small bowel and colon are decompressed. Vascular/Lymphatic: No pathologically enlarged lymph nodes identified. No abdominal aortic aneurysm demonstrated. Other: No free air or free fluid demonstrated in the abdomen. No abnormal enhancing masses. Musculoskeletal: Degenerative changes in the spine. No focal bone lesions identified. IMPRESSION: 1. Sludge filled gallbladder with small stones in the gallbladder fundus and gallbladder neck. No inflammatory changes to suggest cholecystitis. 2. No bile duct dilatation. 3. Postoperative gastric sleeve procedure. 4. Benign-appearing bilateral renal cysts. Electronically Signed   By: Burman Nieves M.D.   On: 09/07/2016 02:10    ASSESSMENT AND PLAN:   Principal Problem:   Pancreatitis  * Acute on chronic pancreatitis Lipase significantly elevated at 735. Clear liquid diet. IV fluids.- likely due to passing stone.  getting better.  * Elevated liver enzymes with gallbladder sludge on ultrasound No  cholecystitis. Patient has seen GI in the office today. Still continues to have elevated liver enzymes compared to prior blood work. Clear liquid diet. MRCP does not show stone in the duct, Surgical consult to help for Gall bladder removal.  plan for cholecystectomy tomorrow.  * hypertension. 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 Lost 60 pounds.  * Hypokalemia. Due to  decreased oral intake. We'll replace orally and through IV.     All the records are reviewed and case discussed with Care Management/Social Workerr. Management plans discussed with the patient, family and they are in agreement.  CODE STATUS: full.  TOTAL TIME TAKING CARE OF THIS PATIENT: 35 minutes.     POSSIBLE D/C IN 2-3 DAYS, DEPENDING ON CLINICAL CONDITION.   Altamese DillingVACHHANI, Candice Lunney M.D on 09/08/2016   Between 7am to 6pm - Pager - [phone]  After 6pm go to [email or web address] - password [email or web address]  [facility] [place] [facility]  Office  [phone]  CC: Primary care physician; Lynnea FerrierKlein, Bert J III, MD  Note: This dictation was prepared with Dragon dictation along with smaller phrase technology. Any transcriptional errors that result from this process are unintentional.

## 2016-09-09 ENCOUNTER — Inpatient Hospital Stay: Payer: PPO

## 2016-09-09 ENCOUNTER — Inpatient Hospital Stay: Payer: PPO | Admitting: Anesthesiology

## 2016-09-09 ENCOUNTER — Encounter: Payer: Self-pay | Admitting: Anesthesiology

## 2016-09-09 ENCOUNTER — Encounter
Admission: EM | Disposition: A | Discharge status: Home / Self Care | Payer: Self-pay | Source: Home / Self Care | Attending: Internal Medicine

## 2016-09-09 DIAGNOSIS — K429 Umbilical hernia without obstruction or gangrene: Secondary | ICD-10-CM

## 2016-09-09 HISTORY — PX: CHOLECYSTECTOMY: SHX55

## 2016-09-09 HISTORY — PX: UMBILICAL HERNIA REPAIR: SHX196

## 2016-09-09 LAB — COMPREHENSIVE METABOLIC PANEL
ALBUMIN: 3.2 g/dL — AB (ref 3.5–5.0)
ALK PHOS: 306 U/L — AB (ref 38–126)
ALT: 95 U/L — AB (ref 17–63)
AST: 28 U/L (ref 15–41)
Anion gap: 5 (ref 5–15)
BUN: 6 mg/dL (ref 6–20)
CALCIUM: 8.8 mg/dL — AB (ref 8.9–10.3)
CO2: 26 mmol/L (ref 22–32)
CREATININE: 0.94 mg/dL (ref 0.61–1.24)
Chloride: 109 mmol/L (ref 101–111)
GFR calc Af Amer: >60 mL/min (ref >60)
GFR calc non Af Amer: >60 mL/min (ref >60)
Glucose, Bld: 83 mg/dL (ref 65–99)
Potassium: 4 mmol/L (ref 3.5–5.1)
SODIUM: 140 mmol/L (ref 135–145)
Total Bilirubin: 0.7 mg/dL (ref 0.3–1.2)
Total Protein: 5.9 g/dL — ABNORMAL LOW (ref 6.5–8.1)

## 2016-09-09 LAB — LIPASE, BLOOD: LIPASE: 40 U/L (ref 11–51)

## 2016-09-09 SURGERY — LAPAROSCOPIC CHOLECYSTECTOMY WITH INTRAOPERATIVE CHOLANGIOGRAM
Anesthesia: General

## 2016-09-09 MED ORDER — FENTANYL CITRATE (PF) 100 MCG/2ML IJ SOLN
INTRAMUSCULAR | Status: AC
  Filled 2016-09-09: qty 2

## 2016-09-09 MED ORDER — OXYCODONE HCL 5 MG PO TABS: 5.0000 mg | ORAL_TABLET | Freq: Once | ORAL | Status: DC | PRN

## 2016-09-09 MED ORDER — DEXAMETHASONE SODIUM PHOSPHATE 10 MG/ML IJ SOLN
INTRAMUSCULAR | Status: DC | PRN
  Administered 2016-09-09: 4 mg via INTRAVENOUS

## 2016-09-09 MED ORDER — LEVOTHYROXINE SODIUM 88 MCG PO TABS
88.0000 ug | ORAL_TABLET | Freq: Every day | ORAL | Status: DC
  Administered 2016-09-10: 88 ug via ORAL
  Filled 2016-09-09: qty 1

## 2016-09-09 MED ORDER — LIDOCAINE HCL (CARDIAC) 20 MG/ML IV SOLN
INTRAVENOUS | Status: DC | PRN
  Administered 2016-09-09: 40 mg via INTRAVENOUS

## 2016-09-09 MED ORDER — OXYCODONE HCL 5 MG/5ML PO SOLN: 5.0000 mg | Freq: Once | ORAL | Status: DC | PRN

## 2016-09-09 MED ORDER — DEXAMETHASONE SODIUM PHOSPHATE 10 MG/ML IJ SOLN
INTRAMUSCULAR | Status: AC
  Filled 2016-09-09: qty 1

## 2016-09-09 MED ORDER — FENTANYL CITRATE (PF) 100 MCG/2ML IJ SOLN
25.0000 ug | INTRAMUSCULAR | Status: DC | PRN
  Administered 2016-09-09 (×4): 25 ug via INTRAVENOUS

## 2016-09-09 MED ORDER — KETOROLAC TROMETHAMINE 30 MG/ML IJ SOLN
INTRAMUSCULAR | Status: AC
  Filled 2016-09-09: qty 1

## 2016-09-09 MED ORDER — LIDOCAINE HCL (PF) 2 % IJ SOLN
INTRAMUSCULAR | Status: AC
  Filled 2016-09-09: qty 2

## 2016-09-09 MED ORDER — ONDANSETRON HCL 4 MG/2ML IJ SOLN
INTRAMUSCULAR | Status: AC
  Filled 2016-09-09: qty 2

## 2016-09-09 MED ORDER — SUGAMMADEX SODIUM 200 MG/2ML IV SOLN
INTRAVENOUS | Status: AC
  Filled 2016-09-09: qty 2

## 2016-09-09 MED ORDER — SUCCINYLCHOLINE CHLORIDE 20 MG/ML IJ SOLN
INTRAMUSCULAR | Status: AC
  Filled 2016-09-09: qty 1

## 2016-09-09 MED ORDER — FENTANYL CITRATE (PF) 100 MCG/2ML IJ SOLN
INTRAMUSCULAR | Status: DC | PRN
  Administered 2016-09-09 (×2): 50 ug via INTRAVENOUS
  Administered 2016-09-09: 100 ug via INTRAVENOUS
  Administered 2016-09-09: 50 ug via INTRAVENOUS

## 2016-09-09 MED ORDER — KETOROLAC TROMETHAMINE 30 MG/ML IJ SOLN
INTRAMUSCULAR | Status: DC | PRN
  Administered 2016-09-09: 15 mg via INTRAVENOUS

## 2016-09-09 MED ORDER — PROPOFOL 10 MG/ML IV BOLUS
INTRAVENOUS | Status: DC | PRN
  Administered 2016-09-09: 150 mg via INTRAVENOUS

## 2016-09-09 MED ORDER — SODIUM CHLORIDE 0.9 % IV SOLN
INTRAVENOUS | Status: DC | PRN
  Administered 2016-09-09: 36 mL

## 2016-09-09 MED ORDER — FENTANYL CITRATE (PF) 100 MCG/2ML IJ SOLN: 25.0000 ug | INTRAMUSCULAR | Status: DC | PRN

## 2016-09-09 MED ORDER — SUGAMMADEX SODIUM 200 MG/2ML IV SOLN
INTRAVENOUS | Status: DC | PRN
  Administered 2016-09-09: 180 mg via INTRAVENOUS

## 2016-09-09 MED ORDER — LACTATED RINGERS IV SOLN
INTRAVENOUS | Status: DC | PRN
  Administered 2016-09-09: 10:00:00 via INTRAVENOUS

## 2016-09-09 MED ORDER — ROCURONIUM BROMIDE 50 MG/5ML IV SOLN
INTRAVENOUS | Status: AC
  Filled 2016-09-09: qty 1

## 2016-09-09 MED ORDER — EPHEDRINE SULFATE 50 MG/ML IJ SOLN
INTRAMUSCULAR | Status: DC | PRN
  Administered 2016-09-09: 5 mg via INTRAVENOUS

## 2016-09-09 MED ORDER — ROCURONIUM BROMIDE 100 MG/10ML IV SOLN
INTRAVENOUS | Status: DC | PRN
  Administered 2016-09-09: 10 mg via INTRAVENOUS
  Administered 2016-09-09: 5 mg via INTRAVENOUS
  Administered 2016-09-09: 45 mg via INTRAVENOUS
  Administered 2016-09-09: 10 mg via INTRAVENOUS

## 2016-09-09 MED ORDER — GLUCAGON HCL RDNA (DIAGNOSTIC) 1 MG IJ SOLR
INTRAMUSCULAR | Status: DC | PRN
  Administered 2016-09-09: 1 mg via INTRAVENOUS

## 2016-09-09 MED ORDER — KETOROLAC TROMETHAMINE 30 MG/ML IJ SOLN
30.0000 mg | Freq: Four times a day (QID) | INTRAMUSCULAR | Status: DC | PRN
  Administered 2016-09-10: 30 mg via INTRAVENOUS
  Filled 2016-09-09: qty 1

## 2016-09-09 MED ORDER — BUPIVACAINE-EPINEPHRINE (PF) 0.5% -1:200000 IJ SOLN
INTRAMUSCULAR | Status: DC | PRN
  Administered 2016-09-09: 30 mL

## 2016-09-09 MED ORDER — DEXAMETHASONE SODIUM PHOSPHATE 10 MG/ML IJ SOLN: INTRAMUSCULAR | Status: DC | PRN

## 2016-09-09 MED ORDER — PROPOFOL 10 MG/ML IV BOLUS
INTRAVENOUS | Status: AC
  Filled 2016-09-09: qty 20

## 2016-09-09 MED ORDER — SUCCINYLCHOLINE CHLORIDE 20 MG/ML IJ SOLN
INTRAMUSCULAR | Status: DC | PRN
  Administered 2016-09-09: 100 mg via INTRAVENOUS

## 2016-09-09 MED ORDER — GLUCAGON HCL RDNA (DIAGNOSTIC) 1 MG IJ SOLR
INTRAMUSCULAR | Status: AC
  Filled 2016-09-09: qty 1

## 2016-09-09 SURGICAL SUPPLY — 55 items
APPLIER CLIP 5 13 M/L LIGAMAX5 (MISCELLANEOUS) ×2
BLADE CLIPPER SURG (BLADE) ×2 IMPLANT
BLADE SURG 15 STRL LF DISP TIS (BLADE) ×1 IMPLANT
BLADE SURG 15 STRL SS (BLADE) ×1
BULB RESERV EVAC DRAIN JP 100C (MISCELLANEOUS) IMPLANT
CANISTER SUCT 1200ML W/VALVE (MISCELLANEOUS) ×2 IMPLANT
CATH CHOLANGI 4FR 420404F (CATHETERS) IMPLANT
CHLORAPREP W/TINT 26ML (MISCELLANEOUS) ×2 IMPLANT
CLIP APPLIE 5 13 M/L LIGAMAX5 (MISCELLANEOUS) ×1 IMPLANT
CONRAY 60ML FOR OR (MISCELLANEOUS) ×2 IMPLANT
DERMABOND ADVANCED (GAUZE/BANDAGES/DRESSINGS) ×1
DERMABOND ADVANCED .7 DNX12 (GAUZE/BANDAGES/DRESSINGS) ×1 IMPLANT
DRAIN CHANNEL JP 19F (MISCELLANEOUS) IMPLANT
DRAPE C-ARM XRAY 36X54 (DRAPES) ×2 IMPLANT
DRAPE PED LAPAROTOMY (DRAPES) ×2 IMPLANT
ELECT REM PT RETURN 9FT ADLT (ELECTROSURGICAL) ×2
ELECTRODE REM PT RTRN 9FT ADLT (ELECTROSURGICAL) ×1 IMPLANT
FILTER LAP SMOKE EVAC STRL (MISCELLANEOUS) IMPLANT
GLOVE SURG SYN 7.0 (GLOVE) ×6 IMPLANT
GLOVE SURG SYN 7.5  E (GLOVE) ×1
GLOVE SURG SYN 7.5 E (GLOVE) ×1 IMPLANT
GOWN STRL REUS W/ TWL LRG LVL3 (GOWN DISPOSABLE) ×2 IMPLANT
GOWN STRL REUS W/TWL LRG LVL3 (GOWN DISPOSABLE) ×2
IRRIGATION STRYKERFLOW (MISCELLANEOUS) ×1 IMPLANT
IRRIGATOR STRYKERFLOW (MISCELLANEOUS) ×2
IV CATH ANGIO 12GX3 LT BLUE (NEEDLE) ×2 IMPLANT
IV NS 1000ML (IV SOLUTION) ×1
IV NS 1000ML BAXH (IV SOLUTION) ×1 IMPLANT
JACKSON PRATT 10 (INSTRUMENTS) IMPLANT
L-HOOK LAP DISP 36CM (ELECTROSURGICAL) ×2
LABEL OR SOLS (LABEL) ×2 IMPLANT
LHOOK LAP DISP 36CM (ELECTROSURGICAL) ×1 IMPLANT
NDL SAFETY 22GX1.5 (NEEDLE) ×2 IMPLANT
NEEDLE HYPO 22GX1.5 SAFETY (NEEDLE) ×2 IMPLANT
NS IRRIG 500ML POUR BTL (IV SOLUTION) ×2 IMPLANT
PACK BASIN MINOR ARMC (MISCELLANEOUS) ×2 IMPLANT
PACK LAP CHOLECYSTECTOMY (MISCELLANEOUS) ×2 IMPLANT
PENCIL ELECTRO HAND CTR (MISCELLANEOUS) ×2 IMPLANT
POUCH SPECIMEN RETRIEVAL 10MM (ENDOMECHANICALS) ×2 IMPLANT
SCISSORS METZENBAUM CVD 33 (INSTRUMENTS) ×2 IMPLANT
SLEEVE ADV FIXATION 5X100MM (TROCAR) ×6 IMPLANT
SPONGE VERSALON 4X4 4PLY (MISCELLANEOUS) IMPLANT
SUT ETHIBOND 0 MO6 C/R (SUTURE) ×2 IMPLANT
SUT MNCRL 4-0 (SUTURE) ×1
SUT MNCRL 4-0 27XMFL (SUTURE) ×1
SUT MNCRL AB 4-0 PS2 18 (SUTURE) ×2 IMPLANT
SUT VIC AB 3-0 SH 27 (SUTURE) ×1
SUT VIC AB 3-0 SH 27X BRD (SUTURE) ×1 IMPLANT
SUT VICRYL 0 AB UR-6 (SUTURE) ×2 IMPLANT
SUTURE MNCRL 4-0 27XMF (SUTURE) ×1 IMPLANT
SYR 20CC LL (SYRINGE) ×2 IMPLANT
TROCAR 130MM GELPORT  DAV (MISCELLANEOUS) ×2 IMPLANT
TROCAR BALLN GELPORT 12X130M (ENDOMECHANICALS) ×2 IMPLANT
TROCAR Z-THREAD OPTICAL 5X100M (TROCAR) ×2 IMPLANT
TUBING INSUFFLATOR HI FLOW (MISCELLANEOUS) ×2 IMPLANT

## 2016-09-09 NOTE — Progress Notes (Signed)
[facility]   PATIENT NAME: Jared Baldwin    MR#:  [id number]  DATE OF BIRTH:  1945-02-13  SUBJECTIVE:  CHIEF COMPLAINT:   Chief Complaint  Patient presents with  . Abnormal Lab   S/P Laparoscopic cholecystectomy with intraoperative cholangiogram; umbilical hernia repair just now. He complains of abdominal pain 9 out of 10.  REVIEW OF SYSTEMS:  CONSTITUTIONAL: No fever, fatigue or weakness.  EYES: No blurred or double vision.  EARS, NOSE, AND THROAT: No tinnitus or ear pain.  RESPIRATORY: No cough, shortness of breath, wheezing or hemoptysis.  CARDIOVASCULAR: No chest pain, orthopnea, edema.  GASTROINTESTINAL: No nausea, vomiting, diarrhea , have abdominal pain.  GENITOURINARY: No dysuria, hematuria.  ENDOCRINE: No polyuria, nocturia,  HEMATOLOGY: No anemia, easy bruising or bleeding SKIN: No rash or lesion. MUSCULOSKELETAL: No joint pain or arthritis.   NEUROLOGIC: No tingling, numbness, weakness.  PSYCHIATRY: No anxiety or depression.   ROS  DRUG ALLERGIES:   Allergies  Allergen Reactions  . Statins Other (See Comments)    Muscle pain with atorvastatin and simvastatin    VITALS:  Blood pressure (!) 157/70, pulse 73, temperature 98 F (36.7 C), resp. rate 14, height 5\' 7"  (1.702 m), weight 197 lb 6.4 oz (89.5 kg), SpO2 97 %.  PHYSICAL EXAMINATION:  GENERAL:  71 y.o.-year-old patient lying in the bed with no acute distress.  EYES: Pupils equal, round, reactive to light and accommodation. No scleral icterus. Extraocular muscles intact.  HEENT: Head atraumatic, normocephalic. Oropharynx and nasopharynx clear.  NECK:  Supple, no jugular venous distention. No thyroid enlargement, no tenderness.  LUNGS: Normal breath sounds bilaterally, no wheezing, rales,rhonchi or crepitation. No use of accessory muscles of respiration.  CARDIOVASCULAR: S1, S2 normal. No murmurs, rubs, or gallops.  ABDOMEN: Soft, tenderness, in dressing,  nondistended. Bowel sounds not present. Unable to exam organomegaly or mass due to abdominal pain.  EXTREMITIES: No pedal edema, cyanosis, or clubbing.  NEUROLOGIC: Cranial nerves II through XII are intact. Muscle strength 4/5 in all extremities. Sensation intact. Gait not checked.  PSYCHIATRIC: The patient is alert and oriented x 3.  SKIN: No obvious rash, lesion, or ulcer.   Physical Exam LABORATORY PANEL:   CBC  Recent Labs Lab 09/07/16 0346  WBC 5.0  HGB 12.0*  HCT 34.9*  PLT 198   ------------------------------------------------------------------------------------------------------------------  Chemistries   Recent Labs Lab 09/07/16 0346  09/09/16 0342  NA 141  < > 140  K 3.4*  < > 4.0  CL 107  < > 109  CO2 27  < > 26  GLUCOSE 88  < > 83  BUN 13  < > 6  CREATININE 1.06  < > 0.94  CALCIUM 9.1  < > 8.8*  MG 2.0  --   --   AST 71*  < > 28  ALT 176*  < > 95*  ALKPHOS 396*  < > 306*  BILITOT 1.0  < > 0.7  < > = values in this interval not displayed. ------------------------------------------------------------------------------------------------------------------  Cardiac Enzymes No results for input(s): TROPONINI in the last 168 hours. ------------------------------------------------------------------------------------------------------------------  RADIOLOGY:  Dg Cholangiogram Operative  Result Date: 09/09/2016 CLINICAL DATA:  Intraoperative cholangiogram during laparoscopic cholecystectomy. EXAM: INTRAOPERATIVE CHOLANGIOGRAM FLUOROSCOPY TIME:  1 minutes, 12 seconds COMPARISON:  MRCP - 09/07/2016 FINDINGS: Intraoperative cholangiographic images of the right upper abdominal quadrant during laparoscopic cholecystectomy are provided for review. Surgical clips overlie the expected location of the gallbladder fossa. Contrast injection demonstrates selective cannulation of  the central aspect of the cystic duct. There is passage of contrast through the central aspect of the  cystic duct with filling of a moderately dilated common bile duct. There is passage of contrast though the CBD and into the descending portion of the duodenum. There is an apparent mobile nonocclusive filling defect within the distal aspect of the common bile duct, not definitely seen on subsequent cholangiographic images. IMPRESSION: Potential nonocclusive choledocholithiasis. Correlation with operative report is recommended. Further evaluation with ERCP could be performed as indicated. Electronically Signed   By: Simonne Come M.D.   On: 09/09/2016 12:04    ASSESSMENT AND PLAN:   Principal Problem:   Gallstone pancreatitis  * Acute on chronic pancreatitis Lipase improved.  * Elevated liver enzymes with gallbladder sludge on ultrasound No cholecystitis. MRCP does not show stone in the duct. S/p Laparoscopic cholecystectomy with intraoperative cholangiogram; umbilical hernia repair today.  * hypertension. 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 Lost 60 pounds.  * Hypokalemia. Improved with supplement.  All the records are reviewed and case discussed with Care Management/Social Workerr. Management plans discussed with the patient, family and they are in agreement.  CODE STATUS: full.  TOTAL TIME TAKING CARE OF THIS PATIENT: 35 minutes.   POSSIBLE D/C IN 1 DAYS, DEPENDING ON CLINICAL CONDITION.   Shaune Pollack M.D on 09/09/2016   Between 7am to 6pm - Pager - [phone]  After 6pm go to [email or web address] - password [email or web address]  [facility]  Office  [phone]  CC: Primary care physician; Lynnea Ferrier, MD  Note: This dictation was prepared with Dragon dictation along with smaller phrase technology. Any transcriptional errors that result from this process are unintentional.

## 2016-09-09 NOTE — Anesthesia Post-op Follow-up Note (Signed)
Anesthesia QCDR form completed.        

## 2016-09-09 NOTE — Anesthesia Procedure Notes (Signed)
Procedure Name: Intubation Performed by: Lance Muss Pre-anesthesia Checklist: Patient identified, Patient being monitored, Timeout performed, Emergency Drugs available and Suction available Patient Re-evaluated:Patient Re-evaluated prior to induction Oxygen Delivery Method: Circle system utilized Preoxygenation: Pre-oxygenation with 100% oxygen Induction Type: IV induction Ventilation: Mask ventilation without difficulty and Oral airway inserted - appropriate to patient size Laryngoscope Size: Mac and 3 Grade View: Grade I Tube type: Oral Tube size: 7.5 mm Number of attempts: 1 Airway Equipment and Method: Stylet Placement Confirmation: ETT inserted through vocal cords under direct vision,  positive ETCO2 and breath sounds checked- equal and bilateral Secured at: 22 cm Tube secured with: Tape Dental Injury: Teeth and Oropharynx as per pre-operative assessment

## 2016-09-09 NOTE — Progress Notes (Signed)
09/09/2016  Subjective: Patient is Day of Surgery s/p laparoscopic cholecystectomy with intraoperative cholangiogram and umbilical hernia repair.  Cholangiogram showed a mobile non-obstructing CBD stone which was successfully flushed down into the intestine with saline and using glucagon.  Currently, pain is well controlled and minimal.  No nausea and tolerated clears.  Vital signs: Temp:  [97.5 F (36.4 C)-98.3 F (36.8 C)] 97.8 F (36.6 C) (09/08 1426) Pulse Rate:  [60-76] 72 (09/08 1600) Resp:  [14-20] 20 (09/08 1426) BP: (131-160)/(58-84) 150/84 (09/08 1600) SpO2:  [94 %-100 %] 97 % (09/08 1519) Weight:  [89.5 kg (197 lb 6.4 oz)] 89.5 kg (197 lb 6.4 oz) (09/08 0509)   Intake/Output: 09/07 0701 - 09/08 0700 In: 3633.3 [P.O.:1360; I.V.:2273.3] Out: 975 [Urine:975] Last BM Date: 09/08/16  Physical Exam: Constitutional: No acute distress Abdomen:  Soft, nondistended, appropriately tender to palpation.  Incisions are clean, dry, intact.  Labs:   Recent Labs  09/07/16 0346  WBC 5.0  HGB 12.0*  HCT 34.9*  PLT 198    Recent Labs  09/08/16 0400 09/09/16 0342  NA 140 140  K 3.8 4.0  CL 108 109  CO2 26 26  GLUCOSE 87 83  BUN 8 6  CREATININE 1.03 0.94  CALCIUM 9.1 8.8*    Recent Labs  09/07/16 0346  LABPROT 14.1  INR 1.10    Imaging: Dg Cholangiogram Operative  Result Date: 09/09/2016 CLINICAL DATA:  Intraoperative cholangiogram during laparoscopic cholecystectomy. EXAM: INTRAOPERATIVE CHOLANGIOGRAM FLUOROSCOPY TIME:  1 minutes, 12 seconds COMPARISON:  MRCP - 09/07/2016 FINDINGS: Intraoperative cholangiographic images of the right upper abdominal quadrant during laparoscopic cholecystectomy are provided for review. Surgical clips overlie the expected location of the gallbladder fossa. Contrast injection demonstrates selective cannulation of the central aspect of the cystic duct. There is passage of contrast through the central aspect of the cystic duct with filling  of a moderately dilated common bile duct. There is passage of contrast though the CBD and into the descending portion of the duodenum. There is an apparent mobile nonocclusive filling defect within the distal aspect of the common bile duct, not definitely seen on subsequent cholangiographic images. IMPRESSION: Potential nonocclusive choledocholithiasis. Correlation with operative report is recommended. Further evaluation with ERCP could be performed as indicated. Electronically Signed   By: Simonne ComeJohn  Watts M.D.   On: 09/09/2016 12:04    Assessment/Plan: 71 yo male s/p lap cholecystectomy with IOC and umbilical hernia repair.  --will advance to full liquid diet and decrease IV fluid rate --add toradol for pain control.  Patient has side effects with narcotics. --CMP tomorrow morning to assess LFTs --likely will be able to discharge home tomorrow.   Howie IllJose Luis Dejohn Ibarra, MD [facility]

## 2016-09-09 NOTE — Progress Notes (Signed)
09/09/2016  Subjective: No acute events overnight.  Patient reports mild discomfort in the right upper quadrant but no epigastric pain.  Tolerated clears well yesterday and is now NPO for surgery today.  Vital signs: Temp:  [98 F (36.7 C)-98.6 F (37 C)] 98.3 F (36.8 C) (09/08 0756) Pulse Rate:  [60-64] 62 (09/08 0756) Resp:  [18-20] 18 (09/08 0756) BP: (138-147)/(66-76) 142/76 (09/08 0756) SpO2:  [98 %-100 %] 98 % (09/08 0756) Weight:  [89.5 kg (197 lb 6.4 oz)] 89.5 kg (197 lb 6.4 oz) (09/08 0509)   Intake/Output: 09/07 0701 - 09/08 0700 In: 3633.3 [P.O.:1360; I.V.:2273.3] Out: 975 [Urine:975] Last BM Date: 09/08/16  Physical Exam: Constitutional: No acute distress Abdomen:  Soft, nondistended, with mild discomfort to palpation in the right upper quadrant.  Negative Murphy's sign.  Has very small umbilical hernia.  Labs:   Recent Labs  09/06/16 1445 09/07/16 0346  WBC 5.2 5.0  HGB 12.5* 12.0*  HCT 36.7* 34.9*  PLT 231 198    Recent Labs  09/08/16 0400 09/09/16 0342  NA 140 140  K 3.8 4.0  CL 108 109  CO2 26 26  GLUCOSE 87 83  BUN 8 6  CREATININE 1.03 0.94  CALCIUM 9.1 8.8*    Recent Labs  09/07/16 0346  LABPROT 14.1  INR 1.10    Imaging: No results found.  Assessment/Plan: 71 yo male with gallstone pancreatitis  --discussed again that plan for today will be to do a laparoscopic cholecystectomy, intraoperative cholangiogram to evaluate his biliary tree, and umbilical hernia repair.  He is willing to proceed and will go to OR this morning.   Howie IllJose Luis Diavian Furgason, MD [facility]

## 2016-09-09 NOTE — Op Note (Signed)
Procedure Date:  09/09/2016  Pre-operative Diagnosis:   Gallstone pancreatitis, umbilical hernia  Post-operative Diagnosis:  Gallstone pancreatitis, umbilical hernia  Procedure:  Laparoscopic cholecystectomy with intraoperative cholangiogram; umbilical hernia repair  Surgeon:  Howie IllJose Luis Karmyn Lowman, MD  Anesthesia:  General endotracheal  Estimated Blood Loss:  10 ml  Specimens:  gallbladder  Complications:  None  Indications for Procedure:  This is a 71 y.o. male who presents with abdominal pain and workup revealing gallstone pancreatitis.  His MRCP was negative and his lipase normalized.  He also has a small umbilical hernia.  The benefits, complications, treatment options, and expected outcomes were discussed with the patient. The risks of bleeding, infection, recurrence of symptoms, failure to resolve symptoms, bile duct damage, bile duct leak, retained common bile duct stone, bowel injury, and need for further procedures were all discussed with the patient and he was willing to proceed.  Description of Procedure: The patient was correctly identified in the preoperative area and brought into the operating room.  The patient was placed supine with VTE prophylaxis in place.  Appropriate time-outs were performed.  Anesthesia was induced and the patient was intubated.  Appropriate antibiotics were infused.  The abdomen was prepped and draped in a sterile fashion. An infraumbilical incision was made. Upon dissection around the umbilicus, no hernia was palpable or identifiable.  A cutdown technique was used to enter the abdominal cavity without injury, and a Hasson trocar was inserted.  Pneumoperitoneum was obtained with appropriate opening pressures.  A 5-mm port was placed in the subxiphoid area and two 5-mm ports were placed in the right upper quadrant under direct visualization.  The gallbladder was identified.  The fundus was grasped and retracted cephalad.  In doing so, there was bile  spillage.  Adhesions were lysed bluntly and with electrocautery. The infundibulum was grasped and retracted laterally, exposing the peritoneum overlying the gallbladder.  This was incised with electrocautery and extended on either side of the gallbladder.  The cystic duct and cystic artery were clearly identified and bluntly dissected.  The cystic duct was clipped once distally and a ductotomy was created.  A 14Fr angiocath was placed in the right upper quadrant and the cholangiogram catheter passed through it.  Using Maryland forceps, the catheter was introduced into the ductotomy and secured with a clip.  The C-arm was then brought into the field and a cholangiogram was performed showing a mobile stone in the distal common bile duct.  This was non-obstructing, as contrast was going into the intestine.  Flushing with saline was able to push the stone distally to the ampulla.  1 mg of glucagon was given and then duct was flushed again with saline.  Another film with contrast revealed no further filling defect and prompt filling of contrast into the intestine.    After completion of the cholangiogram, the catheter was removed and the cystic duct was clipped twice proximally and cut in between.  The cystic artery was clipped and cut in same fashion.  The gallbladder was taken from the gallbladder fossa in a retrograde fashion with electrocautery. The gallbladder was placed in an Endocatch bag and brought out via the umbilical incision. The liver bed was inspected and any bleeding was controlled with electrocautery. The right upper quadrant was then inspected again revealing intact clips, no bleeding, and no ductal injury.  The area was thoroughly irrigated and the bile spillage was thoroughly cleaned.  The camera was then placed in the subxyphoid port and the umbilicus was  visualized.  Cautery was used to free some omental adhesions and then a very small, 5 mm hernia defect just superior to our Hasson port was  visualized.    The 5 mm ports were removed under direct visualization and the Hasson trocar was removed.  An additional 0-Vicryl suture was used to close the hernia defect. The fascial opening was closed using 0 vicryl suture as well.  Local anesthetic was infused in all incisions and the incisions were closed with 4-0 Monocryl.  The wounds were cleaned and sealed with DermaBond.  The patient was emerged from anesthesia and extubated and brought to the recovery room for further management.  The patient tolerated the procedure well and all counts were correct at the end of the case.   Howie Ill, MD

## 2016-09-09 NOTE — Anesthesia Postprocedure Evaluation (Signed)
Anesthesia Post Note  Patient: Jared Baldwin  Procedure(s) Performed: Procedure(s) (LRB): LAPAROSCOPIC CHOLECYSTECTOMY WITH INTRAOPERATIVE CHOLANGIOGRAM (N/A) HERNIA REPAIR UMBILICAL ADULT (N/A)  Patient location during evaluation: PACU Anesthesia Type: General Level of consciousness: awake and alert Pain management: pain level controlled Vital Signs Assessment: post-procedure vital signs reviewed and stable Respiratory status: spontaneous breathing, nonlabored ventilation, respiratory function stable and patient connected to nasal cannula oxygen Cardiovascular status: blood pressure returned to baseline and stable Postop Assessment: no signs of nausea or vomiting Anesthetic complications: no     Last Vitals:  Vitals:   09/09/16 1323 09/09/16 1338  BP: (!) 151/75 (!) 157/70  Pulse: 73 73  Resp: 19 14  Temp:  36.7 C  SpO2: 94% 97%    Last Pain:  Vitals:   09/09/16 1338  TempSrc:   PainSc: 9                  Joseph K Piscitello

## 2016-09-09 NOTE — Transfer of Care (Signed)
Immediate Anesthesia Transfer of Care Note  Patient: Jared Baldwin  Procedure(s) Performed: Procedure(s): LAPAROSCOPIC CHOLECYSTECTOMY WITH INTRAOPERATIVE CHOLANGIOGRAM (N/A) HERNIA REPAIR UMBILICAL ADULT (N/A)  Patient Location: PACU  Anesthesia Type:General  Level of Consciousness: sedated  Airway & Oxygen Therapy: Patient Spontanous Breathing and Patient connected to face mask oxygen  Post-op Assessment: Report given to RN and Post -op Vital signs reviewed and stable  Post vital signs: Reviewed and stable  Last Vitals:  Vitals:   09/09/16 0756 09/09/16 1238  BP: (!) 142/76 (!) (P) 160/70  Pulse: 62   Resp: 18   Temp: 36.8 C (!) (P) 36.4 C  SpO2: 98%     Last Pain:  Vitals:   09/09/16 0756  TempSrc: Oral  PainSc: 0-No pain      Patients Stated Pain Goal: 4 (09/07/16 0920)  Complications: No apparent anesthesia complications

## 2016-09-09 NOTE — Anesthesia Preprocedure Evaluation (Signed)
Anesthesia Evaluation  Patient identified by MRN, date of birth, ID band Patient awake    Reviewed: Allergy & Precautions, H&P , NPO status , Patient's Chart, lab work & pertinent test results  History of Anesthesia Complications Negative for: history of anesthetic complications  Airway Mallampati: III  TM Distance: <3 FB Neck ROM: limited    Dental  (+) Poor Dentition, Missing, Edentulous Lower, Edentulous Upper   Pulmonary neg shortness of breath, former smoker,           Cardiovascular Exercise Tolerance: Good hypertension, (-) angina(-) Past MI and (-) DOE      Neuro/Psych negative neurological ROS  negative psych ROS   GI/Hepatic Neg liver ROS, GERD  Medicated and Controlled,  Endo/Other  Hypothyroidism   Renal/GU      Musculoskeletal  (+) Arthritis ,   Abdominal   Peds  Hematology negative hematology ROS (+)   Anesthesia Other Findings Past Medical History: No date: Arthritis No date: GERD (gastroesophageal reflux disease) No date: Hyperlipidemia No date: Hypertension No date: Hypothyroidism  Past Surgical History: No date: BACK SURGERY No date: CATARACT EXTRACTION W/ INTRAOCULAR LENS  IMPLANT, BILATERAL No date: HERNIA REPAIR 07/24/2016: KNEE ARTHROPLASTY; Right     Comment:  Procedure: COMPUTER ASSISTED TOTAL KNEE ARTHROPLASTY;                Surgeon: Donato HeinzHooten, James P, MD;  Location: [facility] ORS;                Service: Orthopedics;  Laterality: Right; No date: TOTAL KNEE ARTHROPLASTY  BMI    Body Mass Index:  30.92 kg/m      Reproductive/Obstetrics negative OB ROS                             Anesthesia Physical Anesthesia Plan  ASA: III  Anesthesia Plan: General ETT   Post-op Pain Management:    Induction: Intravenous  PONV Risk Score and Plan: 3 and Ondansetron, Dexamethasone and Treatment may vary due to age or medical condition  Airway Management Planned:  Oral ETT  Additional Equipment:   Intra-op Plan:   Post-operative Plan: Extubation in OR  Informed Consent: I have reviewed the patients History and Physical, chart, labs and discussed the procedure including the risks, benefits and alternatives for the proposed anesthesia with the patient or authorized representative who has indicated his/her understanding and acceptance.   Dental Advisory Given  Plan Discussed with: Anesthesiologist, CRNA and Surgeon  Anesthesia Plan Comments: (Patient consented for risks of anesthesia including but not limited to:  - adverse reactions to medications - damage to teeth, lips or other oral mucosa - sore throat or hoarseness - Damage to heart, brain, lungs or loss of life  Patient voiced understanding.)        Anesthesia Quick Evaluation

## 2016-09-10 ENCOUNTER — Encounter: Payer: Self-pay | Admitting: Surgery

## 2016-09-10 LAB — COMPREHENSIVE METABOLIC PANEL
ALBUMIN: 3.1 g/dL — AB (ref 3.5–5.0)
ALK PHOS: 265 U/L — AB (ref 38–126)
ALT: 98 U/L — ABNORMAL HIGH (ref 17–63)
AST: 67 U/L — AB (ref 15–41)
Anion gap: 6 (ref 5–15)
BILIRUBIN TOTAL: 0.9 mg/dL (ref 0.3–1.2)
BUN: 9 mg/dL (ref 6–20)
CALCIUM: 8.7 mg/dL — AB (ref 8.9–10.3)
CO2: 25 mmol/L (ref 22–32)
Chloride: 107 mmol/L (ref 101–111)
Creatinine, Ser: 1.16 mg/dL (ref 0.61–1.24)
GFR calc Af Amer: >60 mL/min (ref >60)
GFR calc non Af Amer: >60 mL/min (ref >60)
GLUCOSE: 96 mg/dL (ref 65–99)
Potassium: 4.1 mmol/L (ref 3.5–5.1)
Sodium: 138 mmol/L (ref 135–145)
TOTAL PROTEIN: 6 g/dL — AB (ref 6.5–8.1)

## 2016-09-10 NOTE — Progress Notes (Signed)
Pt alert and oriented. Resting in bed. Complaining of "gassy feeling". Dr. Aleen CampiPiscoya notified. Will continue to monitor

## 2016-09-10 NOTE — Progress Notes (Signed)
09/10/2016  Subjective: Patient is 1 Day Post-Op s/p laparoscopic cholecystectomy with intraoperative cholangiogram and umbilical hernia repair.  No acute events overnight.  Patient reports feeling gassy, but otherwise no nausea or vomiting.  Pain well controlled.  Vital signs: Temp:  [97.5 F (36.4 C)-98.6 F (37 C)] 98.6 F (37 C) (09/09 0757) Pulse Rate:  [69-96] 71 (09/09 0757) Resp:  [14-20] 18 (09/09 0757) BP: (118-160)/(57-84) 124/61 (09/09 0757) SpO2:  [94 %-100 %] 98 % (09/09 0757)   Intake/Output: 09/08 0701 - 09/09 0700 In: 1510 [P.O.:760; I.V.:750] Out: 710 [Urine:700; Blood:10] Last BM Date: 09/08/16  Physical Exam: Constitutional:  No acute distress Abdomen:  Soft, nondistended, appropriately tender to palpation.  Incisions clean, dry, intact.  Labs:  No results for input(s): WBC, HGB, HCT, PLT in the last 72 hours.  Recent Labs  09/09/16 0342 09/10/16 0315  NA 140 138  K 4.0 4.1  CL 109 107  CO2 26 25  GLUCOSE 83 96  BUN 6 9  CREATININE 0.94 1.16  CALCIUM 8.8* 8.7*   No results for input(s): LABPROT, INR in the last 72 hours.    Assessment/Plan: 71 yo male s/p laparoscopic cholecystectomy with IOC and umbilical hernia repair.  --advance diet to regular this morning --LFTs ok, with expected rise in AST/ALT from surgery, but alk phos decreasing and total bilirubin normal. --may be discharged today from surgical standpoint.  Pt has appointment with me on 9/26 at 1:30 pm.  No heavy lifting or pushing of more than 10-15 lbs for 4 weeks.     Melvyn Neth, [place]

## 2016-09-10 NOTE — Progress Notes (Signed)
Pt discharge instructions given, IV removed. Waiting for wife to arrive to be D/Ced home.

## 2016-09-10 NOTE — Discharge Summary (Signed)
[facility] [place] at [facility]   PATIENT NAME: Jared Baldwin    MR#:  [id number]  DATE OF BIRTH:  June 21, 1945  DATE OF ADMISSION:  09/06/2016   ADMITTING PHYSICIAN: Milagros Loll, MD  DATE OF DISCHARGE: 09/10/2016 10:35 AM  PRIMARY CARE PHYSICIAN: Lynnea Ferrier, MD   ADMISSION DIAGNOSIS:  Biliary colic [K80.50] Nausea [R11.0] RUQ pain [R10.11] Elevated LFTs [R79.89] Acute on chronic pancreatitis (HCC) [K85.90, K86.1] DISCHARGE DIAGNOSIS:  Principal Problem:   Gallstone pancreatitis  SECONDARY DIAGNOSIS:   Past Medical History:  Diagnosis Date  . Arthritis   . GERD (gastroesophageal reflux disease)   . Hyperlipidemia   . Hypertension   . Hypothyroidism    HOSPITAL COURSE:   * Acute on chronic pancreatitis Improved.  * Elevated liver enzymes with gallbladder sludge on ultrasound No cholecystitis. MRCP does not show stone in the duct. S/p Laparoscopic cholecystectomy with intraoperative cholangiogram; umbilical hernia repair yesterday. Follow-up surgeon as outpatient.  * hypertension. Controlled. 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 Lost 60 pounds.  * Hypokalemia. Improved with supplement.  DISCHARGE CONDITIONS:  Stable, discharged to home today. CONSULTS OBTAINED:  Treatment Team:  Henrene Dodge, MD DRUG ALLERGIES:   Allergies  Allergen Reactions  . Statins Other (See Comments)    Muscle pain with atorvastatin and simvastatin   DISCHARGE MEDICATIONS:   Allergies as of 09/10/2016      Reactions   Statins Other (See Comments)   Muscle pain with atorvastatin and simvastatin      Medication List    TAKE these medications   Cyanocobalamin 5000 MCG/ML Liqd Take 5,000 mcg by mouth daily.   diphenhydrAMINE 25 MG tablet Commonly known as:  SOMINEX Take 25 mg by mouth at bedtime as needed for sleep.   levothyroxine 88 MCG tablet Commonly known as:  SYNTHROID, LEVOTHROID Take 88 mcg  by mouth daily before breakfast.   pravastatin 80 MG tablet Commonly known as:  PRAVACHOL Take 80 mg by mouth daily.            Discharge Care Instructions        Start     Ordered   09/10/16 0000  Diet - low sodium heart healthy     09/10/16 0915   09/10/16 0000  Increase activity slowly     09/10/16 0915   09/10/16 0000  Driving Restrictions    Comments:  Do not drive while taking narcotics for pain control.   09/10/16 0915   09/10/16 0000  Lifting restrictions    Comments:  No heavy lifting or pushing of more than 10-15 lbs for 4 weeks.   09/10/16 0915   09/10/16 0000  Call MD for:  persistant nausea and vomiting     09/10/16 0915   09/10/16 0000  Call MD for:  severe uncontrolled pain     09/10/16 0915   09/10/16 0000  Call MD for:  redness, tenderness, or signs of infection (pain, swelling, redness, odor or green/yellow discharge around incision site)     09/10/16 0915   09/10/16 0000  Call MD for:  difficulty breathing, headache or visual disturbances     09/10/16 0915   09/10/16 0000  Discharge instructions    Comments:  1.  Patient may shower, but do not scrub wound heavily and dab dry only. 2.  Do not submerge wounds in pool/tub 3.  Do not apply ointments or hydrogen peroxide to the wounds.   09/10/16  0915   09/10/16 0000  No dressing needed     09/10/16 0915   09/10/16 0000  Call MD for:  temperature >100.4     09/10/16 0915   09/10/16 0000  Increase activity slowly     09/10/16 0947   09/10/16 0000  Diet - low sodium heart healthy     09/10/16 0947       DISCHARGE INSTRUCTIONS:  See AVS.  If you experience worsening of your admission symptoms, develop shortness of breath, life threatening emergency, suicidal or homicidal thoughts you must seek medical attention immediately by calling 911 or calling your MD immediately  if symptoms less severe.  You Must read complete instructions/literature along with all the possible adverse reactions/side effects  for all the Medicines you take and that have been prescribed to you. Take any new Medicines after you have completely understood and accpet all the possible adverse reactions/side effects.   Please note  You were cared for by a hospitalist during your hospital stay. If you have any questions about your discharge medications or the care you received while you were in the hospital after you are discharged, you can call the unit and asked to speak with the hospitalist on call if the hospitalist that took care of you is not available. Once you are discharged, your primary care physician will handle any further medical issues. Please note that NO REFILLS for any discharge medications will be authorized once you are discharged, as it is imperative that you return to your primary care physician (or establish a relationship with a primary care physician if you do not have one) for your aftercare needs so that they can reassess your need for medications and monitor your lab values.    On the day of Discharge:  VITAL SIGNS:  Blood pressure 124/61, pulse 71, temperature 98.6 F (37 C), temperature source Oral, resp. rate 18, height 5\' 7"  (1.702 m), weight 197 lb 6.4 oz (89.5 kg), SpO2 98 %. PHYSICAL EXAMINATION:  GENERAL:  71 y.o.-year-old patient lying in the bed with no acute distress.  EYES: Pupils equal, round, reactive to light and accommodation. No scleral icterus. Extraocular muscles intact.  HEENT: Head atraumatic, normocephalic. Oropharynx and nasopharynx clear.  NECK:  Supple, no jugular venous distention. No thyroid enlargement, no tenderness.  LUNGS: Normal breath sounds bilaterally, no wheezing, rales,rhonchi or crepitation. No use of accessory muscles of respiration.  CARDIOVASCULAR: S1, S2 normal. No murmurs, rubs, or gallops.  ABDOMEN: Soft, non-tender, non-distended. Bowel sounds present. No organomegaly or mass.  EXTREMITIES: No pedal edema, cyanosis, or clubbing.  NEUROLOGIC: Cranial  nerves II through XII are intact. Muscle strength 5/5 in all extremities. Sensation intact. Gait not checked.  PSYCHIATRIC: The patient is alert and oriented x 3.  SKIN: No obvious rash, lesion, or ulcer.  DATA REVIEW:   CBC  Recent Labs Lab 09/07/16 0346  WBC 5.0  HGB 12.0*  HCT 34.9*  PLT 198    Chemistries   Recent Labs Lab 09/07/16 0346  09/10/16 0315  NA 141  < > 138  K 3.4*  < > 4.1  CL 107  < > 107  CO2 27  < > 25  GLUCOSE 88  < > 96  BUN 13  < > 9  CREATININE 1.06  < > 1.16  CALCIUM 9.1  < > 8.7*  MG 2.0  --   --   AST 71*  < > 67*  ALT 176*  < > 98*  ALKPHOS 396*  < > 265*  BILITOT 1.0  < > 0.9  < > = values in this interval not displayed.   Microbiology Results  Results for orders placed or performed during the hospital encounter of 07/11/16  Surgical pcr screen     Status: None   Collection Time: 07/11/16  8:55 AM  Result Value Ref Range Status   MRSA, PCR NEGATIVE NEGATIVE Final   Staphylococcus aureus NEGATIVE NEGATIVE Final    Comment:        The Xpert SA Assay (FDA approved for NASAL specimens in patients over 34 years of age), is one component of a comprehensive surveillance program.  Test performance has been validated by [facility] for patients greater than or equal to 20 year old. It is not intended to diagnose infection nor to guide or monitor treatment.   Urine culture     Status: None   Collection Time: 07/11/16  8:55 AM  Result Value Ref Range Status   Specimen Description URINE, RANDOM  Final   Special Requests Normal  Final   Culture   Final    NO GROWTH Performed at [facility], [address] [place], [place] [address]    Report Status 07/12/2016 FINAL  Final    RADIOLOGY:  No results found.   Management plans discussed with the patient, family and they are in agreement.  CODE STATUS: Prior   TOTAL TIME TAKING CARE OF THIS PATIENT: 32 minutes.    Shaune Pollack M.D on 09/10/2016 at 3:49 PM  Between 7am to  6pm - Pager - [phone]  After 6pm go to [email or web address] - password [email or web address] [facility]  [facility] [place] [facility]  Office  [phone]  CC: Primary care physician; Lynnea Ferrier, MD   Note: This dictation was prepared with Dragon dictation along with smaller phrase technology. Any transcriptional errors that result from this process are unintentional.

## 2016-09-11 ENCOUNTER — Telehealth: Payer: Self-pay

## 2016-09-11 NOTE — Telephone Encounter (Signed)
Left message for patient to return call regarding post op questions.  

## 2016-09-12 LAB — SURGICAL PATHOLOGY

## 2016-09-14 ENCOUNTER — Encounter: Payer: Self-pay | Admitting: Surgery

## 2016-09-25 ENCOUNTER — Other Ambulatory Visit: Payer: Self-pay

## 2016-09-27 ENCOUNTER — Ambulatory Visit (INDEPENDENT_AMBULATORY_CARE_PROVIDER_SITE_OTHER): Payer: PPO | Admitting: Surgery

## 2016-09-27 ENCOUNTER — Encounter: Payer: Self-pay | Admitting: Surgery

## 2016-09-27 VITALS — BP 124/68 | HR 67 | Temp 98.1°F | Ht 67.0 in | Wt 195.6 lb

## 2016-09-27 DIAGNOSIS — Z09 Encounter for follow-up examination after completed treatment for conditions other than malignant neoplasm: Secondary | ICD-10-CM

## 2016-09-27 NOTE — Progress Notes (Signed)
09/27/2016  HPI:  Patient is status post laparoscopic cholecystectomy with intraoperative cholangiogram and umbilical hernia repair on 9/8. He was discharged to home on 9/9. He returns today for postop follow-up. Reports that he's been doing well with no nausea or vomiting, with no worsening pain, and was normal appetite and bowel movements. Denies any issues with the incisions.  Vital signs: BP 124/68   Pulse 67   Temp 98.1 F (36.7 C) (Oral)   Ht  (1.702 m)   Wt 88.7 kg (195 lb 9.6 oz)   BMI 30.64 kg/m    Physical Exam: Constitutional: No acute distress Abdomen:  Soft, nondistended, nontender to palpation. All incisions are clean, dry, and intact with no evidence of infection. No evidence of recurrent hernia.  Assessment/Plan: 71 year old male status post laparoscopic cholecystectomy with intraoperative cholangiogram and umbilical hernia repair.  -Reminded patient of no heavy lifting restriction of more than 10-15 pounds until 10/6. He may resume regular activities after that. -No dietary restrictions. -Patient to follow-up with [place] on an as-needed basis.   Jared Ill, MD [facility]

## 2016-09-27 NOTE — Patient Instructions (Signed)

## 2016-11-09 DIAGNOSIS — I700 Atherosclerosis of aorta: Secondary | ICD-10-CM | POA: Diagnosis not present

## 2016-11-09 DIAGNOSIS — E538 Deficiency of other specified B group vitamins: Secondary | ICD-10-CM | POA: Diagnosis not present

## 2016-11-09 DIAGNOSIS — K219 Gastro-esophageal reflux disease without esophagitis: Secondary | ICD-10-CM | POA: Diagnosis not present

## 2016-11-09 DIAGNOSIS — E785 Hyperlipidemia, unspecified: Secondary | ICD-10-CM | POA: Diagnosis not present

## 2016-11-09 DIAGNOSIS — M545 Low back pain: Secondary | ICD-10-CM | POA: Diagnosis not present

## 2016-11-09 DIAGNOSIS — G8929 Other chronic pain: Secondary | ICD-10-CM | POA: Diagnosis not present

## 2016-11-09 DIAGNOSIS — M542 Cervicalgia: Secondary | ICD-10-CM | POA: Diagnosis not present

## 2016-11-09 DIAGNOSIS — M48061 Spinal stenosis, lumbar region without neurogenic claudication: Secondary | ICD-10-CM | POA: Diagnosis not present

## 2016-11-09 DIAGNOSIS — I10 Essential (primary) hypertension: Secondary | ICD-10-CM | POA: Diagnosis not present

## 2016-11-09 DIAGNOSIS — E039 Hypothyroidism, unspecified: Secondary | ICD-10-CM | POA: Diagnosis not present

## 2016-11-09 DIAGNOSIS — M50322 Other cervical disc degeneration at C5-C6 level: Secondary | ICD-10-CM | POA: Diagnosis not present

## 2017-02-21 DIAGNOSIS — M48061 Spinal stenosis, lumbar region without neurogenic claudication: Secondary | ICD-10-CM | POA: Diagnosis not present

## 2017-02-21 DIAGNOSIS — I10 Essential (primary) hypertension: Secondary | ICD-10-CM | POA: Diagnosis not present

## 2017-02-21 DIAGNOSIS — K219 Gastro-esophageal reflux disease without esophagitis: Secondary | ICD-10-CM | POA: Diagnosis not present

## 2017-02-21 DIAGNOSIS — M19012 Primary osteoarthritis, left shoulder: Secondary | ICD-10-CM | POA: Insufficient documentation

## 2017-02-21 DIAGNOSIS — G8929 Other chronic pain: Secondary | ICD-10-CM | POA: Diagnosis not present

## 2017-02-21 DIAGNOSIS — E538 Deficiency of other specified B group vitamins: Secondary | ICD-10-CM | POA: Diagnosis not present

## 2017-02-21 DIAGNOSIS — K861 Other chronic pancreatitis: Secondary | ICD-10-CM | POA: Diagnosis not present

## 2017-02-21 DIAGNOSIS — E785 Hyperlipidemia, unspecified: Secondary | ICD-10-CM | POA: Diagnosis not present

## 2017-02-21 DIAGNOSIS — I700 Atherosclerosis of aorta: Secondary | ICD-10-CM | POA: Diagnosis not present

## 2017-02-21 DIAGNOSIS — E039 Hypothyroidism, unspecified: Secondary | ICD-10-CM | POA: Diagnosis not present

## 2017-02-21 DIAGNOSIS — M545 Low back pain: Secondary | ICD-10-CM | POA: Diagnosis not present

## 2017-02-21 DIAGNOSIS — Z125 Encounter for screening for malignant neoplasm of prostate: Secondary | ICD-10-CM | POA: Diagnosis not present

## 2017-02-28 DIAGNOSIS — M25512 Pain in left shoulder: Secondary | ICD-10-CM | POA: Diagnosis not present

## 2017-02-28 DIAGNOSIS — G8929 Other chronic pain: Secondary | ICD-10-CM | POA: Diagnosis not present

## 2017-02-28 DIAGNOSIS — M4722 Other spondylosis with radiculopathy, cervical region: Secondary | ICD-10-CM | POA: Diagnosis not present

## 2017-03-20 ENCOUNTER — Other Ambulatory Visit: Payer: Self-pay | Admitting: Sports Medicine

## 2017-03-20 DIAGNOSIS — M4722 Other spondylosis with radiculopathy, cervical region: Secondary | ICD-10-CM

## 2017-03-20 DIAGNOSIS — G8929 Other chronic pain: Secondary | ICD-10-CM

## 2017-03-20 DIAGNOSIS — M25512 Pain in left shoulder: Secondary | ICD-10-CM

## 2017-03-28 ENCOUNTER — Ambulatory Visit
Admission: RE | Admit: 2017-03-28 | Discharge: 2017-03-28 | Disposition: A | Discharge status: Home / Self Care | Payer: PPO | Source: Ambulatory Visit | Attending: Sports Medicine | Admitting: Sports Medicine

## 2017-03-28 DIAGNOSIS — M50223 Other cervical disc displacement at C6-C7 level: Secondary | ICD-10-CM | POA: Diagnosis not present

## 2017-03-28 DIAGNOSIS — M4802 Spinal stenosis, cervical region: Secondary | ICD-10-CM | POA: Insufficient documentation

## 2017-03-28 DIAGNOSIS — G8929 Other chronic pain: Secondary | ICD-10-CM

## 2017-03-28 DIAGNOSIS — M47812 Spondylosis without myelopathy or radiculopathy, cervical region: Secondary | ICD-10-CM | POA: Diagnosis not present

## 2017-03-28 DIAGNOSIS — G9589 Other specified diseases of spinal cord: Secondary | ICD-10-CM | POA: Insufficient documentation

## 2017-03-28 DIAGNOSIS — M25512 Pain in left shoulder: Secondary | ICD-10-CM | POA: Diagnosis not present

## 2017-03-28 DIAGNOSIS — M4722 Other spondylosis with radiculopathy, cervical region: Secondary | ICD-10-CM | POA: Insufficient documentation

## 2017-04-04 DIAGNOSIS — M79602 Pain in left arm: Secondary | ICD-10-CM | POA: Diagnosis not present

## 2017-04-04 DIAGNOSIS — M79601 Pain in right arm: Secondary | ICD-10-CM | POA: Diagnosis not present

## 2017-04-04 DIAGNOSIS — M4722 Other spondylosis with radiculopathy, cervical region: Secondary | ICD-10-CM | POA: Diagnosis not present

## 2017-05-09 DIAGNOSIS — M5030 Other cervical disc degeneration, unspecified cervical region: Secondary | ICD-10-CM | POA: Diagnosis not present

## 2017-05-09 DIAGNOSIS — M5412 Radiculopathy, cervical region: Secondary | ICD-10-CM | POA: Diagnosis not present

## 2017-06-13 DIAGNOSIS — Z9884 Bariatric surgery status: Secondary | ICD-10-CM | POA: Diagnosis not present

## 2017-06-13 DIAGNOSIS — E039 Hypothyroidism, unspecified: Secondary | ICD-10-CM | POA: Diagnosis not present

## 2017-06-13 DIAGNOSIS — E785 Hyperlipidemia, unspecified: Secondary | ICD-10-CM | POA: Diagnosis not present

## 2017-06-13 DIAGNOSIS — E538 Deficiency of other specified B group vitamins: Secondary | ICD-10-CM | POA: Diagnosis not present

## 2017-06-13 DIAGNOSIS — M545 Low back pain: Secondary | ICD-10-CM | POA: Diagnosis not present

## 2017-06-13 DIAGNOSIS — M1711 Unilateral primary osteoarthritis, right knee: Secondary | ICD-10-CM | POA: Diagnosis not present

## 2017-06-13 DIAGNOSIS — M5412 Radiculopathy, cervical region: Secondary | ICD-10-CM | POA: Diagnosis not present

## 2017-06-13 DIAGNOSIS — I10 Essential (primary) hypertension: Secondary | ICD-10-CM | POA: Diagnosis not present

## 2017-06-13 DIAGNOSIS — I700 Atherosclerosis of aorta: Secondary | ICD-10-CM | POA: Diagnosis not present

## 2017-06-13 DIAGNOSIS — K439 Ventral hernia without obstruction or gangrene: Secondary | ICD-10-CM | POA: Diagnosis not present

## 2017-06-13 DIAGNOSIS — K219 Gastro-esophageal reflux disease without esophagitis: Secondary | ICD-10-CM | POA: Diagnosis not present

## 2017-06-13 DIAGNOSIS — M5030 Other cervical disc degeneration, unspecified cervical region: Secondary | ICD-10-CM | POA: Diagnosis not present

## 2017-06-13 DIAGNOSIS — F418 Other specified anxiety disorders: Secondary | ICD-10-CM | POA: Diagnosis not present

## 2017-06-13 DIAGNOSIS — M48061 Spinal stenosis, lumbar region without neurogenic claudication: Secondary | ICD-10-CM | POA: Diagnosis not present

## 2017-07-10 DIAGNOSIS — M6208 Separation of muscle (nontraumatic), other site: Secondary | ICD-10-CM | POA: Diagnosis not present

## 2017-07-10 DIAGNOSIS — K432 Incisional hernia without obstruction or gangrene: Secondary | ICD-10-CM | POA: Diagnosis not present

## 2017-07-11 ENCOUNTER — Other Ambulatory Visit: Payer: Self-pay | Admitting: General Surgery

## 2017-07-11 DIAGNOSIS — K432 Incisional hernia without obstruction or gangrene: Secondary | ICD-10-CM

## 2017-07-18 DIAGNOSIS — M25512 Pain in left shoulder: Secondary | ICD-10-CM | POA: Diagnosis not present

## 2017-07-18 DIAGNOSIS — G8929 Other chronic pain: Secondary | ICD-10-CM | POA: Diagnosis not present

## 2017-07-18 DIAGNOSIS — M4722 Other spondylosis with radiculopathy, cervical region: Secondary | ICD-10-CM | POA: Diagnosis not present

## 2017-07-18 DIAGNOSIS — R202 Paresthesia of skin: Secondary | ICD-10-CM | POA: Diagnosis not present

## 2017-07-18 DIAGNOSIS — R200 Anesthesia of skin: Secondary | ICD-10-CM | POA: Diagnosis not present

## 2017-07-19 ENCOUNTER — Ambulatory Visit: Payer: PPO

## 2017-08-01 DIAGNOSIS — G8929 Other chronic pain: Secondary | ICD-10-CM | POA: Diagnosis not present

## 2017-08-01 DIAGNOSIS — M25512 Pain in left shoulder: Secondary | ICD-10-CM | POA: Diagnosis not present

## 2017-10-25 DIAGNOSIS — R1032 Left lower quadrant pain: Secondary | ICD-10-CM | POA: Diagnosis not present

## 2017-10-25 DIAGNOSIS — R350 Frequency of micturition: Secondary | ICD-10-CM | POA: Diagnosis not present

## 2018-01-16 DIAGNOSIS — E538 Deficiency of other specified B group vitamins: Secondary | ICD-10-CM | POA: Diagnosis not present

## 2018-01-16 DIAGNOSIS — E785 Hyperlipidemia, unspecified: Secondary | ICD-10-CM | POA: Diagnosis not present

## 2018-01-16 DIAGNOSIS — I10 Essential (primary) hypertension: Secondary | ICD-10-CM | POA: Diagnosis not present

## 2018-01-22 DIAGNOSIS — E039 Hypothyroidism, unspecified: Secondary | ICD-10-CM | POA: Diagnosis not present

## 2018-01-22 DIAGNOSIS — Z1211 Encounter for screening for malignant neoplasm of colon: Secondary | ICD-10-CM | POA: Diagnosis not present

## 2018-01-22 DIAGNOSIS — E538 Deficiency of other specified B group vitamins: Secondary | ICD-10-CM | POA: Diagnosis not present

## 2018-01-22 DIAGNOSIS — M48061 Spinal stenosis, lumbar region without neurogenic claudication: Secondary | ICD-10-CM | POA: Diagnosis not present

## 2018-01-22 DIAGNOSIS — E785 Hyperlipidemia, unspecified: Secondary | ICD-10-CM | POA: Diagnosis not present

## 2018-01-22 DIAGNOSIS — I10 Essential (primary) hypertension: Secondary | ICD-10-CM | POA: Diagnosis not present

## 2018-01-22 DIAGNOSIS — I700 Atherosclerosis of aorta: Secondary | ICD-10-CM | POA: Diagnosis not present

## 2018-01-22 DIAGNOSIS — F418 Other specified anxiety disorders: Secondary | ICD-10-CM | POA: Diagnosis not present

## 2018-01-22 DIAGNOSIS — K219 Gastro-esophageal reflux disease without esophagitis: Secondary | ICD-10-CM | POA: Diagnosis not present

## 2018-01-22 DIAGNOSIS — Z9884 Bariatric surgery status: Secondary | ICD-10-CM | POA: Diagnosis not present

## 2018-01-22 DIAGNOSIS — Z0000 Encounter for general adult medical examination without abnormal findings: Secondary | ICD-10-CM | POA: Diagnosis not present

## 2018-01-23 DIAGNOSIS — Z1211 Encounter for screening for malignant neoplasm of colon: Secondary | ICD-10-CM | POA: Diagnosis not present

## 2018-05-23 DIAGNOSIS — I700 Atherosclerosis of aorta: Secondary | ICD-10-CM | POA: Diagnosis not present

## 2018-05-23 DIAGNOSIS — I10 Essential (primary) hypertension: Secondary | ICD-10-CM | POA: Diagnosis not present

## 2018-05-23 DIAGNOSIS — M791 Myalgia, unspecified site: Secondary | ICD-10-CM | POA: Diagnosis not present

## 2018-05-23 DIAGNOSIS — K219 Gastro-esophageal reflux disease without esophagitis: Secondary | ICD-10-CM | POA: Diagnosis not present

## 2018-05-23 DIAGNOSIS — M25512 Pain in left shoulder: Secondary | ICD-10-CM | POA: Diagnosis not present

## 2018-05-23 DIAGNOSIS — Z9884 Bariatric surgery status: Secondary | ICD-10-CM | POA: Diagnosis not present

## 2018-05-23 DIAGNOSIS — E785 Hyperlipidemia, unspecified: Secondary | ICD-10-CM | POA: Diagnosis not present

## 2018-05-23 DIAGNOSIS — F3340 Major depressive disorder, recurrent, in remission, unspecified: Secondary | ICD-10-CM | POA: Diagnosis not present

## 2018-05-23 DIAGNOSIS — E039 Hypothyroidism, unspecified: Secondary | ICD-10-CM | POA: Diagnosis not present

## 2018-05-23 DIAGNOSIS — M48061 Spinal stenosis, lumbar region without neurogenic claudication: Secondary | ICD-10-CM | POA: Diagnosis not present

## 2018-05-23 DIAGNOSIS — E538 Deficiency of other specified B group vitamins: Secondary | ICD-10-CM | POA: Diagnosis not present

## 2018-05-23 DIAGNOSIS — R739 Hyperglycemia, unspecified: Secondary | ICD-10-CM | POA: Diagnosis not present

## 2018-05-29 DIAGNOSIS — R202 Paresthesia of skin: Secondary | ICD-10-CM | POA: Diagnosis not present

## 2018-05-29 DIAGNOSIS — R200 Anesthesia of skin: Secondary | ICD-10-CM | POA: Diagnosis not present

## 2018-05-29 DIAGNOSIS — M4722 Other spondylosis with radiculopathy, cervical region: Secondary | ICD-10-CM | POA: Diagnosis not present

## 2018-08-28 DIAGNOSIS — Z9884 Bariatric surgery status: Secondary | ICD-10-CM | POA: Diagnosis not present

## 2018-08-28 DIAGNOSIS — E785 Hyperlipidemia, unspecified: Secondary | ICD-10-CM | POA: Diagnosis not present

## 2018-08-28 DIAGNOSIS — K219 Gastro-esophageal reflux disease without esophagitis: Secondary | ICD-10-CM | POA: Diagnosis not present

## 2018-08-28 DIAGNOSIS — F3340 Major depressive disorder, recurrent, in remission, unspecified: Secondary | ICD-10-CM | POA: Diagnosis not present

## 2018-08-28 DIAGNOSIS — E538 Deficiency of other specified B group vitamins: Secondary | ICD-10-CM | POA: Diagnosis not present

## 2018-08-28 DIAGNOSIS — M48061 Spinal stenosis, lumbar region without neurogenic claudication: Secondary | ICD-10-CM | POA: Diagnosis not present

## 2018-08-28 DIAGNOSIS — I700 Atherosclerosis of aorta: Secondary | ICD-10-CM | POA: Diagnosis not present

## 2018-08-28 DIAGNOSIS — I10 Essential (primary) hypertension: Secondary | ICD-10-CM | POA: Diagnosis not present

## 2018-08-28 DIAGNOSIS — E039 Hypothyroidism, unspecified: Secondary | ICD-10-CM | POA: Diagnosis not present

## 2018-08-28 DIAGNOSIS — Z125 Encounter for screening for malignant neoplasm of prostate: Secondary | ICD-10-CM | POA: Diagnosis not present

## 2018-10-04 IMAGING — DX DG KNEE 1-2V PORT*R*
2 series · 2 of 2 positions shown · non-contrast
Comparison: None.

CLINICAL DATA: Status post right knee replacement

EXAM:
PORTABLE RIGHT KNEE - 1-2 VIEW

[knee ap]
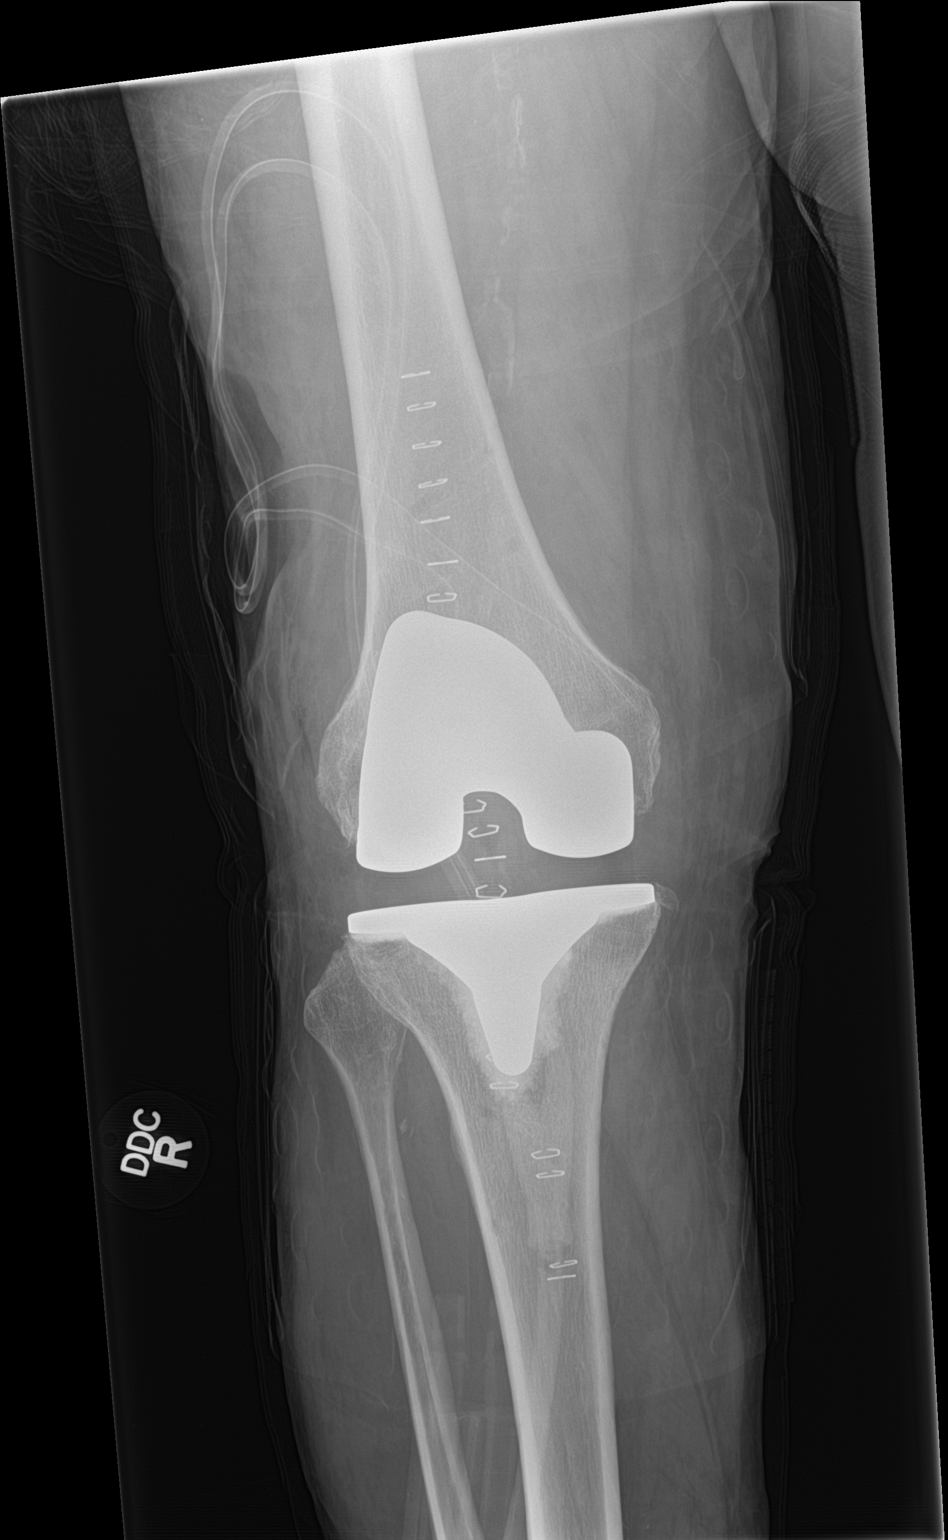

[knee lat]
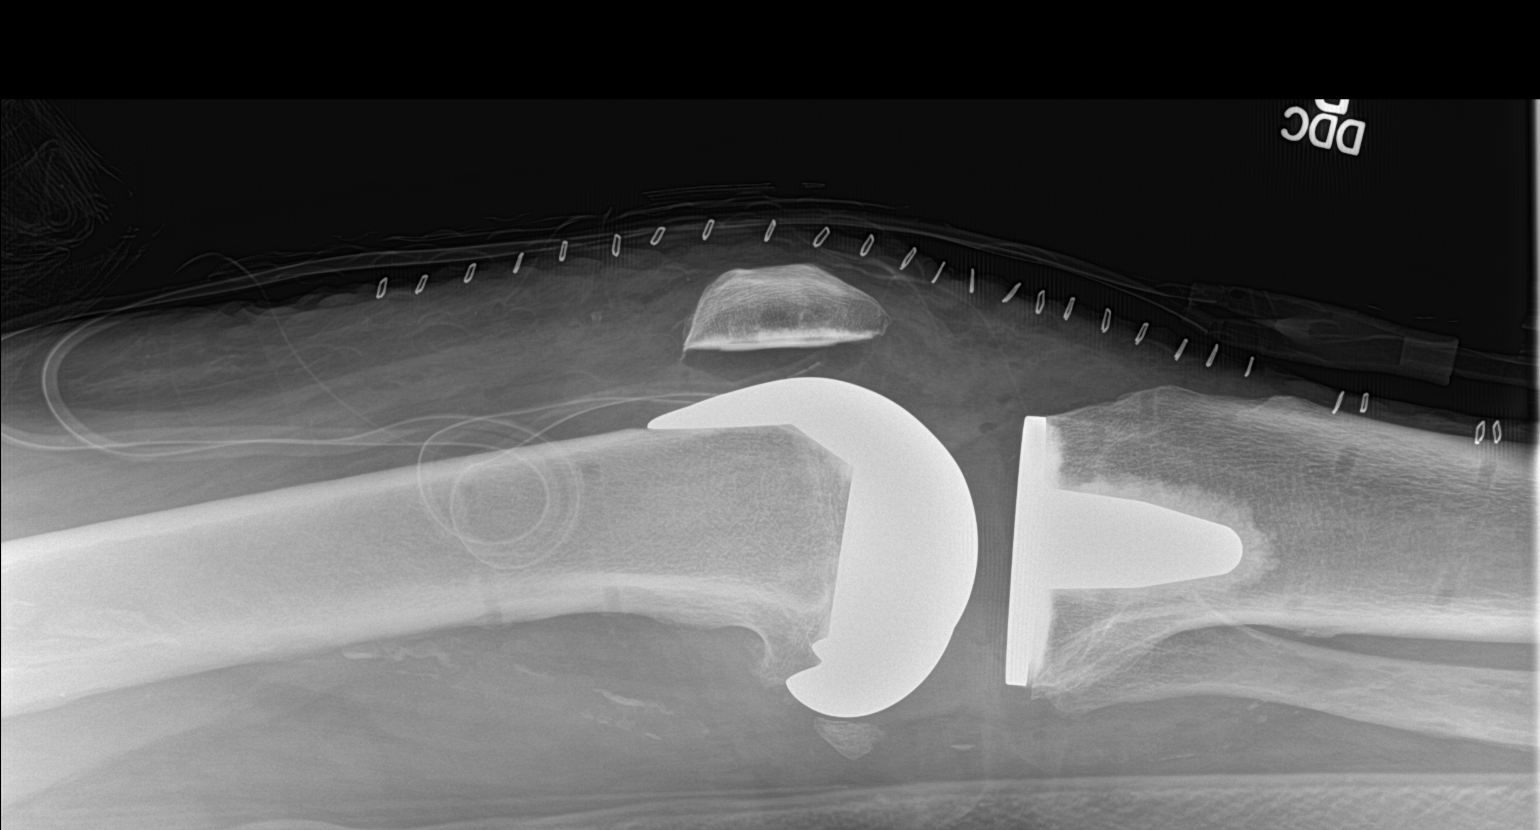

[2 of 2 positions shown; findings below may reference images not displayed]

FINDINGS: Right knee prosthesis is seen. Surgical drains are noted in place.
No acute bony or soft tissue abnormality is seen. Previous defects
from screw placement in the distal femur and proximal tibia are
noted.
IMPRESSION: Status post knee prosthesis without acute abnormality.

## 2018-11-11 IMAGING — CT CT ABD-PELV W/ CM
2 of 5 series · 16 of 46 positions shown, 18 images · IV contrast (APPLIED)
Comparison: None.

CLINICAL DATA: Upper abdominal pain for 3 weeks.

EXAM:
CT ABDOMEN AND PELVIS WITH CONTRAST
TECHNIQUE: Multidetector CT imaging of the abdomen and pelvis was performed
using the standard protocol following bolus administration of
intravenous contrast.
CONTRAST:  100mL [id number] IOPAMIDOL ([id number]) INJECTION 61%

[Series 5: axial st2 · axial · 0.84mm/px · z∈[-1212,-782]mm · 13 of 98 slices shown, 15 images]
[im 6/98  soft-tissue]
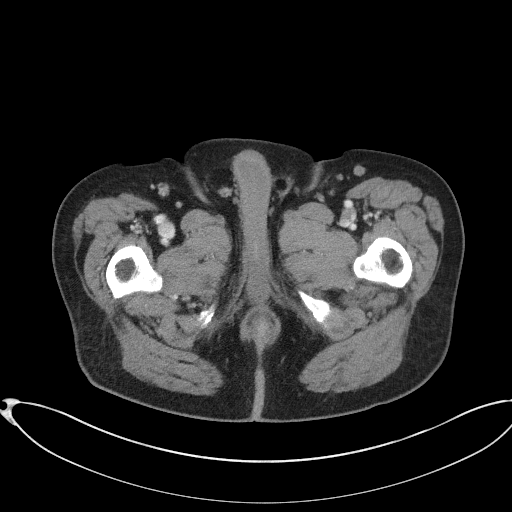
[im 6/98  bone]
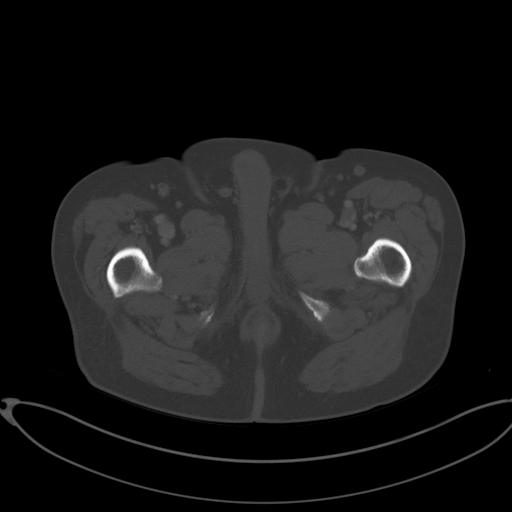
[im 12/98  soft-tissue]
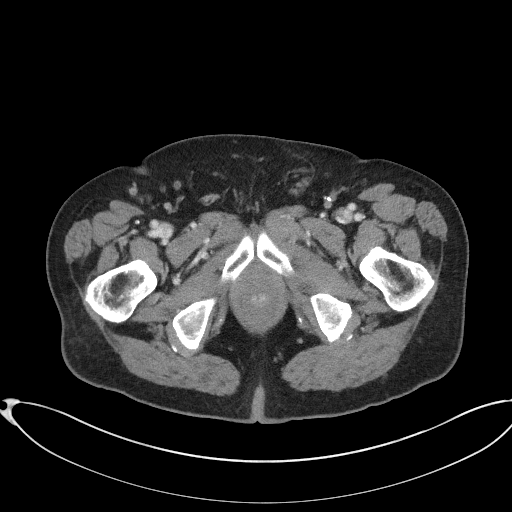
[im 23/98  soft-tissue]
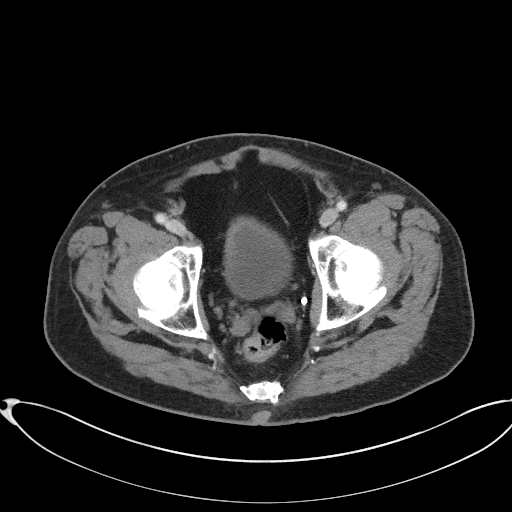
[im 29/98  soft-tissue]
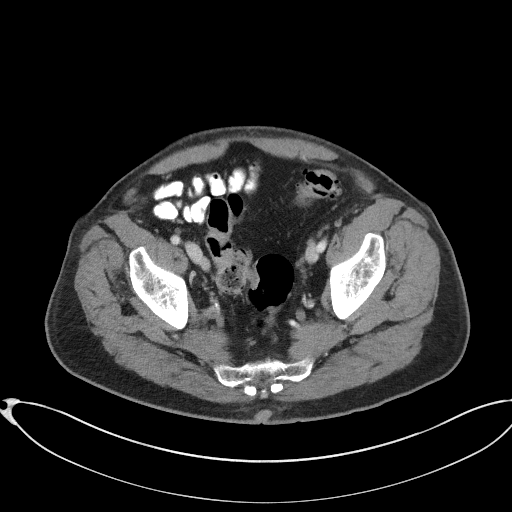
[im 35/98  soft-tissue]
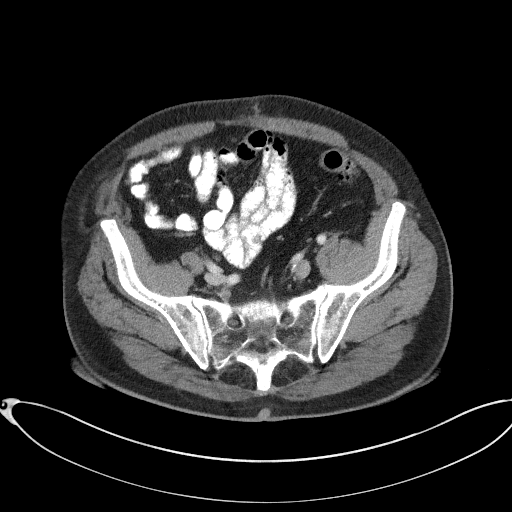
[im 40/98  soft-tissue]
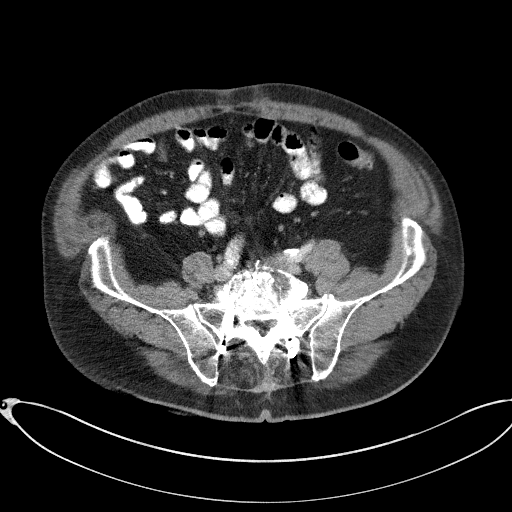
[im 52/98  soft-tissue]
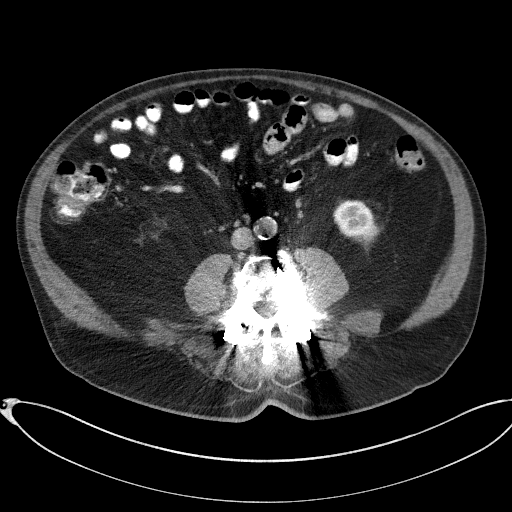
[im 58/98  soft-tissue]
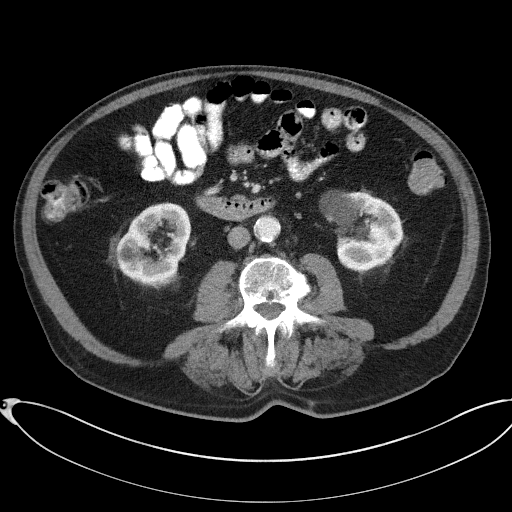
[im 63/98  soft-tissue]
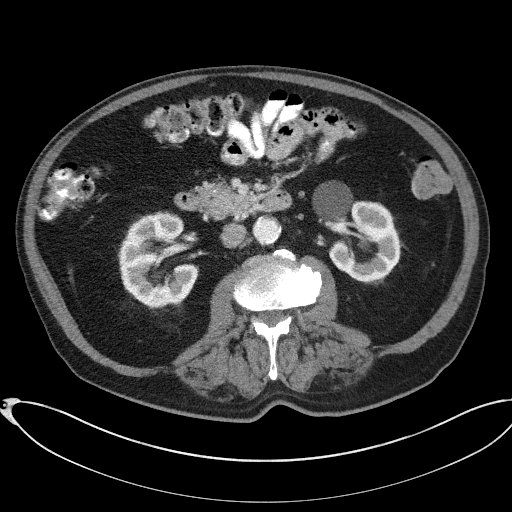
[im 63/98  bone]
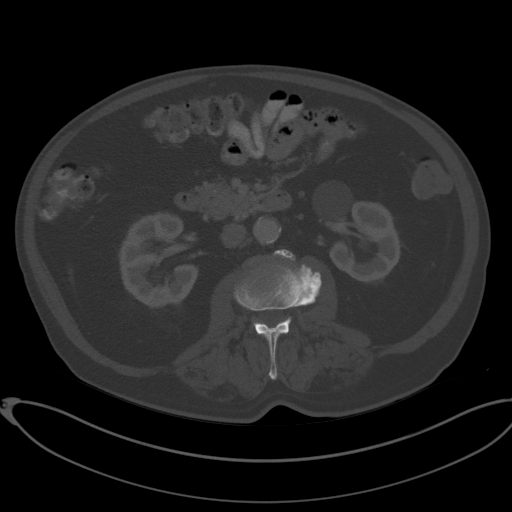
[im 69/98  soft-tissue]
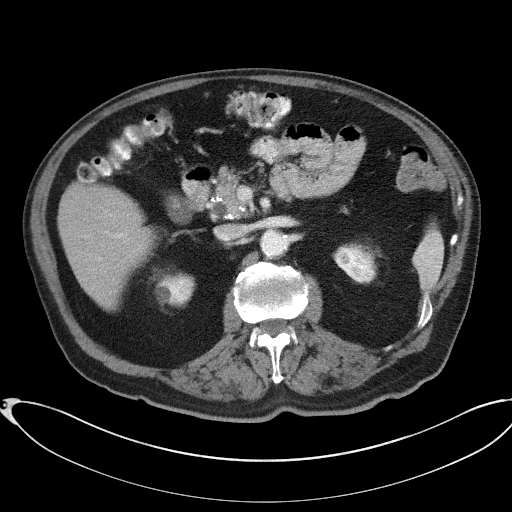
[im 75/98  soft-tissue]
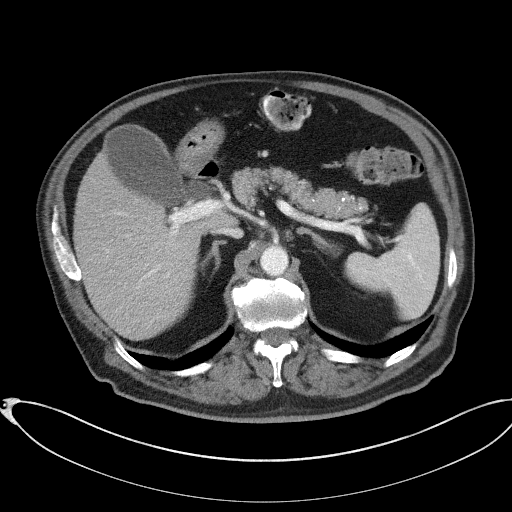
[im 86/98  soft-tissue]
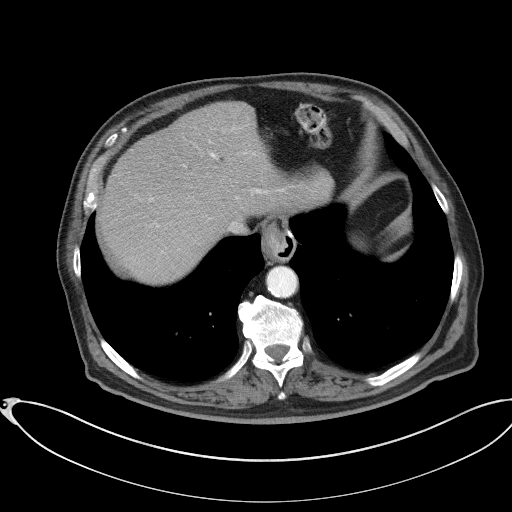
[im 92/98  soft-tissue]
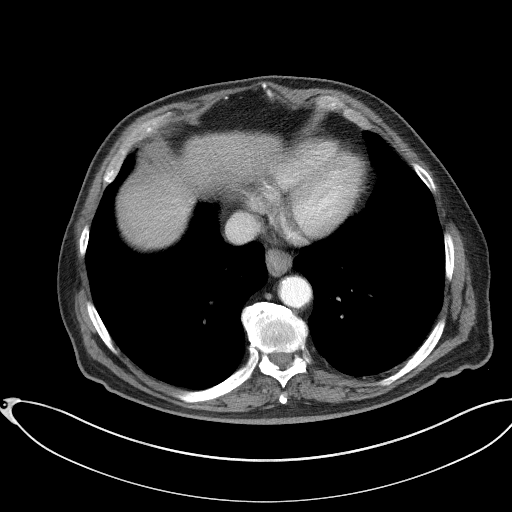

[Series 7: coronal st · coronal · 0.84mm/px · 3 of 100 slices shown]
[im 34/100  soft-tissue]
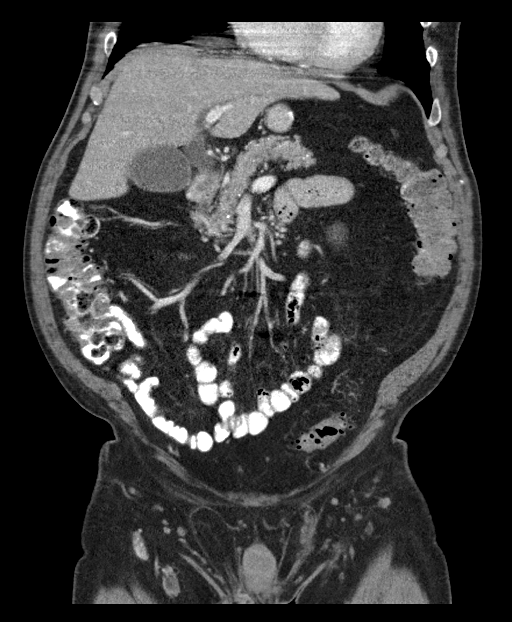
[im 45/100  soft-tissue]
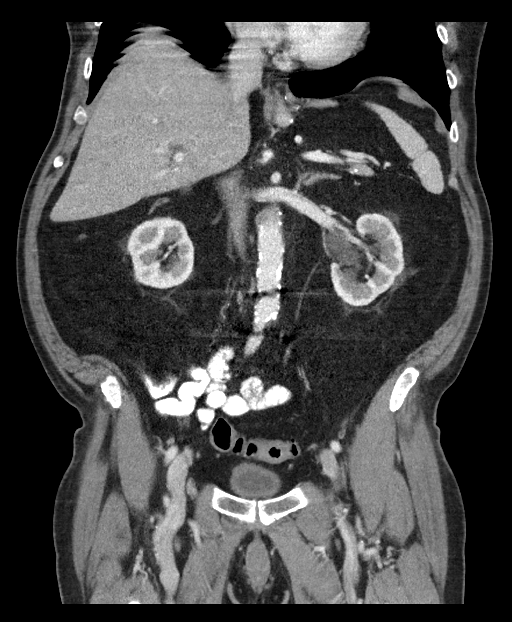
[im 56/100  soft-tissue]
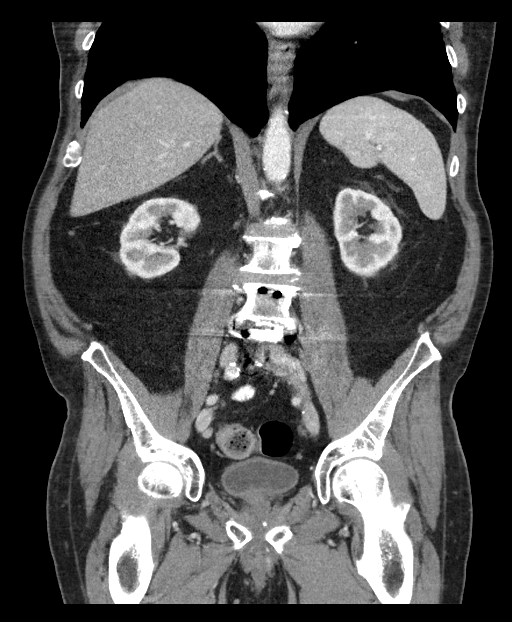

[16 of 46 positions shown; findings below may reference images not displayed]

FINDINGS: Lower chest: No acute abnormality.

Hepatobiliary: No focal liver abnormality is seen. No gallstones,
gallbladder wall thickening, or biliary dilatation.

Pancreas: Multiple calcifications are seen throughout the pancreas
suggesting chronic pancreatitis. No acute abnormality is noted.

Spleen: Normal in size without focal abnormality.

Adrenals/Urinary Tract: Adrenal glands appear normal. Bilateral
renal cysts are noted. No hydronephrosis or renal obstruction is
noted. No renal or ureteral calculi are noted. Urinary bladder
appears normal.

Stomach/Bowel: Small hiatal hernia is noted. Status post gastric
surgery. There is no evidence of bowel obstruction or inflammation.
Sigmoid diverticulosis is noted without inflammation. The appendix
is not visualized.

Vascular/Lymphatic: Aortic atherosclerosis. No enlarged abdominal or
pelvic lymph nodes.

Reproductive: Mild prostatic enlargement is noted.

Other: No abdominal wall hernia or abnormality. No abdominopelvic
ascites.

Musculoskeletal: No acute or significant osseous findings.
IMPRESSION: Pancreatic calcifications are noted suggesting chronic pancreatitis.

Small hiatal hernia.

Status post gastric surgery.

Sigmoid diverticulosis is noted without inflammation.

Aortic atherosclerosis.

## 2018-11-22 DIAGNOSIS — R21 Rash and other nonspecific skin eruption: Secondary | ICD-10-CM | POA: Diagnosis not present

## 2019-01-22 ENCOUNTER — Ambulatory Visit: Payer: PPO | Attending: Internal Medicine

## 2019-01-22 DIAGNOSIS — Z23 Encounter for immunization: Secondary | ICD-10-CM

## 2019-01-22 NOTE — Progress Notes (Signed)
   Covid-19 Vaccination Clinic  Name:  Braidon Chermak    MRN: [id number] DOB: 05/31/1945  01/22/2019  Mr. Dayrit was observed post Covid-19 immunization for 15 minutes without incidence. He was provided with Vaccine Information Sheet and instruction to access the V-Safe system.   Mr. Mehringer was instructed to call 911 with any severe reactions post vaccine: Marland Kitchen Difficulty breathing  . Swelling of your face and throat  . A fast heartbeat  . A bad rash all over your body  . Dizziness and weakness    Immunizations Administered    Name Date Dose VIS Date Route   Pfizer COVID-19 Vaccine 01/22/2019  3:46 PM 0.3 mL 12/13/2018 Intramuscular   Manufacturer: [employer], [employer]   Lot: [id number]   NDC: [address]-1

## 2019-02-03 DIAGNOSIS — G8929 Other chronic pain: Secondary | ICD-10-CM | POA: Diagnosis not present

## 2019-02-03 DIAGNOSIS — M48061 Spinal stenosis, lumbar region without neurogenic claudication: Secondary | ICD-10-CM | POA: Diagnosis not present

## 2019-02-03 DIAGNOSIS — Z125 Encounter for screening for malignant neoplasm of prostate: Secondary | ICD-10-CM | POA: Diagnosis not present

## 2019-02-03 DIAGNOSIS — I10 Essential (primary) hypertension: Secondary | ICD-10-CM | POA: Diagnosis not present

## 2019-02-03 DIAGNOSIS — Z9884 Bariatric surgery status: Secondary | ICD-10-CM | POA: Diagnosis not present

## 2019-02-03 DIAGNOSIS — E039 Hypothyroidism, unspecified: Secondary | ICD-10-CM | POA: Diagnosis not present

## 2019-02-03 DIAGNOSIS — M545 Low back pain: Secondary | ICD-10-CM | POA: Diagnosis not present

## 2019-02-03 DIAGNOSIS — E538 Deficiency of other specified B group vitamins: Secondary | ICD-10-CM | POA: Diagnosis not present

## 2019-02-03 DIAGNOSIS — E785 Hyperlipidemia, unspecified: Secondary | ICD-10-CM | POA: Diagnosis not present

## 2019-02-03 DIAGNOSIS — K219 Gastro-esophageal reflux disease without esophagitis: Secondary | ICD-10-CM | POA: Diagnosis not present

## 2019-02-03 DIAGNOSIS — I700 Atherosclerosis of aorta: Secondary | ICD-10-CM | POA: Diagnosis not present

## 2019-02-03 DIAGNOSIS — Z0000 Encounter for general adult medical examination without abnormal findings: Secondary | ICD-10-CM | POA: Diagnosis not present

## 2019-02-03 DIAGNOSIS — F3340 Major depressive disorder, recurrent, in remission, unspecified: Secondary | ICD-10-CM | POA: Diagnosis not present

## 2019-02-12 ENCOUNTER — Ambulatory Visit: Payer: PPO | Attending: Internal Medicine

## 2019-02-12 ENCOUNTER — Ambulatory Visit: Payer: PPO

## 2019-02-12 DIAGNOSIS — Z23 Encounter for immunization: Secondary | ICD-10-CM | POA: Insufficient documentation

## 2019-02-12 NOTE — Progress Notes (Signed)
   Covid-19 Vaccination Clinic  Name:  Jared Baldwin    MRN: [id number] DOB: 18-Mar-1945  02/12/2019  [name] was observed post Covid-19 immunization for 15 minutes without incidence. He was provided with Vaccine Information Sheet and instruction to access the V-Safe system.   [name] was instructed to call 911 with any severe reactions post vaccine: Marland Kitchen Difficulty breathing  . Swelling of your face and throat  . A fast heartbeat  . A bad rash all over your body  . Dizziness and weakness    Immunizations Administered    Name Date Dose VIS Date Route   Pfizer COVID-19 Vaccine 02/12/2019  3:00 PM 0.3 mL 12/13/2018 Intramuscular   Manufacturer: [employer], [employer]   Lot: [id number]   NDC: [address]-1

## 2019-05-23 DIAGNOSIS — H43812 Vitreous degeneration, left eye: Secondary | ICD-10-CM | POA: Diagnosis not present

## 2019-06-08 IMAGING — MR MR CERVICAL SPINE W/O CM
5 series · 34 of 48 positions shown · non-contrast
Comparison: None.

CLINICAL DATA: Bilateral upper extremity numbness, left greater
than right for 6 months. No known injury.

EXAM:
MRI CERVICAL SPINE WITHOUT CONTRAST
TECHNIQUE: Multiplanar, multisequence MR imaging of the cervical spine was
performed. No intravenous contrast was administered.

[Series 3: T2 · sagittal · 3.0mm · 0.70mm/px · 7 of 13 slices shown (1 of 2)]
[im 1/13]
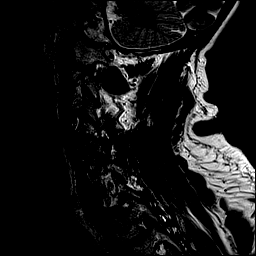
[im 3/13]
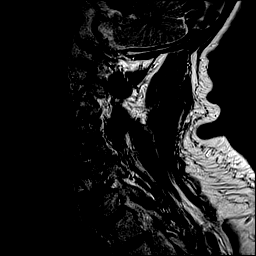
[im 5/13]
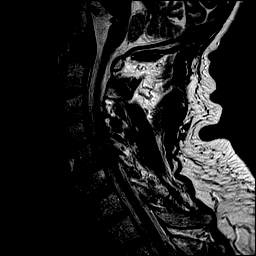
[im 7/13]
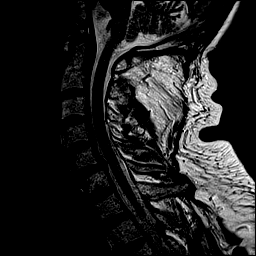
[im 9/13]
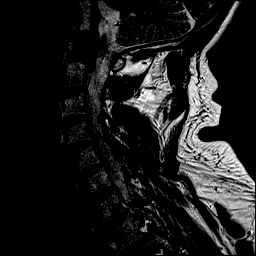
[im 11/13]
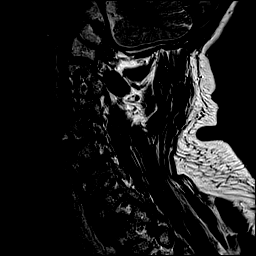
[im 13/13]
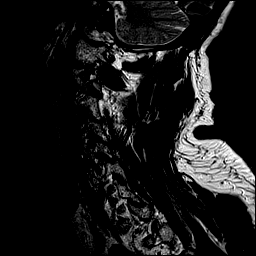

[Series 4: T1 · sagittal · 3.0mm · 0.70mm/px · 7 of 13 slices shown]
[im 1/13]
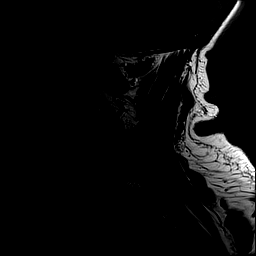
[im 3/13]
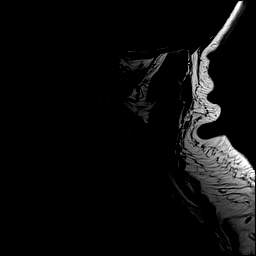
[im 5/13]
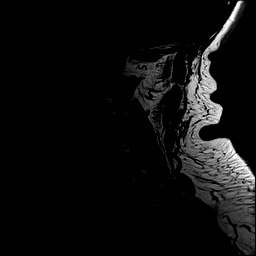
[im 7/13]
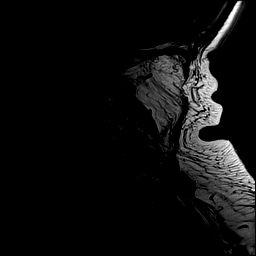
[im 9/13]
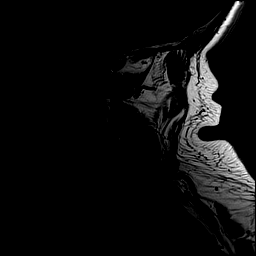
[im 11/13]
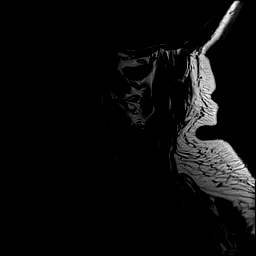
[im 13/13]
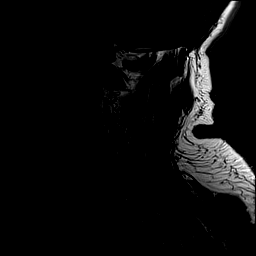

[Series 8: STIR · sagittal · 3.0mm · 0.35mm/px · 6 of 13 slices shown]
[im 1/13]
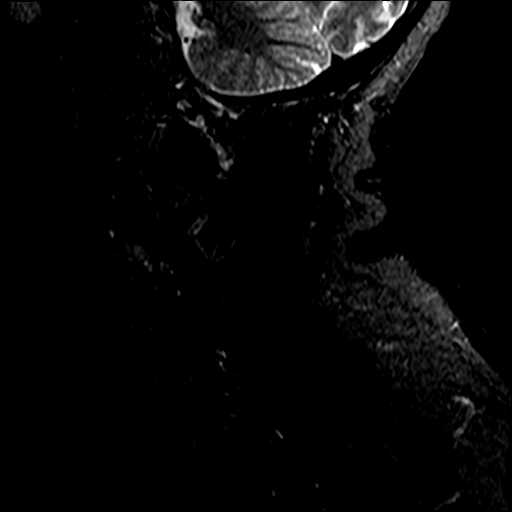
[im 3/13]
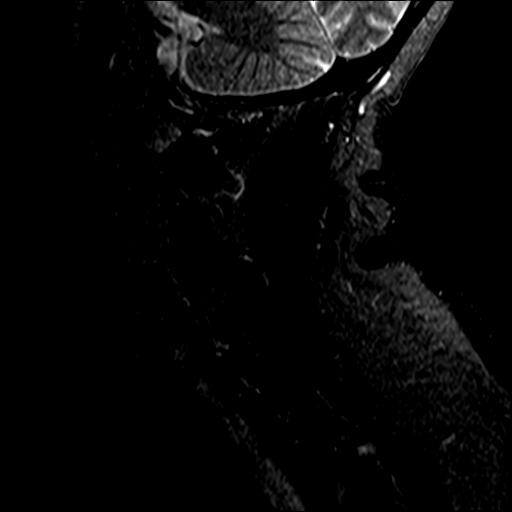
[im 5/13]
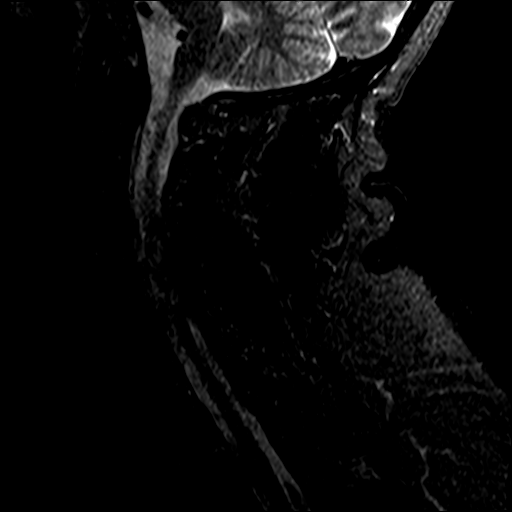
[im 8/13]
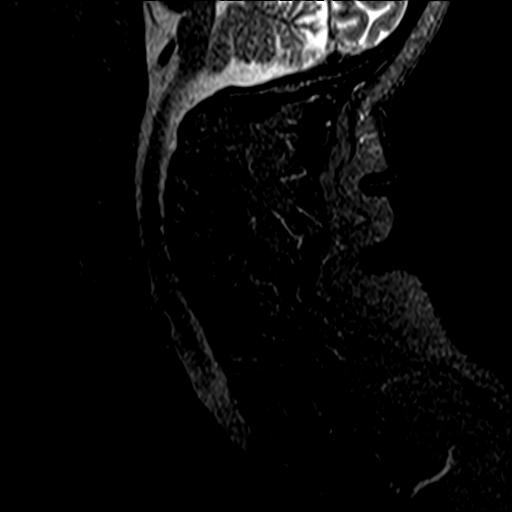
[im 10/13]
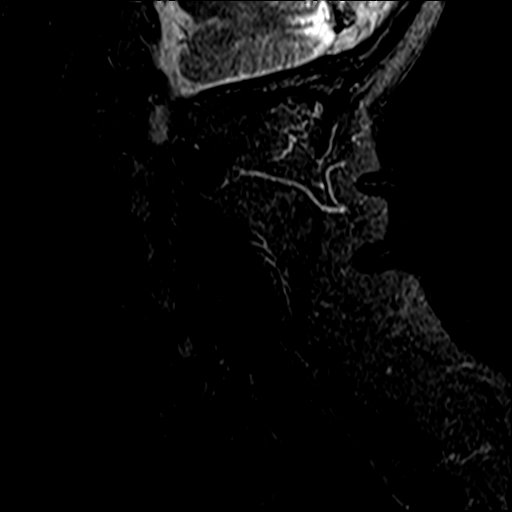
[im 13/13]
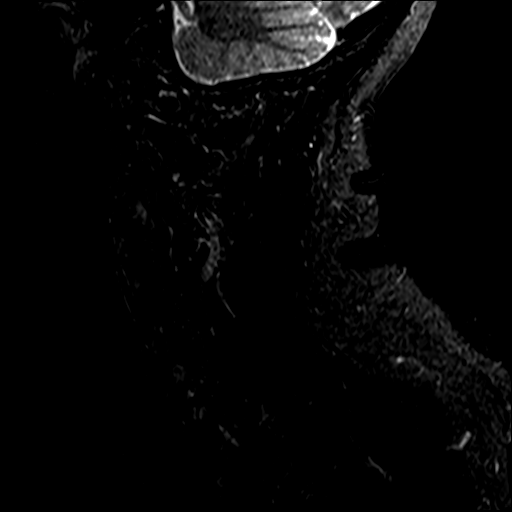

[Series 9: T2 · axial · 3.0mm · 0.70mm/px · z∈[-93,+13]mm · 8 of 29 slices shown (2 of 2)]
[im 1/29]
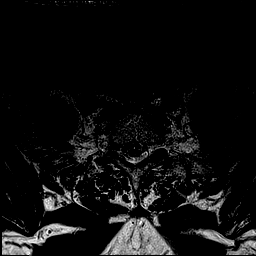
[im 5/29]
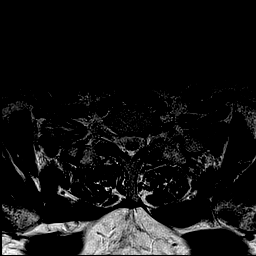
[im 9/29]
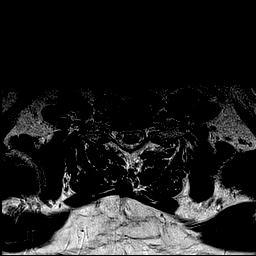
[im 13/29]
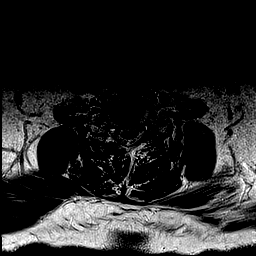
[im 16/29]
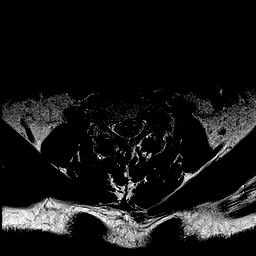
[im 20/29]
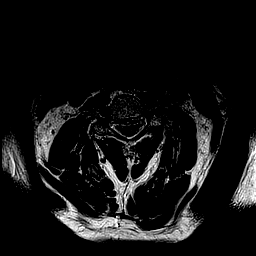
[im 24/29]
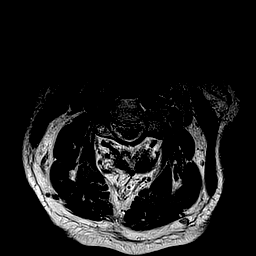
[im 29/29]
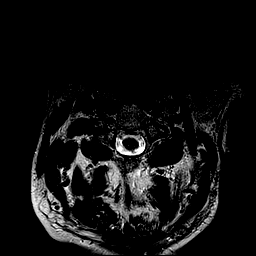

[Series 10: mpgr ax · axial · 3.0mm · 0.35mm/px · z∈[-93,-21]mm · 6 of 29 slices shown]
[im 1/29]
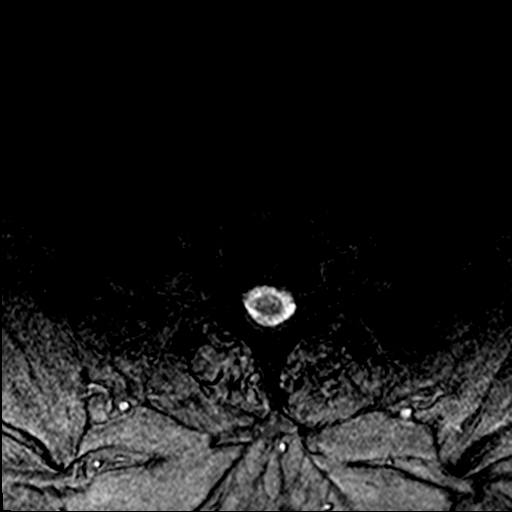
[im 5/29]
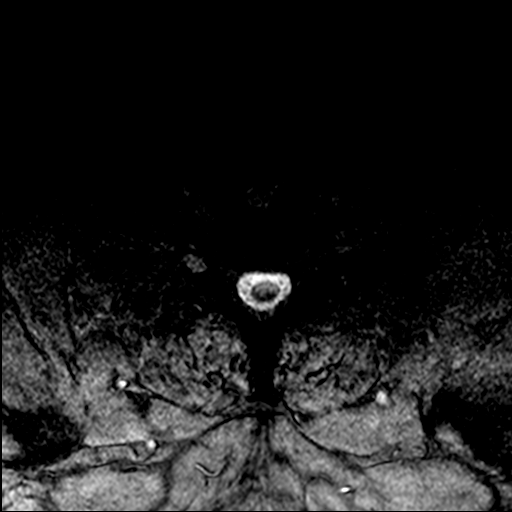
[im 9/29]
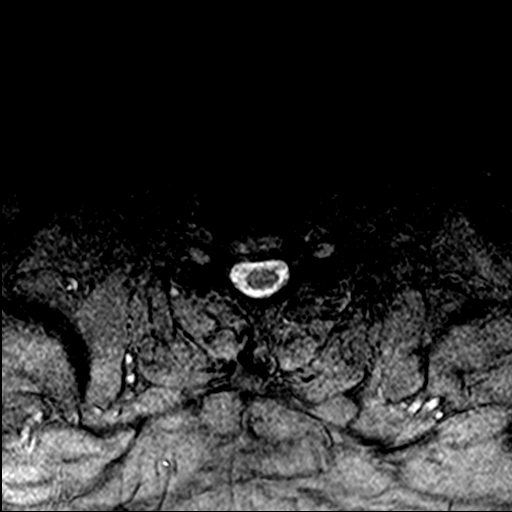
[im 13/29]
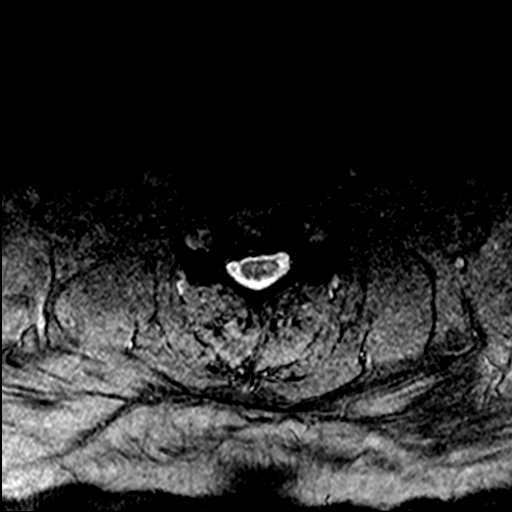
[im 16/29]
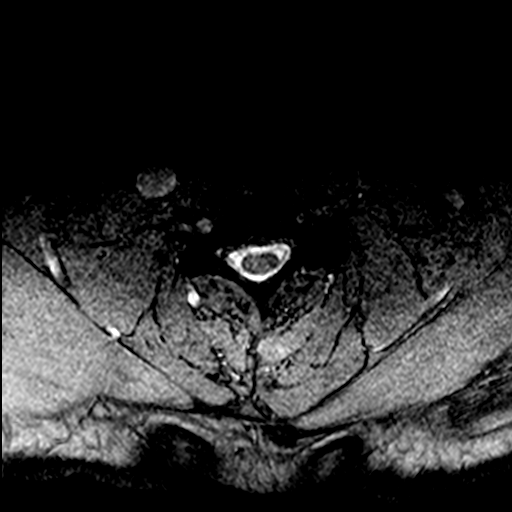
[im 20/29]
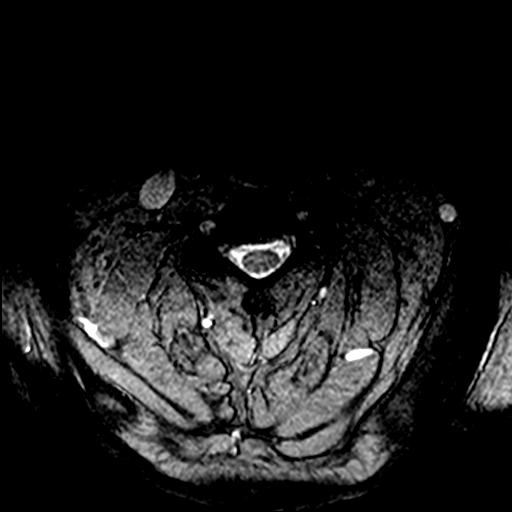

[34 of 48 positions shown; findings below may reference images not displayed]

FINDINGS: Alignment: 0.2 cm facet mediated anterolisthesis C5 on C6 is
identified. Alignment is otherwise maintained.

Vertebrae: No fracture or worrisome lesion.

Cord: Normal signal throughout.

Posterior Fossa, vertebral arteries, paraspinal tissues: Negative.

Disc levels:

C2-3: Negative.

C3-4: Bilateral facet degenerative changes present with mild marrow
edema in the right facets. Shallow disc bulge effaces the ventral
thecal sac. Uncovertebral disease and facet arthropathy cause
moderate left and moderately severe right foraminal narrowing.

C4-5: There is left worse than right facet degenerative change with
marrow edema in the left facet joints. Broad-based disc bulge and
bilateral uncovertebral spurring are identified. The ventral thecal
sac is effaced. Moderately severe to severe foraminal narrowing is
worse on the left.

C5-6: The disc is uncovered and bulging. Bilateral uncovertebral
disease and left much worse than right facet arthropathy are seen.
There is slight deformity of the ventral cord. Moderately severe to
severe bilateral foraminal narrowing is identified.

C6-7: Minimal disc bulge without stenosis.

C7-T1: There is some facet degenerative change. No disc bulge or
protrusion. The central canal and foramina are open.
IMPRESSION: Scattered facet degenerative change most notable on the right at
C3-4 and on the left at C4-5 where there is some marrow edema in the
facets.

Moderate left and moderately severe right foraminal narrowing at
C3-4 due to uncovertebral and facet degenerative change. Shallow
disc bulge effaces the ventral thecal sac at this level.

Moderately severe to severe bilateral foraminal narrowing at C4-5 is
worse on the left. The ventral thecal sac is effaced at this level.

Slight deformity of the ventral cord at C5-6 and moderately severe
to severe bilateral foraminal narrowing.

## 2019-06-16 ENCOUNTER — Other Ambulatory Visit: Payer: Self-pay

## 2019-06-16 ENCOUNTER — Ambulatory Visit: Payer: PPO | Admitting: Podiatry

## 2019-06-16 DIAGNOSIS — H6062 Unspecified chronic otitis externa, left ear: Secondary | ICD-10-CM | POA: Diagnosis not present

## 2019-06-16 DIAGNOSIS — H903 Sensorineural hearing loss, bilateral: Secondary | ICD-10-CM | POA: Diagnosis not present

## 2019-06-16 DIAGNOSIS — M79675 Pain in left toe(s): Secondary | ICD-10-CM | POA: Diagnosis not present

## 2019-06-16 DIAGNOSIS — M79674 Pain in right toe(s): Secondary | ICD-10-CM

## 2019-06-16 DIAGNOSIS — B351 Tinea unguium: Secondary | ICD-10-CM

## 2019-06-16 DIAGNOSIS — M7752 Other enthesopathy of left foot: Secondary | ICD-10-CM

## 2019-06-17 ENCOUNTER — Encounter: Payer: Self-pay | Admitting: Podiatry

## 2019-06-17 NOTE — Progress Notes (Signed)
Subjective:  Patient ID: Jared Baldwin, male    DOB: Feb 07, 1945,  MRN: [id number]  Chief Complaint  Patient presents with  . Callouses    pt has a possible corn between the 4th and 5th toees, as well as between the 2nd and 3rd on the left foot he wants looked at.    74 y.o. male presents with the above complaint.  Patient presents with a complaint of left fourth and fifth digit heloma molle.  Patient states is very painful especially when ambulating.  Is been going to to 3 months.  There is some shooting sensation associated with it.  Patient has tried applying Band-Aid but has not helped.  Patient has tried keeping it dry but has not helped.  He has not seen anyone else prior to see me.  He would like to discuss his treatment options.  He has tried some offloading pads as well which has not helped either.  His pain scale 6 out of 10.  His pain is shooting stabbing nature.   Review of Systems: Negative except as noted in the HPI. Denies N/V/F/Ch.  Past Medical History:  Diagnosis Date  . Arthritis   . GERD (gastroesophageal reflux disease)   . Hyperlipidemia   . Hypertension   . Hypothyroidism     Current Outpatient Medications:  .  Cyanocobalamin 5000 MCG/ML LIQD, Take 5,000 mcg by mouth daily., Disp: , Rfl:  .  gabapentin (NEURONTIN) 100 MG capsule, Take by mouth., Disp: , Rfl:  .  levothyroxine (SYNTHROID, LEVOTHROID) 88 MCG tablet, Take 88 mcg by mouth daily before breakfast., Disp: , Rfl:  .  omeprazole (PRILOSEC) 40 MG capsule, Take by mouth., Disp: , Rfl:  .  pantoprazole (PROTONIX) 40 MG tablet, Take by mouth., Disp: , Rfl:  .  pravastatin (PRAVACHOL) 80 MG tablet, Take 80 mg by mouth daily., Disp: , Rfl:  .  traZODone (DESYREL) 50 MG tablet, Take by mouth., Disp: , Rfl:   Social History   Tobacco Use  Smoking Status Former Smoker  . Packs/day: 2.00  . Years: 0.00  . Pack years: 0.00  . Types: Cigarettes  . Quit date: 07/11/1997  . Years since quitting: 21.9    Smokeless Tobacco Never Used    Allergies  Allergen Reactions  . Statins Other (See Comments)    Muscle pain with atorvastatin and simvastatin   Objective:  There were no vitals filed for this visit. There is no height or weight on file to calculate BMI. Constitutional Well developed. Well nourished.  Vascular Dorsalis pedis pulses palpable bilaterally. Posterior tibial pulses palpable bilaterally. Capillary refill normal to all digits.  No cyanosis or clubbing noted. Pedal hair growth normal.  Neurologic Normal speech. Oriented to person, place, and time. Epicritic sensation to light touch grossly present bilaterally.  Dermatologic  thickened elongated dystrophic mycotic painful to touch toenails x10 Hyperkeratotic lesion noted in between fourth and fifth digit left side.  Pain on palpation to the lesion.  No pinpoint bleeding noted.  Orthopedic: Normal joint ROM without pain or crepitus bilaterally. No visible deformities. No bony tenderness.   Radiographs: None Assessment:   1. Capsulitis of toe of left foot   2. Pain due to onychomycosis of toenails of both feet    Plan:  Patient was evaluated and treated and all questions answered.  Left fourth and fifth digit heloma with associated capsulitis -I explained to the patient the etiology of heloma molle and various treatment options were discussed.  I believe there  is inflammatory component associated with pain leading to capsulitis and formation of the hyperkeratotic lesion.  I believe patient will benefit from a steroid injection.  Patient agrees to the plan would like to proceed with a steroid injection followed by debridement of the lesion -A steroid injection was performed at left fifth toe using 1% plain Lidocaine and 10 mg of Kenalog. This was well tolerated.  Patient was evaluated and treated and all questions answered.  Onychomycosis with pain  -Nails palliatively debrided as below. -Educated on  self-care  Procedure: Nail Debridement Rationale: pain  Type of Debridement: manual, sharp debridement. Instrumentation: Nail nipper, rotary burr. Number of Nails: 10  Procedures and Treatment: Consent by patient was obtained for treatment procedures. The patient understood the discussion of treatment and procedures well. All questions were answered thoroughly reviewed. Debridement of mycotic and hypertrophic toenails, 1 through 5 bilateral and clearing of subungual debris. No ulceration, no infection noted.  Return Visit-Office Procedure: Patient instructed to return to the office for a follow up visit 3 months for continued evaluation and treatment.  Nicholes Rough, DPM    No follow-ups on file.  No follow-ups on file.

## 2019-08-18 DIAGNOSIS — I10 Essential (primary) hypertension: Secondary | ICD-10-CM | POA: Diagnosis not present

## 2019-08-18 DIAGNOSIS — E785 Hyperlipidemia, unspecified: Secondary | ICD-10-CM | POA: Diagnosis not present

## 2019-08-18 DIAGNOSIS — K219 Gastro-esophageal reflux disease without esophagitis: Secondary | ICD-10-CM | POA: Diagnosis not present

## 2019-08-18 DIAGNOSIS — M25512 Pain in left shoulder: Secondary | ICD-10-CM | POA: Diagnosis not present

## 2019-08-18 DIAGNOSIS — M48061 Spinal stenosis, lumbar region without neurogenic claudication: Secondary | ICD-10-CM | POA: Diagnosis not present

## 2019-08-18 DIAGNOSIS — I700 Atherosclerosis of aorta: Secondary | ICD-10-CM | POA: Diagnosis not present

## 2019-08-18 DIAGNOSIS — R351 Nocturia: Secondary | ICD-10-CM | POA: Diagnosis not present

## 2019-08-18 DIAGNOSIS — F3340 Major depressive disorder, recurrent, in remission, unspecified: Secondary | ICD-10-CM | POA: Diagnosis not present

## 2019-08-18 DIAGNOSIS — E538 Deficiency of other specified B group vitamins: Secondary | ICD-10-CM | POA: Diagnosis not present

## 2019-08-18 DIAGNOSIS — M79642 Pain in left hand: Secondary | ICD-10-CM | POA: Diagnosis not present

## 2019-08-18 DIAGNOSIS — N3941 Urge incontinence: Secondary | ICD-10-CM | POA: Diagnosis not present

## 2019-08-18 DIAGNOSIS — E039 Hypothyroidism, unspecified: Secondary | ICD-10-CM | POA: Diagnosis not present

## 2019-08-27 ENCOUNTER — Ambulatory Visit: Payer: PPO | Admitting: Urology

## 2019-09-03 DIAGNOSIS — M25512 Pain in left shoulder: Secondary | ICD-10-CM | POA: Diagnosis not present

## 2019-09-03 DIAGNOSIS — D72819 Decreased white blood cell count, unspecified: Secondary | ICD-10-CM | POA: Diagnosis not present

## 2019-09-03 DIAGNOSIS — D696 Thrombocytopenia, unspecified: Secondary | ICD-10-CM | POA: Diagnosis not present

## 2019-09-03 DIAGNOSIS — R7989 Other specified abnormal findings of blood chemistry: Secondary | ICD-10-CM | POA: Diagnosis not present

## 2019-09-21 DIAGNOSIS — Z1211 Encounter for screening for malignant neoplasm of colon: Secondary | ICD-10-CM | POA: Diagnosis not present

## 2019-09-22 DIAGNOSIS — D649 Anemia, unspecified: Secondary | ICD-10-CM | POA: Diagnosis not present

## 2019-09-25 DIAGNOSIS — I10 Essential (primary) hypertension: Secondary | ICD-10-CM | POA: Diagnosis not present

## 2019-09-25 DIAGNOSIS — E538 Deficiency of other specified B group vitamins: Secondary | ICD-10-CM | POA: Diagnosis not present

## 2019-09-25 DIAGNOSIS — E039 Hypothyroidism, unspecified: Secondary | ICD-10-CM | POA: Diagnosis not present

## 2019-09-25 DIAGNOSIS — M48061 Spinal stenosis, lumbar region without neurogenic claudication: Secondary | ICD-10-CM | POA: Diagnosis not present

## 2019-09-25 DIAGNOSIS — Z1211 Encounter for screening for malignant neoplasm of colon: Secondary | ICD-10-CM | POA: Diagnosis not present

## 2019-09-25 DIAGNOSIS — F3340 Major depressive disorder, recurrent, in remission, unspecified: Secondary | ICD-10-CM | POA: Diagnosis not present

## 2019-09-25 DIAGNOSIS — Z23 Encounter for immunization: Secondary | ICD-10-CM | POA: Diagnosis not present

## 2019-09-25 DIAGNOSIS — I700 Atherosclerosis of aorta: Secondary | ICD-10-CM | POA: Diagnosis not present

## 2019-09-25 DIAGNOSIS — E785 Hyperlipidemia, unspecified: Secondary | ICD-10-CM | POA: Diagnosis not present

## 2019-09-26 LAB — EXTERNAL GENERIC LAB PROCEDURE: COLOGUARD: NEGATIVE

## 2019-09-26 LAB — COLOGUARD: COLOGUARD: NEGATIVE

## 2019-09-30 DIAGNOSIS — E039 Hypothyroidism, unspecified: Secondary | ICD-10-CM | POA: Diagnosis not present

## 2019-09-30 DIAGNOSIS — Z1211 Encounter for screening for malignant neoplasm of colon: Secondary | ICD-10-CM | POA: Diagnosis not present

## 2019-09-30 DIAGNOSIS — I700 Atherosclerosis of aorta: Secondary | ICD-10-CM | POA: Diagnosis not present

## 2019-09-30 DIAGNOSIS — Z23 Encounter for immunization: Secondary | ICD-10-CM | POA: Diagnosis not present

## 2019-09-30 DIAGNOSIS — F3340 Major depressive disorder, recurrent, in remission, unspecified: Secondary | ICD-10-CM | POA: Diagnosis not present

## 2019-09-30 DIAGNOSIS — M48061 Spinal stenosis, lumbar region without neurogenic claudication: Secondary | ICD-10-CM | POA: Diagnosis not present

## 2019-09-30 DIAGNOSIS — E785 Hyperlipidemia, unspecified: Secondary | ICD-10-CM | POA: Diagnosis not present

## 2019-09-30 DIAGNOSIS — I10 Essential (primary) hypertension: Secondary | ICD-10-CM | POA: Diagnosis not present

## 2019-09-30 DIAGNOSIS — E538 Deficiency of other specified B group vitamins: Secondary | ICD-10-CM | POA: Diagnosis not present

## 2019-10-06 DIAGNOSIS — G5602 Carpal tunnel syndrome, left upper limb: Secondary | ICD-10-CM | POA: Diagnosis not present

## 2019-10-13 DIAGNOSIS — M722 Plantar fascial fibromatosis: Secondary | ICD-10-CM | POA: Diagnosis not present

## 2019-10-15 ENCOUNTER — Other Ambulatory Visit: Payer: Self-pay | Admitting: Internal Medicine

## 2019-10-15 ENCOUNTER — Other Ambulatory Visit (HOSPITAL_COMMUNITY): Payer: Self-pay | Admitting: Internal Medicine

## 2019-10-15 DIAGNOSIS — M48061 Spinal stenosis, lumbar region without neurogenic claudication: Secondary | ICD-10-CM | POA: Diagnosis not present

## 2019-10-15 DIAGNOSIS — I10 Essential (primary) hypertension: Secondary | ICD-10-CM | POA: Diagnosis not present

## 2019-10-15 DIAGNOSIS — Z9884 Bariatric surgery status: Secondary | ICD-10-CM | POA: Diagnosis not present

## 2019-10-15 DIAGNOSIS — E039 Hypothyroidism, unspecified: Secondary | ICD-10-CM | POA: Diagnosis not present

## 2019-10-15 DIAGNOSIS — Z125 Encounter for screening for malignant neoplasm of prostate: Secondary | ICD-10-CM | POA: Diagnosis not present

## 2019-10-15 DIAGNOSIS — K219 Gastro-esophageal reflux disease without esophagitis: Secondary | ICD-10-CM | POA: Diagnosis not present

## 2019-10-15 DIAGNOSIS — M7989 Other specified soft tissue disorders: Secondary | ICD-10-CM

## 2019-10-15 DIAGNOSIS — R600 Localized edema: Secondary | ICD-10-CM | POA: Diagnosis not present

## 2019-10-15 DIAGNOSIS — E538 Deficiency of other specified B group vitamins: Secondary | ICD-10-CM | POA: Diagnosis not present

## 2019-10-15 DIAGNOSIS — Z23 Encounter for immunization: Secondary | ICD-10-CM | POA: Diagnosis not present

## 2019-10-15 DIAGNOSIS — I700 Atherosclerosis of aorta: Secondary | ICD-10-CM | POA: Diagnosis not present

## 2019-10-15 DIAGNOSIS — F3340 Major depressive disorder, recurrent, in remission, unspecified: Secondary | ICD-10-CM | POA: Diagnosis not present

## 2019-10-15 DIAGNOSIS — E785 Hyperlipidemia, unspecified: Secondary | ICD-10-CM | POA: Diagnosis not present

## 2019-10-17 ENCOUNTER — Ambulatory Visit: Payer: PPO

## 2019-10-17 ENCOUNTER — Ambulatory Visit
Admission: RE | Admit: 2019-10-17 | Discharge: 2019-10-17 | Disposition: A | Discharge status: Home / Self Care | Payer: PPO | Source: Ambulatory Visit | Attending: Internal Medicine | Admitting: Internal Medicine

## 2019-10-17 ENCOUNTER — Other Ambulatory Visit: Payer: Self-pay

## 2019-10-17 DIAGNOSIS — I824Z1 Acute embolism and thrombosis of unspecified deep veins of right distal lower extremity: Secondary | ICD-10-CM | POA: Diagnosis not present

## 2019-10-17 DIAGNOSIS — M7989 Other specified soft tissue disorders: Secondary | ICD-10-CM | POA: Insufficient documentation

## 2019-12-15 DIAGNOSIS — E039 Hypothyroidism, unspecified: Secondary | ICD-10-CM | POA: Diagnosis not present

## 2019-12-15 DIAGNOSIS — I700 Atherosclerosis of aorta: Secondary | ICD-10-CM | POA: Diagnosis not present

## 2019-12-15 DIAGNOSIS — K219 Gastro-esophageal reflux disease without esophagitis: Secondary | ICD-10-CM | POA: Diagnosis not present

## 2019-12-15 DIAGNOSIS — I10 Essential (primary) hypertension: Secondary | ICD-10-CM | POA: Diagnosis not present

## 2019-12-15 DIAGNOSIS — M48061 Spinal stenosis, lumbar region without neurogenic claudication: Secondary | ICD-10-CM | POA: Diagnosis not present

## 2020-01-29 DIAGNOSIS — M75101 Unspecified rotator cuff tear or rupture of right shoulder, not specified as traumatic: Secondary | ICD-10-CM | POA: Diagnosis not present

## 2020-01-29 DIAGNOSIS — M75102 Unspecified rotator cuff tear or rupture of left shoulder, not specified as traumatic: Secondary | ICD-10-CM | POA: Diagnosis not present

## 2020-01-29 DIAGNOSIS — M19011 Primary osteoarthritis, right shoulder: Secondary | ICD-10-CM | POA: Diagnosis not present

## 2020-01-29 DIAGNOSIS — M19012 Primary osteoarthritis, left shoulder: Secondary | ICD-10-CM | POA: Diagnosis not present

## 2020-01-29 DIAGNOSIS — M25511 Pain in right shoulder: Secondary | ICD-10-CM | POA: Diagnosis not present

## 2020-01-29 DIAGNOSIS — G5602 Carpal tunnel syndrome, left upper limb: Secondary | ICD-10-CM | POA: Diagnosis not present

## 2020-02-18 DIAGNOSIS — E538 Deficiency of other specified B group vitamins: Secondary | ICD-10-CM | POA: Diagnosis not present

## 2020-02-18 DIAGNOSIS — E785 Hyperlipidemia, unspecified: Secondary | ICD-10-CM | POA: Diagnosis not present

## 2020-02-18 DIAGNOSIS — I10 Essential (primary) hypertension: Secondary | ICD-10-CM | POA: Diagnosis not present

## 2020-02-18 DIAGNOSIS — Z125 Encounter for screening for malignant neoplasm of prostate: Secondary | ICD-10-CM | POA: Diagnosis not present

## 2020-02-25 ENCOUNTER — Ambulatory Visit: Payer: PPO | Admitting: Dermatology

## 2020-02-25 ENCOUNTER — Encounter: Payer: Self-pay | Admitting: Dermatology

## 2020-02-25 ENCOUNTER — Other Ambulatory Visit: Payer: Self-pay

## 2020-02-25 DIAGNOSIS — M545 Low back pain, unspecified: Secondary | ICD-10-CM | POA: Diagnosis not present

## 2020-02-25 DIAGNOSIS — L219 Seborrheic dermatitis, unspecified: Secondary | ICD-10-CM

## 2020-02-25 DIAGNOSIS — L821 Other seborrheic keratosis: Secondary | ICD-10-CM | POA: Diagnosis not present

## 2020-02-25 DIAGNOSIS — B360 Pityriasis versicolor: Secondary | ICD-10-CM | POA: Diagnosis not present

## 2020-02-25 DIAGNOSIS — F3340 Major depressive disorder, recurrent, in remission, unspecified: Secondary | ICD-10-CM | POA: Diagnosis not present

## 2020-02-25 DIAGNOSIS — M48061 Spinal stenosis, lumbar region without neurogenic claudication: Secondary | ICD-10-CM | POA: Diagnosis not present

## 2020-02-25 DIAGNOSIS — E039 Hypothyroidism, unspecified: Secondary | ICD-10-CM | POA: Diagnosis not present

## 2020-02-25 DIAGNOSIS — K219 Gastro-esophageal reflux disease without esophagitis: Secondary | ICD-10-CM | POA: Diagnosis not present

## 2020-02-25 DIAGNOSIS — L578 Other skin changes due to chronic exposure to nonionizing radiation: Secondary | ICD-10-CM

## 2020-02-25 DIAGNOSIS — Z0000 Encounter for general adult medical examination without abnormal findings: Secondary | ICD-10-CM | POA: Diagnosis not present

## 2020-02-25 DIAGNOSIS — E785 Hyperlipidemia, unspecified: Secondary | ICD-10-CM | POA: Diagnosis not present

## 2020-02-25 DIAGNOSIS — Z1283 Encounter for screening for malignant neoplasm of skin: Secondary | ICD-10-CM | POA: Diagnosis not present

## 2020-02-25 DIAGNOSIS — R42 Dizziness and giddiness: Secondary | ICD-10-CM | POA: Diagnosis not present

## 2020-02-25 DIAGNOSIS — I10 Essential (primary) hypertension: Secondary | ICD-10-CM | POA: Diagnosis not present

## 2020-02-25 DIAGNOSIS — E538 Deficiency of other specified B group vitamins: Secondary | ICD-10-CM | POA: Diagnosis not present

## 2020-02-25 DIAGNOSIS — N401 Enlarged prostate with lower urinary tract symptoms: Secondary | ICD-10-CM | POA: Diagnosis not present

## 2020-02-25 DIAGNOSIS — I700 Atherosclerosis of aorta: Secondary | ICD-10-CM | POA: Diagnosis not present

## 2020-02-25 MED ORDER — KETOCONAZOLE 2 % EX CREA: 1.0000 "application " | TOPICAL_CREAM | Freq: Two times a day (BID) | CUTANEOUS | 0 refills | Status: DC

## 2020-02-25 MED ORDER — KETOCONAZOLE 2 % EX SHAM: 1.0000 "application " | MEDICATED_SHAMPOO | Freq: Once | CUTANEOUS | 0 refills | Status: AC

## 2020-02-25 MED ORDER — HYDROCORTISONE 2.5 % EX CREA: TOPICAL_CREAM | Freq: Two times a day (BID) | CUTANEOUS | 3 refills | Status: DC | PRN

## 2020-02-25 NOTE — Patient Instructions (Addendum)
Lather Ketoconazole 2% shampoo on body, 2-3 times a weekweek, leave on 5-8 minutes, rinse well. Use once  month for maintenance once under control.   Seborrheic Dermatitis  -  is a chronic persistent rash characterized by pinkness and scaling most commonly of the mid face but also can occur on the scalp (dandruff), ears; mid chest and mid back. It tends to be exacerbated by stress and cooler weather.  People who have neurologic disease may experience new onset or exacerbation of existing seborrheic dermatitis.  The condition is not curable but treatable and can be controlled.

## 2020-02-25 NOTE — Progress Notes (Signed)
   New Patient Visit  Subjective  Jared Baldwin is a 75 y.o. male who presents for the following: Rash (Chest. Patient's wife thinks could be fungus. No current Tx. Itches at times. Comes and goes.  Has been seen here in the past for it years ago.) and Dermatitis (Bearded area. Gets pink and flaky a few days after shaving. Comes and goes). He also has spots on his trunk he would like checked. The patient presents for Upper Body Skin Exam (UBSE) for skin cancer screening and mole check.  Wife with patient who contributes to history.   Objective  Well appearing patient in no apparent distress; mood and affect are within normal limits.  Review of Systems: No other skin or systemic complaints except as noted in HPI or Assessment and Plan.  All skin waist up examined.  Objective  upper chest: Scattered pink scaly patches  Objective  chin, bearded area: Pink patches with greasy scale.   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 Use once  month for maintenance once under control.   ketoconazole (NIZORAL) 2 % shampoo - upper chest  Seborrheic dermatitis chin, bearded area HC 2.5% cream BID to face T, TH SA Ketoconazole 2% cream BID to face M W F  Seborrheic Dermatitis  -  is a chronic persistent rash characterized by pinkness and scaling most commonly of the mid face but also can occur on the scalp (dandruff), ears; mid chest and mid back. It tends to be exacerbated by stress and cooler weather.  People who have neurologic disease may experience new onset or exacerbation of existing seborrheic dermatitis.  The condition is not curable but treatable and can be controlled.  hydrocortisone 2.5 % cream - chin, bearded area  ketoconazole (NIZORAL) 2 % cream - chin, bearded area  Skin cancer screening  Actinic Damage - chronic, secondary to  cumulative UV radiation exposure/sun exposure over time - diffuse scaly erythematous macules with underlying dyspigmentation - Recommend daily broad spectrum sunscreen SPF 30+ to sun-exposed areas, reapply every 2 hours as needed.  - Call for new or changing lesions.  Seborrheic Keratoses - Stuck-on, waxy, tan-brown papules and plaques  - Discussed benign etiology and prognosis. - Observe - Call for any changes  Return in about 1 year (around 02/24/2021).   I, Lawson Radar, CMA, am acting as scribe for Armida Sans, MD.  Documentation: I have reviewed the above documentation for accuracy and completeness, and I agree with the above.  Armida Sans, MD

## 2020-02-26 ENCOUNTER — Other Ambulatory Visit: Payer: Self-pay | Admitting: Internal Medicine

## 2020-02-26 DIAGNOSIS — I10 Essential (primary) hypertension: Secondary | ICD-10-CM

## 2020-02-26 DIAGNOSIS — R42 Dizziness and giddiness: Secondary | ICD-10-CM

## 2020-02-28 ENCOUNTER — Encounter: Payer: Self-pay | Admitting: Dermatology

## 2020-03-10 ENCOUNTER — Ambulatory Visit: Payer: PPO | Admitting: Urology

## 2020-03-11 ENCOUNTER — Encounter: Payer: Self-pay | Admitting: Urology

## 2020-03-11 ENCOUNTER — Ambulatory Visit: Payer: PPO | Admitting: Urology

## 2020-03-11 ENCOUNTER — Other Ambulatory Visit: Payer: Self-pay

## 2020-03-11 VITALS — BP 149/70 | HR 76 | Ht 67.0 in | Wt 230.0 lb

## 2020-03-11 DIAGNOSIS — R351 Nocturia: Secondary | ICD-10-CM

## 2020-03-11 LAB — URINALYSIS, COMPLETE
Bilirubin, UA: NEGATIVE
Glucose, UA: NEGATIVE
Ketones, UA: NEGATIVE
Leukocytes,UA: NEGATIVE
Nitrite, UA: NEGATIVE
Protein,UA: NEGATIVE
RBC, UA: NEGATIVE
Specific Gravity, UA: 1.02 (ref 1.005–1.030)
Urobilinogen, Ur: 1 mg/dL (ref 0.2–1.0)
pH, UA: 6.5 (ref 5.0–7.5)

## 2020-03-11 LAB — MICROSCOPIC EXAMINATION
Bacteria, UA: NONE SEEN
RBC: NONE SEEN /hpf (ref 0–2)
WBC, UA: NONE SEEN /hpf (ref 0–5)

## 2020-03-11 LAB — BLADDER SCAN AMB NON-IMAGING: Scan Result: 12

## 2020-03-11 MED ORDER — TROSPIUM CHLORIDE 20 MG PO TABS
ORAL_TABLET | ORAL | 0 refills | Status: DC
Start: 2020-03-11 — End: 2020-11-01

## 2020-03-11 NOTE — Progress Notes (Signed)
03/11/2020 3:06 PM   Jared Baldwin 1945/07/05 [id number]  Referring provider: Lynnea Ferrier, MD [address] [facility] [place],  [place] [address]  Chief Complaint  Patient presents with  . Other    HPI: 75 y.o. male referred for evaluation of nocturia.   Several year history of nocturia x3-4  No bothersome daytime LUTS  Started on tamsulosin without symptom improvement  Denies dysuria, gross hematuria  Denies flank, abdominal or pelvic pain  + Snoring, states no prior eval for sleep apnea   PMH: Past Medical History:  Diagnosis Date  . Arthritis   . GERD (gastroesophageal reflux disease)   . Hyperlipidemia   . Hypertension   . Hypothyroidism     Surgical History: Past Surgical History:  Procedure Laterality Date  . BACK SURGERY    . CATARACT EXTRACTION W/ INTRAOCULAR LENS  IMPLANT, BILATERAL    . CHOLECYSTECTOMY N/A 09/09/2016   Procedure: LAPAROSCOPIC CHOLECYSTECTOMY WITH INTRAOPERATIVE CHOLANGIOGRAM;  Surgeon: Henrene Dodge, MD;  Location: [facility] ORS;  Service: General;  Laterality: N/A;  . HERNIA REPAIR    . KNEE ARTHROPLASTY Right 07/24/2016   Procedure: COMPUTER ASSISTED TOTAL KNEE ARTHROPLASTY;  Surgeon: Donato Heinz, MD;  Location: [facility] ORS;  Service: Orthopedics;  Laterality: Right;  . TOTAL KNEE ARTHROPLASTY    . UMBILICAL HERNIA REPAIR N/A 09/09/2016   Procedure: HERNIA REPAIR UMBILICAL ADULT;  Surgeon: Henrene Dodge, MD;  Location: [facility] ORS;  Service: General;  Laterality: N/A;    Home Medications:  Allergies as of 03/11/2020      Reactions   Statins Other (See Comments)   Muscle pain with atorvastatin and simvastatin      Medication List       Accurate as of March 11, 2020  3:06 PM. If you have any questions, ask your nurse or doctor.        STOP taking these medications   ketoconazole 2 % cream Commonly known as: NIZORAL Stopped by: Riki Altes, MD     TAKE these medications   Cyanocobalamin 5000 MCG/ML  Liqd Take 5,000 mcg by mouth daily.   gabapentin 100 MG capsule Commonly known as: NEURONTIN Take by mouth.   hydrocortisone 2.5 % cream Apply topically 2 (two) times daily as needed (Rash). Use Tuesday, Thursday, Saturday   levothyroxine 88 MCG tablet Commonly known as: SYNTHROID Take 88 mcg by mouth daily before breakfast.   losartan 25 MG tablet Commonly known as: COZAAR Take 25 mg by mouth daily.   omeprazole 40 MG capsule Commonly known as: PRILOSEC Take by mouth.   pantoprazole 40 MG tablet Commonly known as: PROTONIX Take by mouth.   pravastatin 80 MG tablet Commonly known as: PRAVACHOL Take 80 mg by mouth daily.   tamsulosin 0.4 MG Caps capsule Commonly known as: FLOMAX Take 0.4 mg by mouth.   traZODone 50 MG tablet Commonly known as: DESYREL Take by mouth.       Allergies:  Allergies  Allergen Reactions  . Statins Other (See Comments)    Muscle pain with atorvastatin and simvastatin    Family History: No family history on file.  Social History:  reports that he quit smoking about 22 years ago. His smoking use included cigarettes. He smoked 2.00 packs per day for 0.00 years. He has never used smokeless tobacco. He reports that he does not drink alcohol and does not use drugs.   Physical Exam: BP (!) 149/70   Pulse 76   Ht 5\' 7"  (  1.702 m)   Wt 230 lb (104.3 kg)   BMI 36.02 kg/m   Constitutional:  Alert and oriented, No acute distress. HEENT: [place] AT, moist mucus membranes.  Trachea midline, no masses. Cardiovascular: No clubbing, cyanosis, or edema. Respiratory: Normal respiratory effort, no increased work of breathing. GI: Abdomen is soft, nontender, nondistended, no abdominal masses GU: Prostate small, 25-30 g; no nodules or induration Skin: No rashes, bruises or suspicious lesions. Neurologic: Grossly intact, no focal deficits, moving all 4 extremities. Psychiatric: Normal mood and affect.   Assessment & Plan:    1.  Nocturia  We  discussed the multiple etiologies of nocturia and that this symptom can be difficult to treat  No significant improvement on alpha-blocker  Will give a trial of trospium 20 mg 1 hour prior to bedtime  Instructed call back in 1 month regarding efficacy  If no significant improvement would recommend sleep study via Dr. Odessa Fleming office to assess for sleep apnea which is a common cause of nocturia  Bladder scan PVR was 12 mL urinalysis was unremarkable   Riki Altes, MD  [facility] [address] [place], [place] [address] [phone]

## 2020-03-17 DIAGNOSIS — E538 Deficiency of other specified B group vitamins: Secondary | ICD-10-CM | POA: Diagnosis not present

## 2020-03-17 DIAGNOSIS — I10 Essential (primary) hypertension: Secondary | ICD-10-CM | POA: Diagnosis not present

## 2020-03-24 DIAGNOSIS — E538 Deficiency of other specified B group vitamins: Secondary | ICD-10-CM | POA: Diagnosis not present

## 2020-03-24 DIAGNOSIS — G8929 Other chronic pain: Secondary | ICD-10-CM | POA: Diagnosis not present

## 2020-03-24 DIAGNOSIS — I10 Essential (primary) hypertension: Secondary | ICD-10-CM | POA: Diagnosis not present

## 2020-03-24 DIAGNOSIS — M48061 Spinal stenosis, lumbar region without neurogenic claudication: Secondary | ICD-10-CM | POA: Diagnosis not present

## 2020-03-24 DIAGNOSIS — I700 Atherosclerosis of aorta: Secondary | ICD-10-CM | POA: Diagnosis not present

## 2020-03-24 DIAGNOSIS — E785 Hyperlipidemia, unspecified: Secondary | ICD-10-CM | POA: Diagnosis not present

## 2020-03-24 DIAGNOSIS — M545 Low back pain, unspecified: Secondary | ICD-10-CM | POA: Diagnosis not present

## 2020-03-24 DIAGNOSIS — F3340 Major depressive disorder, recurrent, in remission, unspecified: Secondary | ICD-10-CM | POA: Diagnosis not present

## 2020-03-24 DIAGNOSIS — E039 Hypothyroidism, unspecified: Secondary | ICD-10-CM | POA: Diagnosis not present

## 2020-03-24 DIAGNOSIS — K219 Gastro-esophageal reflux disease without esophagitis: Secondary | ICD-10-CM | POA: Diagnosis not present

## 2020-04-28 DIAGNOSIS — M19011 Primary osteoarthritis, right shoulder: Secondary | ICD-10-CM | POA: Diagnosis not present

## 2020-04-28 DIAGNOSIS — M75101 Unspecified rotator cuff tear or rupture of right shoulder, not specified as traumatic: Secondary | ICD-10-CM | POA: Diagnosis not present

## 2020-04-28 DIAGNOSIS — M75102 Unspecified rotator cuff tear or rupture of left shoulder, not specified as traumatic: Secondary | ICD-10-CM | POA: Diagnosis not present

## 2020-04-28 DIAGNOSIS — M19012 Primary osteoarthritis, left shoulder: Secondary | ICD-10-CM | POA: Diagnosis not present

## 2020-06-16 DIAGNOSIS — E785 Hyperlipidemia, unspecified: Secondary | ICD-10-CM | POA: Diagnosis not present

## 2020-06-16 DIAGNOSIS — E039 Hypothyroidism, unspecified: Secondary | ICD-10-CM | POA: Diagnosis not present

## 2020-06-16 DIAGNOSIS — I10 Essential (primary) hypertension: Secondary | ICD-10-CM | POA: Diagnosis not present

## 2020-06-16 DIAGNOSIS — E538 Deficiency of other specified B group vitamins: Secondary | ICD-10-CM | POA: Diagnosis not present

## 2020-06-23 DIAGNOSIS — I10 Essential (primary) hypertension: Secondary | ICD-10-CM | POA: Diagnosis not present

## 2020-06-23 DIAGNOSIS — K219 Gastro-esophageal reflux disease without esophagitis: Secondary | ICD-10-CM | POA: Diagnosis not present

## 2020-06-23 DIAGNOSIS — F3340 Major depressive disorder, recurrent, in remission, unspecified: Secondary | ICD-10-CM | POA: Diagnosis not present

## 2020-06-23 DIAGNOSIS — E785 Hyperlipidemia, unspecified: Secondary | ICD-10-CM | POA: Diagnosis not present

## 2020-06-23 DIAGNOSIS — E039 Hypothyroidism, unspecified: Secondary | ICD-10-CM | POA: Diagnosis not present

## 2020-06-23 DIAGNOSIS — M48061 Spinal stenosis, lumbar region without neurogenic claudication: Secondary | ICD-10-CM | POA: Diagnosis not present

## 2020-06-23 DIAGNOSIS — I700 Atherosclerosis of aorta: Secondary | ICD-10-CM | POA: Diagnosis not present

## 2020-06-23 DIAGNOSIS — E538 Deficiency of other specified B group vitamins: Secondary | ICD-10-CM | POA: Diagnosis not present

## 2020-06-30 DIAGNOSIS — I10 Essential (primary) hypertension: Secondary | ICD-10-CM | POA: Diagnosis not present

## 2020-07-29 DIAGNOSIS — M25511 Pain in right shoulder: Secondary | ICD-10-CM | POA: Diagnosis not present

## 2020-07-29 DIAGNOSIS — M75101 Unspecified rotator cuff tear or rupture of right shoulder, not specified as traumatic: Secondary | ICD-10-CM | POA: Diagnosis not present

## 2020-07-29 DIAGNOSIS — M19011 Primary osteoarthritis, right shoulder: Secondary | ICD-10-CM | POA: Diagnosis not present

## 2020-07-29 DIAGNOSIS — M25512 Pain in left shoulder: Secondary | ICD-10-CM | POA: Diagnosis not present

## 2020-07-29 DIAGNOSIS — G8929 Other chronic pain: Secondary | ICD-10-CM | POA: Diagnosis not present

## 2020-07-29 DIAGNOSIS — M75102 Unspecified rotator cuff tear or rupture of left shoulder, not specified as traumatic: Secondary | ICD-10-CM | POA: Diagnosis not present

## 2020-07-29 DIAGNOSIS — M19012 Primary osteoarthritis, left shoulder: Secondary | ICD-10-CM | POA: Diagnosis not present

## 2020-09-13 DIAGNOSIS — H35373 Puckering of macula, bilateral: Secondary | ICD-10-CM | POA: Diagnosis not present

## 2020-09-13 DIAGNOSIS — H31001 Unspecified chorioretinal scars, right eye: Secondary | ICD-10-CM | POA: Diagnosis not present

## 2020-10-13 DIAGNOSIS — I10 Essential (primary) hypertension: Secondary | ICD-10-CM | POA: Diagnosis not present

## 2020-10-13 DIAGNOSIS — E538 Deficiency of other specified B group vitamins: Secondary | ICD-10-CM | POA: Diagnosis not present

## 2020-10-13 DIAGNOSIS — E785 Hyperlipidemia, unspecified: Secondary | ICD-10-CM | POA: Diagnosis not present

## 2020-10-13 DIAGNOSIS — E039 Hypothyroidism, unspecified: Secondary | ICD-10-CM | POA: Diagnosis not present

## 2020-10-20 DIAGNOSIS — E538 Deficiency of other specified B group vitamins: Secondary | ICD-10-CM | POA: Diagnosis not present

## 2020-10-20 DIAGNOSIS — F3340 Major depressive disorder, recurrent, in remission, unspecified: Secondary | ICD-10-CM | POA: Diagnosis not present

## 2020-10-20 DIAGNOSIS — I700 Atherosclerosis of aorta: Secondary | ICD-10-CM | POA: Diagnosis not present

## 2020-10-20 DIAGNOSIS — G4733 Obstructive sleep apnea (adult) (pediatric): Secondary | ICD-10-CM | POA: Diagnosis not present

## 2020-10-20 DIAGNOSIS — M48061 Spinal stenosis, lumbar region without neurogenic claudication: Secondary | ICD-10-CM | POA: Diagnosis not present

## 2020-10-20 DIAGNOSIS — I10 Essential (primary) hypertension: Secondary | ICD-10-CM | POA: Diagnosis not present

## 2020-10-20 DIAGNOSIS — E039 Hypothyroidism, unspecified: Secondary | ICD-10-CM | POA: Diagnosis not present

## 2020-10-20 DIAGNOSIS — E785 Hyperlipidemia, unspecified: Secondary | ICD-10-CM | POA: Diagnosis not present

## 2020-10-20 DIAGNOSIS — K219 Gastro-esophageal reflux disease without esophagitis: Secondary | ICD-10-CM | POA: Diagnosis not present

## 2020-10-26 ENCOUNTER — Other Ambulatory Visit: Payer: Self-pay

## 2020-10-26 ENCOUNTER — Encounter: Payer: Self-pay | Admitting: Cardiovascular Disease

## 2020-10-26 ENCOUNTER — Ambulatory Visit: Payer: PPO | Admitting: Cardiovascular Disease

## 2020-10-26 VITALS — BP 134/64 | HR 81 | Ht 67.0 in | Wt 236.1 lb

## 2020-10-26 DIAGNOSIS — I700 Atherosclerosis of aorta: Secondary | ICD-10-CM | POA: Diagnosis not present

## 2020-10-26 DIAGNOSIS — I10 Essential (primary) hypertension: Secondary | ICD-10-CM | POA: Diagnosis not present

## 2020-10-26 DIAGNOSIS — I200 Unstable angina: Secondary | ICD-10-CM

## 2020-10-26 DIAGNOSIS — I350 Nonrheumatic aortic (valve) stenosis: Secondary | ICD-10-CM

## 2020-10-26 DIAGNOSIS — E785 Hyperlipidemia, unspecified: Secondary | ICD-10-CM | POA: Diagnosis not present

## 2020-10-26 NOTE — H&P (View-Only) (Signed)
Cardiology Office Note   Date:  10/26/2020   ID:  Jared Baldwin, Jared Baldwin [birth year], MRN [id number]  PCP:  Lynnea Ferrier, MD  Cardiologist:   Lorine Bears, MD   Chief Complaint  Patient presents with   Other    Chest/body pain, fatigue, solid abd and weakness. Meds reviewed verbally with pt.      History of Present Illness: Jared Baldwin is a 75 y.o. male who self-referred for evaluation of chest pain and shortness of breath.  He is my previous neighbor and was seen by me in 2014 for atypical chest pain and shortness of breath.  He underwent a Lexiscan Myoview which showed no evidence of ischemia with normal ejection fraction.  Echocardiogram at that time showed mild aortic stenosis and no other significant abnormalities. He has chronic medical conditions including obesity status post gastric sleeve surgery, chronic back , GERD, hyperlipidemia, essential hypertension and hypothyroidism. He started having substernal chest pain and discomfort on Saturday radiating to his back.  The the pain lasted for 15 to 30 minutes and has been intermittent since then.  He gets tired very easily.  Symptoms improved with rest.    Past Medical History:  Diagnosis Date   Arthritis    GERD (gastroesophageal reflux disease)    Hyperlipidemia    Hypertension    Hypothyroidism     Past Surgical History:  Procedure Laterality Date   BACK SURGERY     CATARACT EXTRACTION W/ INTRAOCULAR LENS  IMPLANT, BILATERAL     CHOLECYSTECTOMY N/A 09/09/2016   Procedure: LAPAROSCOPIC CHOLECYSTECTOMY WITH INTRAOPERATIVE CHOLANGIOGRAM;  Surgeon: Henrene Dodge, MD;  Location: [facility] ORS;  Service: General;  Laterality: N/A;   HERNIA REPAIR     KNEE ARTHROPLASTY Right 07/24/2016   Procedure: COMPUTER ASSISTED TOTAL KNEE ARTHROPLASTY;  Surgeon: Donato Heinz, MD;  Location: [facility] ORS;  Service: Orthopedics;  Laterality: Right;   TOTAL KNEE ARTHROPLASTY     UMBILICAL HERNIA REPAIR N/A 09/09/2016   Procedure: HERNIA  REPAIR UMBILICAL ADULT;  Surgeon: Henrene Dodge, MD;  Location: [facility] ORS;  Service: General;  Laterality: N/A;     Current Outpatient Medications  Medication Sig Dispense Refill   chlorthalidone (HYGROTON) 25 MG tablet Take 25 mg by mouth daily.     Cyanocobalamin 5000 MCG/ML LIQD Take 5,000 mcg by mouth daily.     escitalopram (LEXAPRO) 5 MG tablet Take 5 mg by mouth daily.     gabapentin (NEURONTIN) 100 MG capsule Take by mouth.     gabapentin (NEURONTIN) 300 MG capsule Take 300 mg by mouth 3 (three) times daily.     levothyroxine (SYNTHROID, LEVOTHROID) 88 MCG tablet Take 88 mcg by mouth daily before breakfast.     losartan (COZAAR) 25 MG tablet Take 25 mg by mouth daily.     omeprazole (PRILOSEC) 40 MG capsule Take by mouth.     potassium chloride (KLOR-CON) 10 MEQ tablet Take 10 mEq by mouth daily.     pravastatin (PRAVACHOL) 80 MG tablet Take 80 mg by mouth daily.     tamsulosin (FLOMAX) 0.4 MG CAPS capsule Take 0.4 mg by mouth.     traZODone (DESYREL) 50 MG tablet Take by mouth.     trospium (SANCTURA) 20 MG tablet 1 tab 1 hour prior to bedtime 30 tablet 0   No current facility-administered medications for this visit.    Allergies:   Statins    Social History:  The patient  reports that he quit smoking about 23  years ago. His smoking use included cigarettes. He smoked an average of 2.00 packs per day. He has never used smokeless tobacco. He reports that he does not drink alcohol and does not use drugs.   Family History:  The patient's family history is negative for coronary artery disease.   ROS:  Please see the history of present illness.   Otherwise, review of systems are positive for none.   All other systems are reviewed and negative.    PHYSICAL EXAM: VS:  BP 134/64 (BP Location: Right Arm, Patient Position: Sitting, Cuff Size: Normal)   Pulse 81   Ht 5\' 7"  (1.702 m)   Wt 236 lb 2 oz (107.1 kg)   SpO2 93%   BMI 36.98 kg/m  , BMI Body mass index is 36.98  kg/m. GEN: Well nourished, well developed, in no acute distress  HEENT: normal  Neck: no JVD, carotid bruits, or masses Cardiac: RRR; no  rubs, or gallops,no edema .  3 out of 6 crescendo decrescendo systolic murmur in the aortic area which is late peaking.  S2 is diminished. Respiratory:  clear to auscultation bilaterally, normal work of breathing GI: soft, nontender, nondistended, + BS MS: no deformity or atrophy  Skin: warm and dry, no rash Neuro:  Strength and sensation are intact Psych: euthymic mood, full affect   EKG:  EKG is ordered today. The ekg ordered today demonstrates normal sinus rhythm with possible old inferior infarct.   Recent Labs: No results found for requested labs within last 8760 hours.    Lipid Panel No results found for: CHOL, TRIG, HDL, CHOLHDL, VLDL, LDLCALC, LDLDIRECT    Wt Readings from Last 3 Encounters:  10/26/20 236 lb 2 oz (107.1 kg)  03/11/20 230 lb (104.3 kg)  09/27/16 195 lb 9.6 oz (88.7 kg)       PAD Screen 10/26/2020  Previous PAD dx? No  Previous surgical procedure? No  Pain with walking? No  Feet/toe relief with dangling? No  Painful, non-healing ulcers? No  Extremities discolored? No      ASSESSMENT AND PLAN:  1.  Unstable angina: The patient's symptoms are worrisome for unstable angina.  EKG suggestive of prior inferior infarct but he had this pattern on previous EKG.  His physical exam is suggestive of significant aortic stenosis which is likely contributing.  I asked him to start taking aspirin 81 mg once daily.  I recommend proceeding with a right and left cardiac catheterization possible PCI.  I discussed the procedure in details as well as risk and benefits.  Recommend obtaining an echocardiogram before cardiac catheterization.  2.  Essential hypertension: Blood pressure is controlled on losartan.  He does have underlying chronic kidney disease with most recent creatinine of 1.5.  3.  Hyperlipidemia: Currently on  pravastatin 80 mg daily.  There is history of myalgia with atorvastatin and simvastatin.  If he has significant underlying coronary artery disease, will consider switching him to rosuvastatin.    Disposition: Schedule an echocardiogram, right and left cardiac catheterization and FU with me in 1 month  Signed,  10/28/2020, MD  10/26/2020 3:06 PM    [facility]

## 2020-10-26 NOTE — Patient Instructions (Addendum)
Medication Instructions:   Your physician has recommended you make the following change in your medication:    START taking Aspirin 81 MG once a day.   *If you need a refill on your cardiac medications before your next appointment, please call your pharmacy*   Lab Work:   CBC, and a BMP drawn in office today.  If you have labs (blood work) drawn today and your tests are completely normal, you will receive your results only by: MyChart Message (if you have MyChart) OR A paper copy in the mail If you have any lab test that is abnormal or we need to change your treatment, we will call you to review the results.   Testing/Procedures:   Your physician has requested that you have an echocardiogram. Echocardiography is a painless test that uses sound waves to create images of your heart. It provides your doctor with information about the size and shape of your heart and how well your heart's chambers and valves are working. This procedure takes approximately one hour. There are no restrictions for this procedure.  2.    You are scheduled for a Cardiac Catheterization on Friday, November 4 with Dr. Lorine Bears.  1. [facility] at 8:30 AM (This time is one hour before your procedure to ensure your preparation). Free valet parking service is available.   Special note: Every effort is made to have your procedure done on time. Please understand that emergencies sometimes delay scheduled procedures.  2. Diet: Do not eat solid foods after midnight.  The patient may have clear liquids until 5am upon the day of the procedure.  3. Labs: Drawn in office today.  4. Medication instructions in preparation for your procedure:   Contrast Allergy: No   On the morning of your procedure, take your Aspirin and any morning medicines NOT listed above.  You may use sips of water.  5. Plan for one night stay--bring personal belongings. 6. Bring a current list of your medications  and current insurance cards. 7. You MUST have a responsible person to drive you home. 8. Someone MUST be with you the first 24 hours after you arrive home or your discharge will be delayed. 9. Please wear clothes that are easy to get on and off and wear slip-on shoes.  Thank you for allowing [place] to care for you!   -- [facility]    Follow-Up: At [facility], you and your health needs are our priority.  As part of our continuing mission to provide you with exceptional heart care, we have created designated Provider Care Teams.  These Care Teams include your primary Cardiologist (physician) and Advanced Practice Providers (APPs -  Physician Assistants and Nurse Practitioners) who all work together to provide you with the care you need, when you need it.  We recommend signing up for the patient portal called "MyChart".  Sign up information is provided on this After Visit Summary.  MyChart is used to connect with patients for Virtual Visits (Telemedicine).  Patients are able to view lab/test results, encounter notes, upcoming appointments, etc.  Non-urgent messages can be sent to your provider as well.   To learn more about what you can do with MyChart, go to [email or web address].    Your next appointment:   1 month(s)  The format for your next appointment:   In Person  Provider:   You may see Lorine Bears or one of the following Advanced Practice Providers on  your designated Care Team:   Nicolasa Ducking, NP Eula Listen, PA-C Marisue Ivan, PA-C Cadence Fransico Michael, [place]   Other Instructions

## 2020-10-26 NOTE — Progress Notes (Signed)
Cardiology Office Note   Date:  10/26/2020   ID:  Jared, Baldwin [birth year], MRN [id number]  PCP:  Jared Ferrier, MD  Cardiologist:   Jared Bears, MD   Chief Complaint  Patient presents with   Other    Chest/body pain, fatigue, solid abd and weakness. Meds reviewed verbally with pt.      History of Present Illness: Jared Baldwin is a 75 y.o. male who self-referred for evaluation of chest pain and shortness of breath.  He is my previous neighbor and was seen by me in 2014 for atypical chest pain and shortness of breath.  He underwent a Lexiscan Myoview which showed no evidence of ischemia with normal ejection fraction.  Echocardiogram at that time showed mild aortic stenosis and no other significant abnormalities. He has chronic medical conditions including obesity status post gastric sleeve surgery, chronic back , GERD, hyperlipidemia, essential hypertension and hypothyroidism. He started having substernal chest pain and discomfort on Saturday radiating to his back.  The the pain lasted for 15 to 30 minutes and has been intermittent since then.  He gets tired very easily.  Symptoms improved with rest.    Past Medical History:  Diagnosis Date   Arthritis    GERD (gastroesophageal reflux disease)    Hyperlipidemia    Hypertension    Hypothyroidism     Past Surgical History:  Procedure Laterality Date   BACK SURGERY     CATARACT EXTRACTION W/ INTRAOCULAR LENS  IMPLANT, BILATERAL     CHOLECYSTECTOMY N/A 09/09/2016   Procedure: LAPAROSCOPIC CHOLECYSTECTOMY WITH INTRAOPERATIVE CHOLANGIOGRAM;  Surgeon: Henrene Dodge, MD;  Location: [facility] ORS;  Service: General;  Laterality: N/A;   HERNIA REPAIR     KNEE ARTHROPLASTY Right 07/24/2016   Procedure: COMPUTER ASSISTED TOTAL KNEE ARTHROPLASTY;  Surgeon: Donato Heinz, MD;  Location: [facility] ORS;  Service: Orthopedics;  Laterality: Right;   TOTAL KNEE ARTHROPLASTY     UMBILICAL HERNIA REPAIR N/A 09/09/2016   Procedure: HERNIA  REPAIR UMBILICAL ADULT;  Surgeon: Henrene Dodge, MD;  Location: [facility] ORS;  Service: General;  Laterality: N/A;     Current Outpatient Medications  Medication Sig Dispense Refill   chlorthalidone (HYGROTON) 25 MG tablet Take 25 mg by mouth daily.     Cyanocobalamin 5000 MCG/ML LIQD Take 5,000 mcg by mouth daily.     escitalopram (LEXAPRO) 5 MG tablet Take 5 mg by mouth daily.     gabapentin (NEURONTIN) 100 MG capsule Take by mouth.     gabapentin (NEURONTIN) 300 MG capsule Take 300 mg by mouth 3 (three) times daily.     levothyroxine (SYNTHROID, LEVOTHROID) 88 MCG tablet Take 88 mcg by mouth daily before breakfast.     losartan (COZAAR) 25 MG tablet Take 25 mg by mouth daily.     omeprazole (PRILOSEC) 40 MG capsule Take by mouth.     potassium chloride (KLOR-CON) 10 MEQ tablet Take 10 mEq by mouth daily.     pravastatin (PRAVACHOL) 80 MG tablet Take 80 mg by mouth daily.     tamsulosin (FLOMAX) 0.4 MG CAPS capsule Take 0.4 mg by mouth.     traZODone (DESYREL) 50 MG tablet Take by mouth.     trospium (SANCTURA) 20 MG tablet 1 tab 1 hour prior to bedtime 30 tablet 0   No current facility-administered medications for this visit.    Allergies:   Statins    Social History:  The patient  reports that he quit smoking about 23  years ago. His smoking use included cigarettes. He smoked an average of 2.00 packs per day. He has never used smokeless tobacco. He reports that he does not drink alcohol and does not use drugs.   Family History:  The patient's family history is negative for coronary artery disease.   ROS:  Please see the history of present illness.   Otherwise, review of systems are positive for none.   All other systems are reviewed and negative.    PHYSICAL EXAM: VS:  BP 134/64 (BP Location: Right Arm, Patient Position: Sitting, Cuff Size: Normal)   Pulse 81   Ht 5\' 7"  (1.702 m)   Wt 236 lb 2 oz (107.1 kg)   SpO2 93%   BMI 36.98 kg/m  , BMI Body mass index is 36.98  kg/m. GEN: Well nourished, well developed, in no acute distress  HEENT: normal  Neck: no JVD, carotid bruits, or masses Cardiac: RRR; no  rubs, or gallops,no edema .  3 out of 6 crescendo decrescendo systolic murmur in the aortic area which is late peaking.  S2 is diminished. Respiratory:  clear to auscultation bilaterally, normal work of breathing GI: soft, nontender, nondistended, + BS MS: no deformity or atrophy  Skin: warm and dry, no rash Neuro:  Strength and sensation are intact Psych: euthymic mood, full affect   EKG:  EKG is ordered today. The ekg ordered today demonstrates normal sinus rhythm with possible old inferior infarct.   Recent Labs: No results found for requested labs within last 8760 hours.    Lipid Panel No results found for: CHOL, TRIG, HDL, CHOLHDL, VLDL, LDLCALC, LDLDIRECT    Wt Readings from Last 3 Encounters:  10/26/20 236 lb 2 oz (107.1 kg)  03/11/20 230 lb (104.3 kg)  09/27/16 195 lb 9.6 oz (88.7 kg)       PAD Screen 10/26/2020  Previous PAD dx? No  Previous surgical procedure? No  Pain with walking? No  Feet/toe relief with dangling? No  Painful, non-healing ulcers? No  Extremities discolored? No      ASSESSMENT AND PLAN:  1.  Unstable angina: The patient's symptoms are worrisome for unstable angina.  EKG suggestive of prior inferior infarct but he had this pattern on previous EKG.  His physical exam is suggestive of significant aortic stenosis which is likely contributing.  I asked him to start taking aspirin 81 mg once daily.  I recommend proceeding with a right and left cardiac catheterization possible PCI.  I discussed the procedure in details as well as risk and benefits.  Recommend obtaining an echocardiogram before cardiac catheterization.  2.  Essential hypertension: Blood pressure is controlled on losartan.  He does have underlying chronic kidney disease with most recent creatinine of 1.5.  3.  Hyperlipidemia: Currently on  pravastatin 80 mg daily.  There is history of myalgia with atorvastatin and simvastatin.  If he has significant underlying coronary artery disease, will consider switching him to rosuvastatin.    Disposition: Schedule an echocardiogram, right and left cardiac catheterization and FU with me in 1 month  Signed,  10/28/2020, MD  10/26/2020 3:06 PM    [facility]

## 2020-10-27 LAB — CBC
Hematocrit: 40 % (ref 37.5–51.0)
Hemoglobin: 13.5 g/dL (ref 13.0–17.7)
MCH: 30.3 pg (ref 26.6–33.0)
MCHC: 33.8 g/dL (ref 31.5–35.7)
MCV: 90 fL (ref 79–97)
Platelets: 235 10*3/uL (ref 150–450)
RBC: 4.46 x10E6/uL (ref 4.14–5.80)
RDW: 13 % (ref 11.6–15.4)
WBC: 8.5 10*3/uL (ref 3.4–10.8)

## 2020-10-27 LAB — BASIC METABOLIC PANEL
BUN/Creatinine Ratio: 17 (ref 10–24)
BUN: 27 mg/dL (ref 8–27)
CO2: 23 mmol/L (ref 20–29)
Calcium: 9.8 mg/dL (ref 8.6–10.2)
Chloride: 99 mmol/L (ref 96–106)
Creatinine, Ser: 1.59 mg/dL — ABNORMAL HIGH (ref 0.76–1.27)
Glucose: 116 mg/dL — ABNORMAL HIGH (ref 70–99)
Potassium: 3.5 mmol/L (ref 3.5–5.2)
Sodium: 139 mmol/L (ref 134–144)
eGFR: 45 mL/min/{1.73_m2} — ABNORMAL LOW (ref >59)

## 2020-10-28 DIAGNOSIS — M75101 Unspecified rotator cuff tear or rupture of right shoulder, not specified as traumatic: Secondary | ICD-10-CM | POA: Diagnosis not present

## 2020-10-28 DIAGNOSIS — M25512 Pain in left shoulder: Secondary | ICD-10-CM | POA: Diagnosis not present

## 2020-10-28 DIAGNOSIS — M75102 Unspecified rotator cuff tear or rupture of left shoulder, not specified as traumatic: Secondary | ICD-10-CM | POA: Diagnosis not present

## 2020-10-28 DIAGNOSIS — M19012 Primary osteoarthritis, left shoulder: Secondary | ICD-10-CM | POA: Diagnosis not present

## 2020-10-28 DIAGNOSIS — M25511 Pain in right shoulder: Secondary | ICD-10-CM | POA: Diagnosis not present

## 2020-10-28 DIAGNOSIS — G8929 Other chronic pain: Secondary | ICD-10-CM | POA: Diagnosis not present

## 2020-10-28 DIAGNOSIS — M19011 Primary osteoarthritis, right shoulder: Secondary | ICD-10-CM | POA: Diagnosis not present

## 2020-10-29 ENCOUNTER — Telehealth: Payer: Self-pay

## 2020-10-29 NOTE — Telephone Encounter (Signed)
Attempted to call pt and wife cell, unable to reach pt successfully. LMTCB  Called cath lab and arrange for pt to arrive at 06:00 am 11/4 for hydration at 06:30 am, prior to schedule heart cath 11/4 at 09:30 am. Pt needs to be informed once he calls back.

## 2020-11-03 ENCOUNTER — Other Ambulatory Visit: Payer: Self-pay

## 2020-11-03 ENCOUNTER — Ambulatory Visit (HOSPITAL_COMMUNITY): Payer: PPO | Attending: Cardiology

## 2020-11-03 DIAGNOSIS — I350 Nonrheumatic aortic (valve) stenosis: Secondary | ICD-10-CM | POA: Diagnosis not present

## 2020-11-03 LAB — ECHOCARDIOGRAM COMPLETE
AR max vel: 1.69 cm2
AV Area VTI: 1.72 cm2
AV Area mean vel: 1.61 cm2
AV Mean grad: 12 mmHg
AV Peak grad: 20.3 mmHg
Ao pk vel: 2.26 m/s
Area-P 1/2: 3.01 cm2
P 1/2 time: 554 msec
S' Lateral: 2.8 cm

## 2020-11-04 ENCOUNTER — Telehealth: Payer: Self-pay

## 2020-11-04 NOTE — Telephone Encounter (Signed)
Received a secure chat from Dr. Kirke Corin requesting to have patients cath lab procedure moved up from 0930 to 0730 tomorrow. Called scheduling and had it changed and notified patients wife. She confirmed that they will be here at 0630. There were previous notes in the chart that the patient would need pre-hydration prior to cath, Dr. Kirke Corin cancelled that and stated that he would use less contrast dye. Informed nursing in specials of this change.

## 2020-11-05 ENCOUNTER — Ambulatory Visit
Admission: RE | Admit: 2020-11-05 | Discharge: 2020-11-05 | Disposition: A | Discharge status: Home / Self Care | Payer: PPO | Attending: Cardiovascular Disease | Admitting: Cardiovascular Disease

## 2020-11-05 ENCOUNTER — Encounter: Payer: Self-pay | Admitting: Cardiovascular Disease

## 2020-11-05 ENCOUNTER — Ambulatory Visit: Payer: PPO | Admitting: Cardiology

## 2020-11-05 ENCOUNTER — Other Ambulatory Visit: Payer: Self-pay

## 2020-11-05 ENCOUNTER — Encounter
Admission: RE | Disposition: A | Discharge status: Home / Self Care | Payer: Self-pay | Source: Home / Self Care | Attending: Cardiovascular Disease

## 2020-11-05 DIAGNOSIS — I272 Pulmonary hypertension, unspecified: Secondary | ICD-10-CM | POA: Diagnosis not present

## 2020-11-05 DIAGNOSIS — I200 Unstable angina: Secondary | ICD-10-CM

## 2020-11-05 DIAGNOSIS — K219 Gastro-esophageal reflux disease without esophagitis: Secondary | ICD-10-CM | POA: Insufficient documentation

## 2020-11-05 DIAGNOSIS — E039 Hypothyroidism, unspecified: Secondary | ICD-10-CM | POA: Diagnosis not present

## 2020-11-05 DIAGNOSIS — N189 Chronic kidney disease, unspecified: Secondary | ICD-10-CM | POA: Diagnosis not present

## 2020-11-05 DIAGNOSIS — F17210 Nicotine dependence, cigarettes, uncomplicated: Secondary | ICD-10-CM | POA: Insufficient documentation

## 2020-11-05 DIAGNOSIS — I25110 Atherosclerotic heart disease of native coronary artery with unstable angina pectoris: Secondary | ICD-10-CM | POA: Insufficient documentation

## 2020-11-05 DIAGNOSIS — Z79890 Hormone replacement therapy (postmenopausal): Secondary | ICD-10-CM | POA: Insufficient documentation

## 2020-11-05 DIAGNOSIS — Z79899 Other long term (current) drug therapy: Secondary | ICD-10-CM | POA: Insufficient documentation

## 2020-11-05 DIAGNOSIS — I129 Hypertensive chronic kidney disease with stage 1 through stage 4 chronic kidney disease, or unspecified chronic kidney disease: Secondary | ICD-10-CM | POA: Insufficient documentation

## 2020-11-05 DIAGNOSIS — E785 Hyperlipidemia, unspecified: Secondary | ICD-10-CM | POA: Diagnosis not present

## 2020-11-05 DIAGNOSIS — Z888 Allergy status to other drugs, medicaments and biological substances status: Secondary | ICD-10-CM | POA: Insufficient documentation

## 2020-11-05 DIAGNOSIS — I350 Nonrheumatic aortic (valve) stenosis: Secondary | ICD-10-CM

## 2020-11-05 HISTORY — PX: RIGHT/LEFT HEART CATH AND CORONARY ANGIOGRAPHY: CATH118266

## 2020-11-05 HISTORY — PX: CORONARY PRESSURE/FFR WITH 3D MAPPING: CATH118309

## 2020-11-05 SURGERY — RIGHT/LEFT HEART CATH AND CORONARY ANGIOGRAPHY
Anesthesia: Moderate Sedation

## 2020-11-05 MED ORDER — FENTANYL CITRATE (PF) 100 MCG/2ML IJ SOLN
INTRAMUSCULAR | Status: DC | PRN
  Administered 2020-11-05 (×2): 25 ug via INTRAVENOUS

## 2020-11-05 MED ORDER — ONDANSETRON HCL 4 MG/2ML IJ SOLN: 4.0000 mg | Freq: Four times a day (QID) | INTRAMUSCULAR | Status: DC | PRN

## 2020-11-05 MED ORDER — SODIUM CHLORIDE 0.9% FLUSH: 3.0000 mL | Freq: Two times a day (BID) | INTRAVENOUS | Status: DC

## 2020-11-05 MED ORDER — FENTANYL CITRATE (PF) 100 MCG/2ML IJ SOLN
INTRAMUSCULAR | Status: AC
  Filled 2020-11-05: qty 2

## 2020-11-05 MED ORDER — MIDAZOLAM HCL 2 MG/2ML IJ SOLN
INTRAMUSCULAR | Status: AC
  Filled 2020-11-05: qty 2

## 2020-11-05 MED ORDER — LIDOCAINE HCL 1 % IJ SOLN
INTRAMUSCULAR | Status: AC
  Filled 2020-11-05: qty 20

## 2020-11-05 MED ORDER — LIDOCAINE HCL (PF) 1 % IJ SOLN
INTRAMUSCULAR | Status: DC | PRN
  Administered 2020-11-05: 4 mL

## 2020-11-05 MED ORDER — HEPARIN SODIUM (PORCINE) 1000 UNIT/ML IJ SOLN
INTRAMUSCULAR | Status: AC
  Filled 2020-11-05: qty 1

## 2020-11-05 MED ORDER — HEPARIN SODIUM (PORCINE) 1000 UNIT/ML IJ SOLN
INTRAMUSCULAR | Status: DC | PRN
  Administered 2020-11-05 (×2): 5000 [IU] via INTRAVENOUS

## 2020-11-05 MED ORDER — HEPARIN (PORCINE) IN NACL 1000-0.9 UT/500ML-% IV SOLN
INTRAVENOUS | Status: AC
  Filled 2020-11-05: qty 1000

## 2020-11-05 MED ORDER — SODIUM CHLORIDE 0.9 % IV SOLN: INTRAVENOUS | Status: DC

## 2020-11-05 MED ORDER — ASPIRIN 81 MG PO CHEW
CHEWABLE_TABLET | ORAL | Status: AC
  Filled 2020-11-05: qty 1

## 2020-11-05 MED ORDER — MIDAZOLAM HCL 2 MG/2ML IJ SOLN
INTRAMUSCULAR | Status: DC | PRN
  Administered 2020-11-05 (×2): 1 mg via INTRAVENOUS

## 2020-11-05 MED ORDER — SODIUM CHLORIDE 0.9 % IV SOLN: 250.0000 mL | INTRAVENOUS | Status: DC | PRN

## 2020-11-05 MED ORDER — IOHEXOL 350 MG/ML SOLN
INTRAVENOUS | Status: DC | PRN
  Administered 2020-11-05: 60 mL

## 2020-11-05 MED ORDER — ACETAMINOPHEN 325 MG PO TABS: 650.0000 mg | ORAL_TABLET | ORAL | Status: DC | PRN

## 2020-11-05 MED ORDER — ASPIRIN 81 MG PO CHEW
81.0000 mg | CHEWABLE_TABLET | ORAL | Status: AC
  Administered 2020-11-05: 81 mg via ORAL

## 2020-11-05 MED ORDER — SODIUM CHLORIDE 0.9% FLUSH: 3.0000 mL | INTRAVENOUS | Status: DC | PRN

## 2020-11-05 MED ORDER — VERAPAMIL HCL 2.5 MG/ML IV SOLN
INTRAVENOUS | Status: DC | PRN
  Administered 2020-11-05 (×2): 2.5 mg via INTRA_ARTERIAL

## 2020-11-05 MED ORDER — HEPARIN (PORCINE) IN NACL 1000-0.9 UT/500ML-% IV SOLN
INTRAVENOUS | Status: DC | PRN
  Administered 2020-11-05: 1000 mL

## 2020-11-05 MED ORDER — VERAPAMIL HCL 2.5 MG/ML IV SOLN
INTRAVENOUS | Status: AC
  Filled 2020-11-05: qty 2

## 2020-11-05 SURGICAL SUPPLY — 21 items
CATH BALLN WEDGE 5F 110CM (CATHETERS) ×1 IMPLANT
CATH INFINITI 5FR JK (CATHETERS) ×1 IMPLANT
CATH INFINITI JR4 5F (CATHETERS) ×1 IMPLANT
CATH LAUNCHER 5F EBU3.5 (CATHETERS) ×1 IMPLANT
CATH LAUNCHER 6FR EBU3.5 (CATHETERS) ×1 IMPLANT
DEVICE RAD TR BAND REGULAR (VASCULAR PRODUCTS) ×1 IMPLANT
DRAPE BRACHIAL (DRAPES) ×2 IMPLANT
GLIDESHEATH SLEND SS 6F .021 (SHEATH) ×1 IMPLANT
GUIDEWIRE .025 260CM (WIRE) ×1 IMPLANT
GUIDEWIRE INQWIRE 1.5J.035X260 (WIRE) IMPLANT
GUIDEWIRE PRESS OMNI 185 ST (WIRE) ×1 IMPLANT
INQWIRE 1.5J .035X260CM (WIRE) ×3
KIT ENCORE 26 ADVANTAGE (KITS) ×1 IMPLANT
KIT SYRINGE INJ CVI SPIKEX1 (MISCELLANEOUS) ×1 IMPLANT
PACK CARDIAC CATH (CUSTOM PROCEDURE TRAY) ×3 IMPLANT
PROTECTION STATION PRESSURIZED (MISCELLANEOUS) ×3
SET ATX SIMPLICITY (MISCELLANEOUS) ×1 IMPLANT
SHEATH GLIDE SLENDER 4/5FR (SHEATH) ×1 IMPLANT
STATION PROTECTION PRESSURIZED (MISCELLANEOUS) IMPLANT
TUBING CIL FLEX 10 FLL-RA (TUBING) ×1 IMPLANT
WIRE HITORQ VERSACORE ST 145CM (WIRE) ×1 IMPLANT

## 2020-11-05 NOTE — Interval H&P Note (Signed)
Cath Lab Visit (complete for each Cath Lab visit)  Clinical Evaluation Leading to the Procedure:   ACS: No.  Non-ACS:    Anginal Classification: CCS III  Anti-ischemic medical therapy: No Therapy  Non-Invasive Test Results: No non-invasive testing performed  Prior CABG: No previous CABG      History and Physical Interval Note:  11/05/2020 8:09 AM  Jared Baldwin  has presented today for surgery, with the diagnosis of RT and LT Cath    Unstable angina   Per Dr Kirke Corin needs 3 hrs hydration coming in at 630a.  The various methods of treatment have been discussed with the patient and family. After consideration of risks, benefits and other options for treatment, the patient has consented to  Procedure(s): RIGHT/LEFT HEART CATH AND CORONARY ANGIOGRAPHY (Bilateral) as a surgical intervention.  The patient's history has been reviewed, patient examined, no change in status, stable for surgery.  I have reviewed the patient's chart and labs.  Questions were answered to the patient's satisfaction.     Lorine Bears

## 2020-11-08 ENCOUNTER — Telehealth: Payer: Self-pay | Admitting: Cardiovascular Disease

## 2020-11-08 ENCOUNTER — Encounter: Payer: Self-pay | Admitting: Cardiovascular Disease

## 2020-11-08 LAB — POCT ACTIVATED CLOTTING TIME: Activated Clotting Time: 335 seconds

## 2020-11-08 NOTE — Telephone Encounter (Signed)
Patient's wife is calling to make sure it is okay if patient takes the 3rd booster vaccine for Covid. Please call to discuss. She states that patient had a cath last week.

## 2020-11-08 NOTE — Telephone Encounter (Signed)
Will route to Dr. Arida to advise. 

## 2020-11-08 NOTE — Telephone Encounter (Signed)
Yes, that is fine. 

## 2020-11-08 NOTE — Telephone Encounter (Signed)
Pt wife made aware of Dr. Jari Sportsman response and voiced appreciation for the call.

## 2020-11-15 DIAGNOSIS — E039 Hypothyroidism, unspecified: Secondary | ICD-10-CM | POA: Diagnosis not present

## 2020-11-15 DIAGNOSIS — M48061 Spinal stenosis, lumbar region without neurogenic claudication: Secondary | ICD-10-CM | POA: Diagnosis not present

## 2020-11-15 DIAGNOSIS — E538 Deficiency of other specified B group vitamins: Secondary | ICD-10-CM | POA: Diagnosis not present

## 2020-11-15 DIAGNOSIS — I10 Essential (primary) hypertension: Secondary | ICD-10-CM | POA: Diagnosis not present

## 2020-11-15 DIAGNOSIS — I700 Atherosclerosis of aorta: Secondary | ICD-10-CM | POA: Diagnosis not present

## 2020-11-15 DIAGNOSIS — I2510 Atherosclerotic heart disease of native coronary artery without angina pectoris: Secondary | ICD-10-CM | POA: Insufficient documentation

## 2020-11-15 DIAGNOSIS — F3340 Major depressive disorder, recurrent, in remission, unspecified: Secondary | ICD-10-CM | POA: Diagnosis not present

## 2020-11-15 DIAGNOSIS — Z9884 Bariatric surgery status: Secondary | ICD-10-CM | POA: Diagnosis not present

## 2020-11-15 DIAGNOSIS — E785 Hyperlipidemia, unspecified: Secondary | ICD-10-CM | POA: Diagnosis not present

## 2020-11-17 ENCOUNTER — Encounter (INDEPENDENT_AMBULATORY_CARE_PROVIDER_SITE_OTHER): Payer: Self-pay

## 2020-11-17 DIAGNOSIS — H35033 Hypertensive retinopathy, bilateral: Secondary | ICD-10-CM | POA: Diagnosis not present

## 2020-11-17 DIAGNOSIS — H40023 Open angle with borderline findings, high risk, bilateral: Secondary | ICD-10-CM | POA: Diagnosis not present

## 2020-11-22 ENCOUNTER — Ambulatory Visit (INDEPENDENT_AMBULATORY_CARE_PROVIDER_SITE_OTHER): Payer: PPO | Admitting: Ophthalmology

## 2020-11-22 ENCOUNTER — Other Ambulatory Visit: Payer: Self-pay

## 2020-11-22 DIAGNOSIS — Z961 Presence of intraocular lens: Secondary | ICD-10-CM | POA: Diagnosis not present

## 2020-11-22 DIAGNOSIS — H59811 Chorioretinal scars after surgery for detachment, right eye: Secondary | ICD-10-CM

## 2020-11-22 DIAGNOSIS — H43312 Vitreous membranes and strands, left eye: Secondary | ICD-10-CM

## 2020-11-22 DIAGNOSIS — H43812 Vitreous degeneration, left eye: Secondary | ICD-10-CM | POA: Diagnosis not present

## 2020-11-22 DIAGNOSIS — Z8669 Personal history of other diseases of the nervous system and sense organs: Secondary | ICD-10-CM | POA: Diagnosis not present

## 2020-11-22 NOTE — Progress Notes (Signed)
11/22/2020     CHIEF COMPLAINT Patient presents for  Chief Complaint  Patient presents with   Retina Evaluation      HISTORY OF PRESENT ILLNESS: Jared Baldwin is a 75 y.o. male who presents to the clinic today for:   HPI     Retina Evaluation   In both eyes.  This started months ago.  Associated Symptoms Floaters and Blind Spot.        Comments   NP- possible retinal ter OS with PVD- referred by [facility]. Pt wife states "right eye when I look up it is blurry, like I cannot see anything it is black. When we lived in [place] he had a detached retina, and they are saying he might have another one in the left eye." Pt states "sometimes I see a black thing going all around and up and down. It has been going on for a couple months, I have seen different doctors."      Last edited by Nelva Nay on 11/22/2020  3:32 PM.      Referring physician: Kerri Perches, MD [address] [place],  [place] [address]  HISTORICAL INFORMATION:   Selected notes from the MEDICAL RECORD NUMBER       CURRENT MEDICATIONS: No current outpatient medications on file. (Ophthalmic Drugs)   No current facility-administered medications for this visit. (Ophthalmic Drugs)   Current Outpatient Medications (Other)  Medication Sig   aspirin EC 81 MG tablet Take 81 mg by mouth daily. Swallow whole.   chlorthalidone (HYGROTON) 25 MG tablet Take 25 mg by mouth daily.   Cholecalciferol (VITAMIN D) 50 MCG (2000 UT) tablet Take 4,000 Units by mouth daily.   Cyanocobalamin 5000 MCG/ML LIQD Take 5,000 mcg by mouth daily.   escitalopram (LEXAPRO) 5 MG tablet Take 5 mg by mouth daily.   gabapentin (NEURONTIN) 300 MG capsule Take 300 mg by mouth 3 (three) times daily.   levothyroxine (SYNTHROID, LEVOTHROID) 88 MCG tablet Take 88 mcg by mouth daily before breakfast.   losartan (COZAAR) 25 MG tablet Take 25 mg by mouth daily.   omeprazole (PRILOSEC) 40 MG capsule Take 40 mg by mouth daily.    potassium chloride (KLOR-CON) 10 MEQ tablet Take 10 mEq by mouth daily.   pravastatin (PRAVACHOL) 80 MG tablet Take 80 mg by mouth daily.   tamsulosin (FLOMAX) 0.4 MG CAPS capsule Take 0.4 mg by mouth daily.   traZODone (DESYREL) 50 MG tablet Take 50 mg by mouth at bedtime.   TURMERIC CURCUMIN PO Take 800 mg by mouth daily.   No current facility-administered medications for this visit. (Other)      REVIEW OF SYSTEMS:    ALLERGIES Allergies  Allergen Reactions   Statins Other (See Comments)    Muscle pain with atorvastatin and simvastatin    PAST MEDICAL HISTORY Past Medical History:  Diagnosis Date   Arthritis    GERD (gastroesophageal reflux disease)    Hyperlipidemia    Hypertension    Hypothyroidism    Past Surgical History:  Procedure Laterality Date   BACK SURGERY     CATARACT EXTRACTION W/ INTRAOCULAR LENS  IMPLANT, BILATERAL     CHOLECYSTECTOMY N/A 09/09/2016   Procedure: LAPAROSCOPIC CHOLECYSTECTOMY WITH INTRAOPERATIVE CHOLANGIOGRAM;  Surgeon: Henrene Dodge, MD;  Location: [facility] ORS;  Service: General;  Laterality: N/A;   CORONARY PRESSURE WIRE/FFR WITH 3D MAPPING N/A 11/05/2020   Procedure: Coronary Pressure Wire/FFR w/3D Mapping;  Surgeon: Iran Ouch, MD;  Location: [facility]  INVASIVE CV LAB;  Service: Cardiovascular;  Laterality: N/A;   HERNIA REPAIR     KNEE ARTHROPLASTY Right 07/24/2016   Procedure: COMPUTER ASSISTED TOTAL KNEE ARTHROPLASTY;  Surgeon: Donato Heinz, MD;  Location: [facility] ORS;  Service: Orthopedics;  Laterality: Right;   RIGHT/LEFT HEART CATH AND CORONARY ANGIOGRAPHY Bilateral 11/05/2020   Procedure: RIGHT/LEFT HEART CATH AND CORONARY ANGIOGRAPHY;  Surgeon: Iran Ouch, MD;  Location: [facility] INVASIVE CV LAB;  Service: Cardiovascular;  Laterality: Bilateral;   TOTAL KNEE ARTHROPLASTY     UMBILICAL HERNIA REPAIR N/A 09/09/2016   Procedure: HERNIA REPAIR UMBILICAL ADULT;  Surgeon: Henrene Dodge, MD;  Location: [facility] ORS;  Service: General;   Laterality: N/A;    FAMILY HISTORY No family history on file.  SOCIAL HISTORY Social History   Tobacco Use   Smoking status: Former    Packs/day: 2.00    Years: 0.00    Pack years: 0.00    Types: Cigarettes    Quit date: 07/11/1997    Years since quitting: 23.3   Smokeless tobacco: Never  Substance Use Topics   Alcohol use: No   Drug use: No         OPHTHALMIC EXAM:  Base Eye Exam     Visual Acuity (ETDRS)       Right Left   Dist [place] 20/30 20/40 -1   Dist ph [place]  20/30 -2         Tonometry (Tonopen, 3:40 PM)       Right Left   Pressure 20 16         Pupils       Pupils Dark Light APD   Right PERRL 4 3 None   Left PERRL 4 3 None         Visual Fields (Counting fingers)       Left Right     Full   Restrictions Partial outer inferior nasal deficiency          Extraocular Movement       Right Left    Full Full         Neuro/Psych     Oriented x3: Yes   Mood/Affect: Normal         Dilation     Both eyes: 1.0% Mydriacyl, 2.5% Phenylephrine @ 3:40 PM           Slit Lamp and Fundus Exam     External Exam       Right Left   External Normal Normal         Slit Lamp Exam       Right Left   Lens Centered posterior chamber intraocular lens Centered posterior chamber intraocular lens         Fundus Exam       Right Left   Posterior Vitreous Clear, avitric Posterior vitreous detachment, Central vitreous floaters   Disc Normal Normal   C/D Ratio 0.7 0.7   Macula Normal Normal   Vessels Normal Normal   Periphery Old chorioretinal scars peripherally, laser retinopexy No retinal tears or holes, [place] degeneration            IMAGING AND PROCEDURES  Imaging and Procedures for 11/22/20  Color Fundus Photography Optos - OU - Both Eyes       Right Eye Progression has no prior data. Disc findings include increased cup to disc ratio. Macula : normal observations. Vessels : normal observations.   Left  Eye Progression has no prior data. Disc  findings include increased cup to disc ratio. Macula : normal observations. Vessels : normal observations. Periphery : degeneration.   Notes Good chorioretinal scarring postsurgical right eye             ASSESSMENT/PLAN:  Posterior vitreous detachment, left eye OS physiologic, no holes or tears currently  Pseudophakia, both eyes Stable OU  History of retinal detachment Stable OD today     ICD-10-CM   1. Vitreous membranes and strands, left eye  H43.312 Color Fundus Photography Optos - OU - Both Eyes    2. Postsurgical chorioretinal scar of right eye  H59.811 Color Fundus Photography Optos - OU - Both Eyes    3. Posterior vitreous detachment, left eye  H43.812     4. Pseudophakia, both eyes  Z96.1     5. History of retinal detachment  Z86.69       1.  OS with history of loaders, and current large PVD and vitreous strands, but no there are no retinal holes or tears seen in the retinal periphery today Patient understands critical importance of notifying either Dr. Santiago Bumpers or this office of new onset sparkling lights new onset shadow darkness or profound worsening of current dark spots in the left eye 2.  History of retinal detachment right eye looks great  3.  Ophthalmic Meds Ordered this visit:  No orders of the defined types were placed in this encounter.      Return in about 6 months (around 05/22/2021) for DILATE OU, COLOR FP.  There are no Patient Instructions on file for this visit.   Explained the diagnoses, plan, and follow up with the patient and they expressed understanding.  Patient expressed understanding of the importance of proper follow up care.   Alford Highland Ellieana Dolecki M.D. Diseases & Surgery of the Retina and Vitreous [facility] 11/22/20     Abbreviations: M myopia (nearsighted); A astigmatism; H hyperopia (farsighted); P presbyopia; Mrx spectacle prescription;  CTL contact lenses; OD  right eye; OS left eye; OU both eyes  XT exotropia; ET esotropia; PEK punctate epithelial keratitis; PEE punctate epithelial erosions; DES dry eye syndrome; MGD meibomian gland dysfunction; ATs artificial tears; PFAT's preservative free artificial tears; NSC nuclear sclerotic cataract; PSC posterior subcapsular cataract; ERM epi-retinal membrane; PVD posterior vitreous detachment; RD retinal detachment; DM diabetes mellitus; DR diabetic retinopathy; NPDR non-proliferative diabetic retinopathy; PDR proliferative diabetic retinopathy; CSME clinically significant macular edema; DME diabetic macular edema; dbh dot blot hemorrhages; CWS cotton wool spot; POAG primary open angle glaucoma; C/D cup-to-disc ratio; HVF humphrey visual field; GVF goldmann visual field; OCT optical coherence tomography; IOP intraocular pressure; BRVO Branch retinal vein occlusion; CRVO central retinal vein occlusion; CRAO central retinal artery occlusion; BRAO branch retinal artery occlusion; RT retinal tear; SB scleral buckle; PPV pars plana vitrectomy; VH Vitreous hemorrhage; PRP panretinal laser photocoagulation; IVK intravitreal kenalog; VMT vitreomacular traction; MH Macular hole;  NVD neovascularization of the disc; NVE neovascularization elsewhere; AREDS age related eye disease study; ARMD age related macular degeneration; POAG primary open angle glaucoma; EBMD epithelial/anterior basement membrane dystrophy; ACIOL anterior chamber intraocular lens; IOL intraocular lens; PCIOL posterior chamber intraocular lens; Phaco/IOL phacoemulsification with intraocular lens placement; PRK photorefractive keratectomy; LASIK laser assisted in situ keratomileusis; HTN hypertension; DM diabetes mellitus; COPD chronic obstructive pulmonary disease

## 2020-11-22 NOTE — Assessment & Plan Note (Signed)
Stable OU 

## 2020-11-22 NOTE — Assessment & Plan Note (Signed)
Stable OD today

## 2020-11-22 NOTE — Assessment & Plan Note (Signed)
OS physiologic, no holes or tears currently

## 2020-11-24 ENCOUNTER — Other Ambulatory Visit: Payer: Self-pay

## 2020-11-24 ENCOUNTER — Ambulatory Visit: Payer: PPO | Admitting: Nurse Practitioner

## 2020-11-24 ENCOUNTER — Encounter: Payer: Self-pay | Admitting: Nurse Practitioner

## 2020-11-24 VITALS — BP 140/82 | HR 71 | Ht 67.0 in | Wt 234.2 lb

## 2020-11-24 DIAGNOSIS — I350 Nonrheumatic aortic (valve) stenosis: Secondary | ICD-10-CM | POA: Diagnosis not present

## 2020-11-24 DIAGNOSIS — I5032 Chronic diastolic (congestive) heart failure: Secondary | ICD-10-CM | POA: Diagnosis not present

## 2020-11-24 DIAGNOSIS — E785 Hyperlipidemia, unspecified: Secondary | ICD-10-CM | POA: Diagnosis not present

## 2020-11-24 DIAGNOSIS — I200 Unstable angina: Secondary | ICD-10-CM | POA: Diagnosis not present

## 2020-11-24 DIAGNOSIS — I2510 Atherosclerotic heart disease of native coronary artery without angina pectoris: Secondary | ICD-10-CM

## 2020-11-24 DIAGNOSIS — I10 Essential (primary) hypertension: Secondary | ICD-10-CM | POA: Diagnosis not present

## 2020-11-24 DIAGNOSIS — N182 Chronic kidney disease, stage 2 (mild): Secondary | ICD-10-CM

## 2020-11-24 MED ORDER — EZETIMIBE 10 MG PO TABS: 10.0000 mg | ORAL_TABLET | Freq: Every day | ORAL | 3 refills | Status: DC

## 2020-11-24 NOTE — Progress Notes (Signed)
Office Visit    Patient Name: Jared Baldwin Date of Encounter: 11/24/2020  Primary Care Provider:  Lynnea Ferrier, MD Primary Cardiologist:  Lorine Bears, MD  Chief Complaint    75 year old male with a history of mild aortic stenosis, hypertension, hyperlipidemia, hypothyroidism, stage II chronic kidney disease, obesity status post gastric sleeve, chronic back pain, and GERD, who presents for follow-up after recent diagnostic catheterization revealing nonobstructive CAD.  Past Medical History    Past Medical History:  Diagnosis Date   Aortic stenosis    a. 02/2012 Echo: EF 60-65%, very mild AS; b. 11/2020 Echo: EF 60-65%, GrI DD, no rwma, Nl RV size/fxn, RVSP 23.58mmHg, Mild AS/AI (AoV mean grad ; Vmax 2.70m/s).   Arthritis    CAD (coronary artery disease)    11/2020 Cath: LM 30ost/m, LAD 60p (nl iFR @ 0.93), 57m, 50d, LCX 59m, OM1/2 nl, OM3 min irregs, RCA 65p, 30d, RPDA 50. Mild AS w/ peak grad of 12-61mmHg. RHC w/ mild PAH. **Avoid future cath via R Radial due to signif prox R RA stenosis**   Chronic back pain    CKD (chronic kidney disease), stage II    GERD (gastroesophageal reflux disease)    Hyperlipidemia    Hypertension    Hypothyroidism    Obesity    a. s/p gastric sleeve.   Past Surgical History:  Procedure Laterality Date   BACK SURGERY     CATARACT EXTRACTION W/ INTRAOCULAR LENS  IMPLANT, BILATERAL     CHOLECYSTECTOMY N/A 09/09/2016   Procedure: LAPAROSCOPIC CHOLECYSTECTOMY WITH INTRAOPERATIVE CHOLANGIOGRAM;  Surgeon: Henrene Dodge, MD;  Location: [facility];  Service: General;  Laterality: N/A;   CORONARY PRESSURE WIRE/FFR WITH 3D MAPPING N/A 11/05/2020   Procedure: Coronary Pressure Wire/FFR w/3D Mapping;  Surgeon: Iran Ouch, MD;  Location: [facility] INVASIVE CV LAB;  Service: Cardiovascular;  Laterality: N/A;   HERNIA REPAIR     KNEE ARTHROPLASTY Right 07/24/2016   Procedure: COMPUTER ASSISTED TOTAL KNEE ARTHROPLASTY;  Surgeon: Donato Heinz, MD;   Location: [facility];  Service: Orthopedics;  Laterality: Right;   RIGHT/LEFT HEART CATH AND CORONARY ANGIOGRAPHY Bilateral 11/05/2020   Procedure: RIGHT/LEFT HEART CATH AND CORONARY ANGIOGRAPHY;  Surgeon: Iran Ouch, MD;  Location: [facility] INVASIVE CV LAB;  Service: Cardiovascular;  Laterality: Bilateral;   TOTAL KNEE ARTHROPLASTY     UMBILICAL HERNIA REPAIR N/A 09/09/2016   Procedure: HERNIA REPAIR UMBILICAL ADULT;  Surgeon: Henrene Dodge, MD;  Location: [facility];  Service: General;  Laterality: N/A;    Allergies  Allergies  Allergen Reactions   Lipitor [Atorvastatin]     Myalgias    Simvastatin    Statins Other (See Comments)    Muscle pain with atorvastatin and simvastatin    History of Present Illness    75 year old male with above past medical history including mild aortic stenosis, hypertension, hyperlipidemia, hypothyroidism, stage II chronic kidney disease, obesity status post gastric sleeve, chronic back pain, and GERD.  He was previous evaluated in 2014 for atypical chest pain and dyspnea.  Stress testing showed no evidence of ischemia or infarct.  Echocardiogram showed normal LV function with mild aortic stenosis.  He was recently evaluated on October 25, secondary to intermittent substernal chest discomfort that occurred over the preceding weekend, initially lasting 15 to 30 minutes, and resolving spontaneously.  He was placed on aspirin therapy and scheduled for diagnostic catheterization.  He also underwent echocardiogram that showed an EF of 60 to 65%, grade 1 diastolic dysfunction, mildly  elevated RVSP at 23.8 mmHg, and mild AS/AI.  Diagnostic catheterization was performed on November 4 revealing moderate nonobstructive CAD including up to a 60% stenosis in the proximal LAD with normal iFR.  He was felt to have mild aortic stenosis with a peak gradient of 12 to 15 mmHg.  Right heart catheterization did reveal mildly elevated right heart filling pressures and mild pulmonary  hypertension.  Aggressive medical therapy was recommended with a target LDL of less than 55.  Since catheterization, he has felt well.  He denies chest pain or dyspnea.  His wife is present with him today and we had a long discussion about risk factor modifications.  Patient tolerates pravastatin therapy but did not tolerate high potency statins in the past secondary to myalgias.  He would be willing to consider Zetia or even a PCSK9 inhibitor but after discussion today, committed to changing his diet, which largely consists of fried and fatty foods such as hamburgers and [place] fries.  He also is currently inactive and we discussed the importance of regular exercise.  His wife notes that she is joining a gym and he will join with her.  He denies palpitations, PND, orthopnea, dizziness, syncope, edema, or early satiety.  Home Medications    Current Outpatient Medications  Medication Sig Dispense Refill   aspirin EC 81 MG tablet Take 81 mg by mouth daily. Swallow whole.     chlorthalidone (HYGROTON) 25 MG tablet Take 25 mg by mouth daily.     Cholecalciferol (VITAMIN D) 50 MCG (2000 UT) tablet Take 4,000 Units by mouth daily.     Cyanocobalamin 5000 MCG/ML LIQD Take 5,000 mcg by mouth daily.     escitalopram (LEXAPRO) 5 MG tablet Take 5 mg by mouth daily.     ezetimibe (ZETIA) 10 MG tablet Take 1 tablet (10 mg total) by mouth daily. 90 tablet 3   gabapentin (NEURONTIN) 300 MG capsule Take 300 mg by mouth 3 (three) times daily.     levothyroxine (SYNTHROID, LEVOTHROID) 88 MCG tablet Take 88 mcg by mouth daily before breakfast.     losartan (COZAAR) 25 MG tablet Take 25 mg by mouth daily.     omeprazole (PRILOSEC) 40 MG capsule Take 40 mg by mouth daily.     potassium chloride (KLOR-CON) 10 MEQ tablet Take 10 mEq by mouth daily.     pravastatin (PRAVACHOL) 80 MG tablet Take 80 mg by mouth daily.     tamsulosin (FLOMAX) 0.4 MG CAPS capsule Take 0.4 mg by mouth daily.     tiZANidine (ZANAFLEX) 4 MG  tablet Take by mouth 3 (three) times daily as needed.     traZODone (DESYREL) 50 MG tablet Take 50 mg by mouth at bedtime.     TURMERIC CURCUMIN PO Take 800 mg by mouth daily.     No current facility-administered medications for this visit.     Review of Systems    Doing well since diagnostic catheterization.  He denies chest pain, palpitations, dyspnea, pnd, orthopnea, n, v, dizziness, syncope, edema, weight gain, or early satiety.  All other systems reviewed and are otherwise negative except as noted above.    Physical Exam    VS:  BP 140/82   Pulse 71   Ht 5\' 7"  (1.702 m)   Wt 234 lb 4 oz (106.3 kg)   SpO2 98%   BMI 36.69 kg/m  , BMI Body mass index is 36.69 kg/m.     Vitals:   11/24/20 1502 11/24/20 1646  BP: 138/60 140/82  Pulse: 71   SpO2: 98%   Weight: 234 lb 4 oz (106.3 kg)   Height: 5\' 7"  (1.702 m)     GEN: Obese, in no acute distress. HEENT: normal. Neck: Supple, no JVD, carotid bruits, or masses. Cardiac: RRR, 2/6 systolic murmur loudest at the upper sternal borders, no rubs, or gallops. No clubbing, cyanosis, edema.  Radials/PT 2+ and equal bilaterally.  Right radial cath site without bleeding, bruit, or hematoma Respiratory:  Respirations regular and unlabored, clear to auscultation bilaterally. GI: Soft, nontender, nondistended, BS + x 4. MS: no deformity or atrophy. Skin: warm and dry, no rash. Neuro:  Strength and sensation are intact. Psych: Normal affect.  Accessory Clinical Findings    ECG personally reviewed by me today -regular sinus rhythm, 71, baseline artifact - no acute changes.  Lab Results  Component Value Date   WBC 8.5 10/26/2020   HGB 13.5 10/26/2020   HCT 40.0 10/26/2020   MCV 90 10/26/2020   PLT 235 10/26/2020   Lab Results  Component Value Date   CREATININE 1.59 (H) 10/26/2020   BUN 27 10/26/2020   NA 139 10/26/2020   K 3.5 10/26/2020   CL 99 10/26/2020   CO2 23 10/26/2020   Labs dated October 13, 2020 from care  everywhere:  Total cholesterol 174, triglycerides 128, HDL 43.8, LDL 105 Total bilirubin 0.7, alkaline phosphatase 79, AST 19, ALT 18  Assessment & Plan    1.  Unstable angina/coronary artery disease: Patient recently evaluated on October 25 with complaints of substernal chest pain.  Diagnostic catheterization revealed moderate, diffuse, nonobstructive disease with normal iFR in the LAD (60% stenosis).  Aggressive risk factor modification and medical therapy was recommended.  He has done well since his catheterization.  We discussed his cath findings and medical therapy at length today and also discussed the importance of lifestyle modifications, sodium restriction, weight loss, and aggressive lipid management.  Continue aspirin and statin therapy.  In the setting of an LDL of 105, he was agreeable to add Zetia.  2.  Essential hypertension: Blood pressure elevated today at 138/60 and a repeat of 140/82.  We discussed potentially increasing losartan to 50 mg daily however, we agreed that he can check his blood pressure over the next 2 weeks to establish a trend and if he is consistently greater than 130 at that point, to contact us and we will send in the prescription for the higher dose of losartan.  He otherwise remains on chlorthalidone.  3.  Hyperlipidemia: He was previously intolerant to atorvastatin and simvastatin.  His wife also thinks that he did not tolerate rosuvastatin.  He is currently on pravastatin 80 mg with an LDL of 105 in October.  As above, we discussed the importance of lifestyle modifications with regular exercise and weight loss.  He also has a diet that is high in saturated fats and fried foods.  He says he will commit to changing his diet and increasing his activity with a goal of weight loss.  He was agreeable to start Zetia 10 mg daily.  We discussed that in the absence of lifestyle modifications, I suspect we will not be able to achieve a goal LDL of less than 55 with the  addition of Zetia alone and he will likely need PCSK9 inhibitor in the future.  4.  Mild aortic stenosis: Systolic murmur on examination.  Mild AS by echo and by gradient on left heart cath.  5.  Stage II  chronic kidney disease: Baseline creatinine approximately 1.5.  He remains on losartan therapy.  6.  Morbid obesity: As above, discussed the importance of regular exercise, caloric restriction, and weight loss.  He is previously status post gastric sleeve.  May derive benefit from addition of semaglutide in the future.  7.  HFpEF: Mildly elevated right heart pressures on diagnostic catheterization.  He is on chlorthalidone and that his body habitus makes exam somewhat challenging, he does not appear to be markedly volume overloaded.  Discussed the importance of sodium restriction and daily weights as well as good blood pressure management.  As above, he will follow blood pressure at home and contact us within the next 2 weeks with some numbers/trends.  8.  Disposition: Follow-up in clinic in 1 month with plan to repeat lipids at that time.  Murray Hodgkins, NP 11/24/2020, 4:53 PM

## 2020-11-24 NOTE — Patient Instructions (Addendum)
Medication Instructions:  - Your physician has recommended you make the following change in your medication:   1) START Zetia (ezetimibe) 10 mg: - take 1 tablet by mouth once daily  *If you need a refill on your cardiac medications before your next appointment, please call your pharmacy*   Lab Work: - none ordered  If you have labs (blood work) drawn today and your tests are completely normal, you will receive your results only by: [place] (if you have MyChart) OR A paper copy in the mail If you have any lab test that is abnormal or we need to change your treatment, we will call you to review the results.   Testing/Procedures: - none ordered   Follow-Up: At [facility], you and your health needs are our priority.  As part of our continuing mission to provide you with exceptional heart care, we have created designated Provider Care Teams.  These Care Teams include your primary Cardiologist (physician) and Advanced Practice Providers (APPs -  Physician Assistants and Nurse Practitioners) who all work together to provide you with the care you need, when you need it.  We recommend signing up for the patient portal called "MyChart".  Sign up information is provided on this After Visit Summary.  MyChart is used to connect with patients for Virtual Visits (Telemedicine).  Patients are able to view lab/test results, encounter notes, upcoming appointments, etc.  Non-urgent messages can be sent to your provider as well.   To learn more about what you can do with MyChart, go to [email or web address].    Your next appointment:   1 month(s)  The format for your next appointment:   In Person  Provider:   You may see Kathlyn Sacramento, MD or one of the following Advanced Practice Providers on your designated Care Team:    Murray Hodgkins, NP -    Other Instructions   1) Please try to obtain a blood pressure cuff to monitor your blood pressure readings at home  How to Take  Your Blood Pressure Blood pressure measures how strongly your blood is pressing against the walls of your arteries. Arteries are blood vessels that carry blood from your heart throughout your body. You can take your blood pressure at home with a machine. You may need to check your blood pressure at home: To check if you have high blood pressure (hypertension). To check your blood pressure over time. To make sure your blood pressure medicine is working. Supplies needed: Blood pressure machine, or monitor. Dining room chair to sit in. Table or desk. Small notebook. Pencil or pen. How to prepare Avoid these things for 30 minutes before checking your blood pressure: Having drinks with caffeine in them, such as coffee or tea. Drinking alcohol. Eating. Smoking. Exercising. Do these things five minutes before checking your blood pressure: Go to the bathroom and pee (urinate). Sit in a dining chair. Do not sit on a soft couch or an armchair. Be quiet. Do not talk. How to take your blood pressure Follow the instructions that came with your machine. If you have a digital blood pressure monitor, these may be the instructions: Sit up straight. Place your feet on the floor. Do not cross your ankles or legs. Rest your left arm at the level of your heart. You may rest it on a table, desk, or chair. Pull up your shirt sleeve. Wrap the blood pressure cuff around the upper part of your left arm. The cuff should be 1 inch (2.5  cm) above your elbow. It is best to wrap the cuff around bare skin. Fit the cuff snugly around your arm. You should be able to place only one finger between the cuff and your arm. Place the cord so that it rests in the bend of your elbow. Press the power button. Sit quietly while the cuff fills with air and loses air. Write down the numbers on the screen. Wait 2-3 minutes and then repeat steps 1-10. What do the numbers mean? Two numbers make up your blood pressure. The first  number is called systolic pressure. The second is called diastolic pressure. An example of a blood pressure reading is "120 over 80" (or 120/80). If you are an adult and do not have a medical condition, use this guide to find out if your blood pressure is normal: Normal First number: below 120. Second number: below 80. Elevated First number: 120-129. Second number: below 80. Hypertension stage 1 First number: 130-139. Second number: 80-89. Hypertension stage 2 First number: 140 or above. Second number: 90 or above. Your blood pressure is above normal even if only the first or only the second number is above normal. Follow these instructions at home: Medicines Take over-the-counter and prescription medicines only as told by your doctor. Tell your doctor if your medicine is causing side effects. General instructions Check your blood pressure as often as your doctor tells you to. Check your blood pressure at the same time every day. Take your monitor to your next doctor's appointment. Your doctor will: Make sure you are using it correctly. Make sure it is working right. Understand what your blood pressure numbers should be. Keep all follow-up visits as told by your doctor. This is important. General tips You will need a blood pressure machine, or monitor. Your doctor can suggest a monitor. You can buy one at a drugstore or online. When choosing one: Choose one with an arm cuff. Choose one that wraps around your upper arm. Only one finger should fit between your arm and the cuff. Do not choose one that measures your blood pressure from your wrist or finger. Where to find more information American Heart Association: www.heart.org Contact a doctor if: Your blood pressure keeps being high. Your blood pressure is suddenly low. Get help right away if: Your first blood pressure number is higher than 180. Your second blood pressure number is higher than 120. Summary Check your blood  pressure at the same time every day. Avoid caffeine, alcohol, smoking, and exercise for 30 minutes before checking your blood pressure. Make sure you understand what your blood pressure numbers should be. This information is not intended to replace advice given to you by your health care provider. Make sure you discuss any questions you have with your health care provider. Document Revised: 10/29/2019 Document Reviewed: 12/13/2018 Elsevier Patient Education  2022 Elsevier Inc.    2) ZETIA (Ezetimibe) Tablets What is this medication? EZETIMIBE (ez ET i mibe) treats high cholesterol. It works by reducing the amount of cholesterol absorbed from the food you eat. This decreases the amount of bad cholesterol (such as LDL) in your blood. Changes to diet and exercise are often combined with this medication. This medicine may be used for other purposes; ask your health care provider or pharmacist if you have questions. COMMON BRAND NAME(S): Zetia What should I tell my care team before I take this medication? They need to know if you have any of these conditions: Kidney disease Liver disease Muscle cramps, pain  Muscle injury Thyroid disease An unusual or allergic reaction to ezetimibe, other medications, foods, dyes, or preservatives Pregnant or trying to get pregnant Breast-feeding How should I use this medication? Take this medication by mouth. Take it as directed on the prescription label at the same time every day. You can take it with or without food. If it upsets your stomach, take it with food. Keep taking it unless your care team tells you to stop. Take bile acid sequestrants at a different time of day than this medication. Take this medication 2 hours BEFORE or 4 hours AFTER bile acid sequestrants. Talk to your care team about the use of this medication in children. While it may be prescribed for children as young as 10 for selected conditions, precautions do apply. Overdosage: If you  think you have taken too much of this medicine contact a poison control center or emergency room at once. NOTE: This medicine is only for you. Do not share this medicine with others. What if I miss a dose? If you miss a dose, take it as soon as you can. If it is almost time for your next dose, take only that dose. Do not take double or extra doses. What may interact with this medication? Do not take this medication with any of the following: Fenofibrate Gemfibrozil This medication may also interact with the following: Antacids Cyclosporine Herbal medications like red yeast rice Other medications to lower cholesterol or triglycerides This list may not describe all possible interactions. Give your health care provider a list of all the medicines, herbs, non-prescription drugs, or dietary supplements you use. Also tell them if you smoke, drink alcohol, or use illegal drugs. Some items may interact with your medicine. What should I watch for while using this medication? Visit your care team for regular checks on your progress. Tell your care team if your symptoms do not start to get better or if they get worse. Your care team may tell you to stop taking this medication if you develop muscle problems. If your muscle problems do not go away after stopping this medication, contact your care team. Do not become pregnant while taking this medication. Women should inform their care team if they wish to become pregnant or think they might be pregnant. There is potential for serious harm to an unborn child. Talk to your care team for more information. Do not breast-feed an infant while taking this medication. Taking this medication is only part of a total heart healthy program. Your care team may give you a special diet to follow. Avoid alcohol. Avoid smoking. Ask your care team how much you should exercise. What side effects may I notice from receiving this medication? Side effects that you should report to  your doctor or health care provider as soon as possible: Allergic reactions--skin rash, itching or hives, swelling of the face, lips, tongue, or throat Side effects that usually do not require medical attention (report to your doctor or health care provider if they continue or are bothersome): Diarrhea Joint pain This list may not describe all possible side effects. Call your doctor for medical advice about side effects. You may report side effects to FDA at 1-800-FDA-1088. Where should I keep my medication? Keep out of the reach of children and pets. Store at room temperature between 15 and 30 degrees C (59 and 86 degrees F). Protect from moisture. Get rid of any unused medication after the expiration date. NOTE: This sheet is a summary. It may not  cover all possible information. If you have questions about this medicine, talk to your doctor, pharmacist, or health care provider.  2022 Elsevier/Gold Standard (2020-01-16 00:00:00)

## 2020-12-02 ENCOUNTER — Telehealth: Payer: Self-pay | Admitting: Cardiovascular Disease

## 2020-12-02 ENCOUNTER — Telehealth: Payer: Self-pay | Admitting: Nurse Practitioner

## 2020-12-02 DIAGNOSIS — I10 Essential (primary) hypertension: Secondary | ICD-10-CM

## 2020-12-02 MED ORDER — LOSARTAN POTASSIUM 50 MG PO TABS
50.0000 mg | ORAL_TABLET | Freq: Every day | ORAL | 5 refills | Status: DC
Start: 2020-12-02 — End: 2021-03-09

## 2020-12-02 NOTE — Telephone Encounter (Signed)
Pt c/o BP issue: STAT if pt c/o blurred vision, one-sided weakness or slurred speech  1. What are your last 5 BP readings? 159/60, 173/74, 173/74  2. Are you having any other symptoms (ex. Dizziness, headache, blurred vision, passed out)? dizzy  3. What is your BP issue? High BP

## 2020-12-02 NOTE — Telephone Encounter (Signed)
Patient was last seen by Ward Givens, NP on 11/24/20.  Per Chris's note.  "Essential hypertension: Blood pressure elevated today at 138/60 and a repeat of 140/82.  We discussed potentially increasing losartan to 50 mg daily however, we agreed that he can check his blood pressure over the next 2 weeks to establish a trend and if he is consistently greater than 130 at that point, to contact us and we will send in the prescription for the higher dose of losartan.  He otherwise remains on chlorthalidone."  Message fwd to CB to advise

## 2020-12-02 NOTE — Telephone Encounter (Signed)
error 

## 2020-12-02 NOTE — Telephone Encounter (Signed)
Patient calling to check on status.

## 2020-12-02 NOTE — Telephone Encounter (Signed)
Not sure that his dizziness is associated with his moderately elevated blood pressures, but please have him increase losartan to 50mg  daily.  F/u bmet in in 1 week.

## 2020-12-02 NOTE — Telephone Encounter (Signed)
Patient made aware of Ward Givens, NP response and recommendation. Patient is agreeable with the plan. Rx for Losartan 50 mg daily sent to the patients pharmacy. Lab appt for bmp scheduled for 12/10/20 @ 3 pm.   Patient sts that he is sometimes dizzy when he awakes in the morning and getting out of bed. Adv the patient to take his time getting out of bed, sit for a 1-2 min then stand. Adv the patient to continue to monitor his BP and to contact the office if his BP is still consistently elevated. Patient verbalized understanding and voiced appreciation for the call.

## 2020-12-10 ENCOUNTER — Other Ambulatory Visit (INDEPENDENT_AMBULATORY_CARE_PROVIDER_SITE_OTHER): Payer: PPO

## 2020-12-10 ENCOUNTER — Other Ambulatory Visit: Payer: Self-pay

## 2020-12-10 DIAGNOSIS — I10 Essential (primary) hypertension: Secondary | ICD-10-CM

## 2020-12-11 LAB — BASIC METABOLIC PANEL
BUN/Creatinine Ratio: 19 (ref 10–24)
BUN: 29 mg/dL — ABNORMAL HIGH (ref 8–27)
CO2: 25 mmol/L (ref 20–29)
Calcium: 8.9 mg/dL (ref 8.6–10.2)
Chloride: 103 mmol/L (ref 96–106)
Creatinine, Ser: 1.56 mg/dL — ABNORMAL HIGH (ref 0.76–1.27)
Glucose: 112 mg/dL — ABNORMAL HIGH (ref 70–99)
Potassium: 3.6 mmol/L (ref 3.5–5.2)
Sodium: 142 mmol/L (ref 134–144)
eGFR: 46 mL/min/{1.73_m2} — ABNORMAL LOW (ref >59)

## 2021-01-19 DIAGNOSIS — H27121 Anterior dislocation of lens, right eye: Secondary | ICD-10-CM | POA: Diagnosis not present

## 2021-01-21 DIAGNOSIS — Z9889 Other specified postprocedural states: Secondary | ICD-10-CM | POA: Diagnosis not present

## 2021-01-27 ENCOUNTER — Encounter: Payer: Self-pay | Admitting: Cardiovascular Disease

## 2021-01-27 ENCOUNTER — Other Ambulatory Visit: Payer: Self-pay

## 2021-01-27 ENCOUNTER — Ambulatory Visit: Payer: PPO | Admitting: Cardiovascular Disease

## 2021-01-27 VITALS — BP 128/68 | HR 77 | Ht 68.0 in | Wt 240.0 lb

## 2021-01-27 DIAGNOSIS — E785 Hyperlipidemia, unspecified: Secondary | ICD-10-CM

## 2021-01-27 DIAGNOSIS — I359 Nonrheumatic aortic valve disorder, unspecified: Secondary | ICD-10-CM

## 2021-01-27 DIAGNOSIS — I10 Essential (primary) hypertension: Secondary | ICD-10-CM

## 2021-01-27 DIAGNOSIS — I2510 Atherosclerotic heart disease of native coronary artery without angina pectoris: Secondary | ICD-10-CM

## 2021-01-27 MED ORDER — CHLORTHALIDONE 25 MG PO TABS: 25.0000 mg | ORAL_TABLET | Freq: Every day | ORAL | 2 refills | Status: DC

## 2021-01-27 NOTE — Progress Notes (Signed)
Cardiology Office Note   Date:  01/27/2021   ID:  Koury, Zamora Apr 30, 1945, MRN [id number]  PCP:  Lynnea Ferrier, MD  Cardiologist:   Lorine Bears, MD   Chief Complaint  Patient presents with   Other    1 month f/u no complaints today. Meds reviewed verbally with pt.      History of Present Illness: Jared Baldwin is a 76 y.o. male who is here today for follow-up visit regarding moderate two-vessel coronary artery disease and mild aortic stenosis.  He has chronic medical conditions including obesity status post gastric sleeve surgery, chronic back pain, GERD, hyperlipidemia, essential hypertension and hypothyroidism. He was seen recently for chest pain and shortness of breath.  I proceeded with a right and left cardiac catheterization in November which showed moderate two-vessel coronary artery disease with moderate to severe calcifications especially in the proximal LAD.  The stenosis in the proximal LAD was interrogated with fractional flow reserve and was not significant with an IFR ratio of 0.93.  He was found to have mild aortic stenosis with peak gradient of 12 to 15 mmHg.  Right heart catheterization showed mildly elevated filling pressures, mild pulmonary hypertension and normal cardiac output. He has been doing reasonably well denies any recurrent chest pain.  He reports stable exertional dyspnea.  He is working on improving his diet but it has been a very difficult process for him.  During last visit, Zetia was added to pravastatin given intolerance to more potent statins.   Past Medical History:  Diagnosis Date   Aortic stenosis    a. 02/2012 Echo: EF 60-65%, very mild AS; b. 11/2020 Echo: EF 60-65%, GrI DD, no rwma, Nl RV size/fxn, RVSP 23.70mmHg, Mild AS/AI (AoV mean grad ; Vmax 2.28m/s).   Arthritis    CAD (coronary artery disease)    11/2020 Cath: LM 30ost/m, LAD 60p (nl iFR @ 0.93), 78m, 50d, LCX 73m, OM1/2 nl, OM3 min irregs, RCA 65p, 30d, RPDA 50.  Mild AS w/ peak grad of 12-45mmHg. RHC w/ mild PAH. **Avoid future cath via R Radial due to signif prox R RA stenosis**   Chronic back pain    CKD (chronic kidney disease), stage II    GERD (gastroesophageal reflux disease)    Hyperlipidemia    Hypertension    Hypothyroidism    Obesity    a. s/p gastric sleeve.    Past Surgical History:  Procedure Laterality Date   BACK SURGERY     CATARACT EXTRACTION W/ INTRAOCULAR LENS  IMPLANT, BILATERAL     CHOLECYSTECTOMY N/A 09/09/2016   Procedure: LAPAROSCOPIC CHOLECYSTECTOMY WITH INTRAOPERATIVE CHOLANGIOGRAM;  Surgeon: Henrene Dodge, MD;  Location: [facility] ORS;  Service: General;  Laterality: N/A;   CORONARY PRESSURE WIRE/FFR WITH 3D MAPPING N/A 11/05/2020   Procedure: Coronary Pressure Wire/FFR w/3D Mapping;  Surgeon: Iran Ouch, MD;  Location: [facility];  Service: Cardiovascular;  Laterality: N/A;   HERNIA REPAIR     KNEE ARTHROPLASTY Right 07/24/2016   Procedure: COMPUTER ASSISTED TOTAL KNEE ARTHROPLASTY;  Surgeon: Donato Heinz, MD;  Location: [facility] ORS;  Service: Orthopedics;  Laterality: Right;   RIGHT/LEFT HEART CATH AND CORONARY ANGIOGRAPHY Bilateral 11/05/2020   Procedure: RIGHT/LEFT HEART CATH AND CORONARY ANGIOGRAPHY;  Surgeon: Iran Ouch, MD;  Location: [facility];  Service: Cardiovascular;  Laterality: Bilateral;   TOTAL KNEE ARTHROPLASTY     UMBILICAL HERNIA REPAIR N/A 09/09/2016   Procedure: HERNIA REPAIR UMBILICAL ADULT;  Surgeon:  Olean Ree, MD;  Location: [facility] OR[facility];  Service: General;  Laterality: N/A;     Current Outpatient Medications  Medication Sig Dispense Refill   aspirin EC 81 MG tablet Take 81 mg by mouth daily. Swallow whole.     Cholecalciferol (VITAMIN D) 50 MCG (2000 UT) tablet Take 4,000 Units by mouth daily.     Cyanocobalamin 5000 MCG/ML LIQD Take 5,000 mcg by mouth daily.     escitalopram (LEXAPRO) 5 MG tablet Take 5 mg by mouth daily.     ezetimibe (ZETIA) 10 MG tablet Take 1  tablet (10 mg total) by mouth daily. 90 tablet 3   levothyroxine (SYNTHROID, LEVOTHROID) 88 MCG tablet Take 88 mcg by mouth daily before breakfast.     losartan (COZAAR) 50 MG tablet Take 1 tablet (50 mg total) by mouth daily. 30 tablet 5   omeprazole (PRILOSEC) 40 MG capsule Take 40 mg by mouth daily.     potassium chloride (KLOR-CON) 10 MEQ tablet Take 10 mEq by mouth daily.     pravastatin (PRAVACHOL) 80 MG tablet Take 80 mg by mouth daily.     tiZANidine (ZANAFLEX) 4 MG tablet Take by mouth 3 (three) times daily as needed.     traZODone (DESYREL) 50 MG tablet Take 50 mg by mouth at bedtime.     TURMERIC CURCUMIN PO Take 800 mg by mouth daily.     chlorthalidone (HYGROTON) 25 MG tablet Take 1 tablet (25 mg total) by mouth daily. 90 tablet 2   gabapentin (NEURONTIN) 300 MG capsule Take 300 mg by mouth 3 (three) times daily. (Patient not taking: Reported on 01/27/2021)     tamsulosin (FLOMAX) 0.4 MG CAPS capsule Take 0.4 mg by mouth daily. (Patient not taking: Reported on 01/27/2021)     No current facility-administered medications for this visit.    Allergies:   Lipitor [atorvastatin], Simvastatin, and Statins    Social History:  The patient  reports that he quit smoking about 23 years ago. His smoking use included cigarettes. He smoked an average of 2.00 packs per day. He has never used smokeless tobacco. He reports that he does not drink alcohol and does not use drugs.   Family History:  The patient's family history is negative for coronary artery disease.   ROS:  Please see the history of present illness.   Otherwise, review of systems are positive for none.   All other systems are reviewed and negative.    PHYSICAL EXAM: VS:  BP 128/68 (BP Location: Left Arm, Patient Position: Sitting, Cuff Size: Normal)    Pulse 77    Ht 5\' 8"  (1.727 m)    Wt 240 lb (108.9 kg)    SpO2 97%    BMI 36.49 kg/m  , BMI Body mass index is 36.49 kg/m. GEN: Well nourished, well developed, in no acute  distress  HEENT: normal  Neck: no JVD, carotid bruits, or masses Cardiac: RRR; no  rubs, or gallops,no edema .  3 /6 crescendo decrescendo systolic murmur in the aortic area which is late peaking.  S2 is diminished. Respiratory:  clear to auscultation bilaterally, normal work of breathing GI: soft, nontender, nondistended, + BS MS: no deformity or atrophy  Skin: warm and dry, no rash Neuro:  Strength and sensation are intact Psych: euthymic mood, full affect   EKG:  EKG is not ordered today.    Recent Labs: 10/26/2020: Hemoglobin 13.5; Platelets 235 12/10/2020: BUN 29; Creatinine, Ser 1.56; Potassium 3.6; Sodium 142  Lipid Panel No results found for: CHOL, TRIG, HDL, CHOLHDL, VLDL, LDLCALC, LDLDIRECT    Wt Readings from Last 3 Encounters:  01/27/21 240 lb (108.9 kg)  11/24/20 234 lb 4 oz (106.3 kg)  11/05/20 229 lb (103.9 kg)       PAD Screen 10/26/2020  Previous PAD dx? No  Previous surgical procedure? No  Pain with walking? No  Feet/toe relief with dangling? No  Painful, non-healing ulcers? No  Extremities discolored? No      ASSESSMENT AND PLAN:  1.  Coronary artery disease involving native coronary arteries without angina: The patient has moderate two-vessel coronary artery disease with moderate severe calcifications.  I discussed with him the importance of controlling his risk factors.  No indication for revascularization at the present time.  Continue aspirin 81 mg daily.  2.  Essential hypertension: Blood pressure is now controlled on current medications after increasing the dose of losartan.  I refilled his chlorthalidone.  3.  Hyperlipidemia: Currently on pravastatin 80 mg daily.  There is history of myalgia with atorvastatin and simvastatin.  In addition, Zetia was added during most recent visit.  We will reevaluate his lipid profile in the near future and consider adding a PCSK9 inhibitor.  4.  Aortic stenosis: This was mild on echocardiogram and  cardiac catheterization.  Recommend repeat echocardiogram in November of this year.    Disposition: Follow-up in 6 months.   Signed,  Kathlyn Sacramento, MD  01/27/2021 4:54 PM    [place]

## 2021-01-27 NOTE — Patient Instructions (Signed)

## 2021-02-02 DIAGNOSIS — M19011 Primary osteoarthritis, right shoulder: Secondary | ICD-10-CM | POA: Diagnosis not present

## 2021-02-02 DIAGNOSIS — M75101 Unspecified rotator cuff tear or rupture of right shoulder, not specified as traumatic: Secondary | ICD-10-CM | POA: Diagnosis not present

## 2021-02-02 DIAGNOSIS — M25511 Pain in right shoulder: Secondary | ICD-10-CM | POA: Diagnosis not present

## 2021-02-02 DIAGNOSIS — M75102 Unspecified rotator cuff tear or rupture of left shoulder, not specified as traumatic: Secondary | ICD-10-CM | POA: Diagnosis not present

## 2021-02-02 DIAGNOSIS — G8929 Other chronic pain: Secondary | ICD-10-CM | POA: Diagnosis not present

## 2021-02-02 DIAGNOSIS — M25512 Pain in left shoulder: Secondary | ICD-10-CM | POA: Diagnosis not present

## 2021-02-02 DIAGNOSIS — I700 Atherosclerosis of aorta: Secondary | ICD-10-CM | POA: Diagnosis not present

## 2021-02-02 DIAGNOSIS — M19012 Primary osteoarthritis, left shoulder: Secondary | ICD-10-CM | POA: Diagnosis not present

## 2021-03-08 DIAGNOSIS — E039 Hypothyroidism, unspecified: Secondary | ICD-10-CM | POA: Diagnosis not present

## 2021-03-08 DIAGNOSIS — E538 Deficiency of other specified B group vitamins: Secondary | ICD-10-CM | POA: Diagnosis not present

## 2021-03-08 DIAGNOSIS — I10 Essential (primary) hypertension: Secondary | ICD-10-CM | POA: Diagnosis not present

## 2021-03-08 DIAGNOSIS — E785 Hyperlipidemia, unspecified: Secondary | ICD-10-CM | POA: Diagnosis not present

## 2021-03-09 ENCOUNTER — Other Ambulatory Visit: Payer: Self-pay | Admitting: Nurse Practitioner

## 2021-03-09 DIAGNOSIS — I2510 Atherosclerotic heart disease of native coronary artery without angina pectoris: Secondary | ICD-10-CM | POA: Diagnosis not present

## 2021-03-09 DIAGNOSIS — T8522XA Displacement of intraocular lens, initial encounter: Secondary | ICD-10-CM | POA: Diagnosis not present

## 2021-03-09 DIAGNOSIS — H2701 Aphakia, right eye: Secondary | ICD-10-CM | POA: Diagnosis not present

## 2021-03-09 DIAGNOSIS — H2710 Unspecified dislocation of lens: Secondary | ICD-10-CM | POA: Diagnosis not present

## 2021-03-09 DIAGNOSIS — E785 Hyperlipidemia, unspecified: Secondary | ICD-10-CM | POA: Diagnosis not present

## 2021-03-09 DIAGNOSIS — N182 Chronic kidney disease, stage 2 (mild): Secondary | ICD-10-CM | POA: Diagnosis not present

## 2021-03-09 DIAGNOSIS — I129 Hypertensive chronic kidney disease with stage 1 through stage 4 chronic kidney disease, or unspecified chronic kidney disease: Secondary | ICD-10-CM | POA: Diagnosis not present

## 2021-03-16 DIAGNOSIS — Z0000 Encounter for general adult medical examination without abnormal findings: Secondary | ICD-10-CM | POA: Diagnosis not present

## 2021-03-16 DIAGNOSIS — E039 Hypothyroidism, unspecified: Secondary | ICD-10-CM | POA: Diagnosis not present

## 2021-03-16 DIAGNOSIS — N1831 Chronic kidney disease, stage 3a: Secondary | ICD-10-CM | POA: Diagnosis not present

## 2021-03-16 DIAGNOSIS — I2510 Atherosclerotic heart disease of native coronary artery without angina pectoris: Secondary | ICD-10-CM | POA: Diagnosis not present

## 2021-03-16 DIAGNOSIS — F3340 Major depressive disorder, recurrent, in remission, unspecified: Secondary | ICD-10-CM | POA: Diagnosis not present

## 2021-03-16 DIAGNOSIS — M48061 Spinal stenosis, lumbar region without neurogenic claudication: Secondary | ICD-10-CM | POA: Diagnosis not present

## 2021-03-16 DIAGNOSIS — I700 Atherosclerosis of aorta: Secondary | ICD-10-CM | POA: Diagnosis not present

## 2021-03-16 DIAGNOSIS — E538 Deficiency of other specified B group vitamins: Secondary | ICD-10-CM | POA: Diagnosis not present

## 2021-03-16 DIAGNOSIS — E785 Hyperlipidemia, unspecified: Secondary | ICD-10-CM | POA: Diagnosis not present

## 2021-03-16 DIAGNOSIS — I10 Essential (primary) hypertension: Secondary | ICD-10-CM | POA: Diagnosis not present

## 2021-03-16 DIAGNOSIS — Z23 Encounter for immunization: Secondary | ICD-10-CM | POA: Diagnosis not present

## 2021-05-11 DIAGNOSIS — M75101 Unspecified rotator cuff tear or rupture of right shoulder, not specified as traumatic: Secondary | ICD-10-CM | POA: Diagnosis not present

## 2021-05-11 DIAGNOSIS — M75102 Unspecified rotator cuff tear or rupture of left shoulder, not specified as traumatic: Secondary | ICD-10-CM | POA: Diagnosis not present

## 2021-05-11 DIAGNOSIS — G5603 Carpal tunnel syndrome, bilateral upper limbs: Secondary | ICD-10-CM | POA: Diagnosis not present

## 2021-05-16 DIAGNOSIS — H27121 Anterior dislocation of lens, right eye: Secondary | ICD-10-CM | POA: Diagnosis not present

## 2021-05-18 ENCOUNTER — Encounter: Payer: Self-pay | Admitting: Podiatry

## 2021-05-18 ENCOUNTER — Ambulatory Visit: Payer: PPO | Admitting: Podiatry

## 2021-05-18 DIAGNOSIS — B351 Tinea unguium: Secondary | ICD-10-CM

## 2021-05-18 DIAGNOSIS — M79675 Pain in left toe(s): Secondary | ICD-10-CM | POA: Diagnosis not present

## 2021-05-18 DIAGNOSIS — M79674 Pain in right toe(s): Secondary | ICD-10-CM

## 2021-05-23 ENCOUNTER — Encounter: Payer: Self-pay | Admitting: Podiatry

## 2021-05-23 NOTE — Progress Notes (Signed)
  Subjective:  Patient ID: Jared Baldwin, male    DOB: 1945-05-09,  MRN: [id number]  Chief Complaint  Patient presents with   Nail Problem    Thick painful toenails    76 y.o. male presents with the above complaint. History confirmed with patient.   Objective:  Physical Exam: warm, good capillary refill, no trophic changes or ulcerative lesions, normal DP and PT pulses, and normal sensory exam. Left Foot: dystrophic yellowed discolored nail plates with subungual debris Right Foot: dystrophic yellowed discolored nail plates with subungual debris  Assessment:   1. Pain due to onychomycosis of toenails of both feet      Plan:  Patient was evaluated and treated and all questions answered.  Discussed the etiology and treatment options for the condition in detail with the patient. Educated patient on the topical and oral treatment options for mycotic nails. Recommended debridement of the nails today. Sharp and mechanical debridement performed of all painful and mycotic nails today. Nails debrided in length and thickness using a nail nipper to level of comfort. Discussed treatment options including appropriate shoe gear. Follow up as needed for painful nails.   Return in about 3 months (around 08/18/2021) for thick painful toenails.

## 2021-05-24 ENCOUNTER — Encounter (INDEPENDENT_AMBULATORY_CARE_PROVIDER_SITE_OTHER): Payer: PPO | Admitting: Ophthalmology

## 2021-06-09 DIAGNOSIS — G5603 Carpal tunnel syndrome, bilateral upper limbs: Secondary | ICD-10-CM | POA: Diagnosis not present

## 2021-07-18 DIAGNOSIS — H27121 Anterior dislocation of lens, right eye: Secondary | ICD-10-CM | POA: Diagnosis not present

## 2021-08-17 DIAGNOSIS — M75102 Unspecified rotator cuff tear or rupture of left shoulder, not specified as traumatic: Secondary | ICD-10-CM | POA: Diagnosis not present

## 2021-08-17 DIAGNOSIS — M75101 Unspecified rotator cuff tear or rupture of right shoulder, not specified as traumatic: Secondary | ICD-10-CM | POA: Diagnosis not present

## 2021-08-24 ENCOUNTER — Ambulatory Visit: Payer: PPO | Admitting: Podiatry

## 2021-08-24 ENCOUNTER — Encounter: Payer: Self-pay | Admitting: Podiatry

## 2021-08-24 DIAGNOSIS — M79674 Pain in right toe(s): Secondary | ICD-10-CM | POA: Diagnosis not present

## 2021-08-24 DIAGNOSIS — B351 Tinea unguium: Secondary | ICD-10-CM | POA: Diagnosis not present

## 2021-08-24 DIAGNOSIS — M79675 Pain in left toe(s): Secondary | ICD-10-CM

## 2021-08-25 ENCOUNTER — Ambulatory Visit: Payer: PPO | Admitting: Dermatology

## 2021-08-25 ENCOUNTER — Encounter: Payer: Self-pay | Admitting: Dermatology

## 2021-08-25 DIAGNOSIS — B029 Zoster without complications: Secondary | ICD-10-CM

## 2021-08-25 MED ORDER — GABAPENTIN 100 MG PO CAPS: 200.0000 mg | ORAL_CAPSULE | Freq: Three times a day (TID) | ORAL | 1 refills | Status: DC

## 2021-08-25 MED ORDER — VALACYCLOVIR HCL 1 G PO TABS: 1000.0000 mg | ORAL_TABLET | Freq: Three times a day (TID) | ORAL | 0 refills | Status: AC

## 2021-08-25 NOTE — Progress Notes (Addendum)
   Follow-Up Visit   Subjective  Jared Baldwin is a 76 y.o. male who presents for the following: Rash (Dur: 2-3 days. Rash on right side of body. Painful. Does not itch).  Wife with patient.   The following portions of the chart were reviewed this encounter and updated as appropriate:  Tobacco  Allergies  Meds  Problems  Med Hx  Surg Hx  Fam Hx      Review of Systems: No other skin or systemic complaints except as noted in HPI or Assessment and Plan.   Objective  Well appearing patient in no apparent distress; mood and affect are within normal limits.  A focused examination was performed including abdomen, flank, back. Relevant physical exam findings are noted in the Assessment and Plan.  Right Flank Erythematous patch with scattered vesicular papules in dermatomal presentation   Assessment & Plan  Herpes zoster without complication Right Flank  Start Valacyclovir 1gm 1 tablet 3 times daily with a large glass of water for 7 days. Dosed base on CrCl  Patient with significant discomfort.   Start Gabapentin 200 mg at bedtime tonight, increase to 400 mg tomorrow night and 600 mg the following night. He has taken in the past without significant side effects but does not it makes him sleepy. Counseled on sedation. Advised not to stop abruptly. Advised when ready to stop, he should decrease by one tablet daily until he stops.  valACYclovir (VALTREX) 1000 MG tablet - Right Flank Take 1 tablet (1,000 mg total) by mouth 3 (three) times daily for 7 days.  gabapentin (NEURONTIN) 100 MG capsule - Right Flank Take 2 capsules (200 mg total) by mouth 3 (three) times daily. Do not stop medicine abruptly. Decrease by one tablet daily to stop.   Return if symptoms worsen or fail to improve.  I, Lawson Radar, CMA, am acting as scribe for Darden Dates, MD.  Documentation: I have reviewed the above documentation for accuracy and completeness, and I agree with the above.  Darden Dates,  MD

## 2021-08-25 NOTE — Patient Instructions (Addendum)
Start Valacyclovir 1gm 1 tablet 3 times daily for 7 days  Start Gabapentin 200 mg at bedtime tonight, increase to 400 mg tomorrow night and 600 mg the following night  Do NOT take Tizanidine while taking Valacyclovir.   Shingles  Shingles is an infection. It gives you a painful skin rash and blisters that have fluid in them. Shingles is caused by the same germ (virus) that causes chickenpox. Shingles only happens in people who: Have had chickenpox. Have been given a shot (vaccine) to protect against chickenpox. Shingles is rare in this group. What are the causes? This condition is caused by varicella-zoster virus. This is the same germ that causes chickenpox. After a person is exposed to the germ, the germ stays in the body but is not active (dormant). Shingles develops if the germ becomes active again (is reactivated). This can happen many years after the first exposure to the germ. It is not known what causes this germ to become active again. What increases the risk? People who have had chickenpox or received the chickenpox shot are at risk for shingles. This infection is more common in people who: Are older than 76 years of age. Have a weakened disease-fighting system (immune system), such as people with: HIV (human immunodeficiency virus). AIDS (acquired immunodeficiency syndrome). Cancer. Are taking medicines that weaken the immune system, such as organ transplant medicines. Have a lot of stress. What are the signs or symptoms? The first symptoms of shingles may be itching, tingling, or pain in an area on your skin. A rash will show on your skin a few days or weeks later. This is what usually happens: The rash is likely to be on one side of your body. The rash usually has a shape like a belt or a band. Over time, the rash turns into fluid-filled blisters. The blisters will break open and change into scabs. The scabs usually dry up in about 2-3 weeks. You may also have: A  fever. Chills. A headache. A feeling like you may vomit (nausea). How is this treated? The rash may last for several weeks. There is not a specific cure for this condition. Your doctor may prescribe medicines. Medicines may: Help with pain. Help you get better sooner. Help to prevent long-term problems. Help with itching (antihistamines). If the area involved is on your face, you may need to see a specialist. This may be an eye doctor or an ear, nose, and throat (ENT) doctor. Follow these instructions at home: Medicines Take over-the-counter and prescription medicines only as told by your doctor. Put on an anti-itch cream or numbing cream where you have a rash, blisters, or scabs. Do this as told by your doctor. Helping with itching and discomfort  Put cold, wet cloths (cold compresses) on the area of the rash or blisters as told by your doctor. Cool baths can help you feel better. Try adding baking soda or dry oatmeal to the water to lessen itching. Do not bathe in hot water. Use calamine lotion as told by your doctor. Blister and rash care Keep your rash covered with a loose bandage (dressing). Wear loose clothing that does not rub on your rash. Wash your hands with soap and water for at least 20 seconds before and after you change your bandage. If you cannot use soap and water, use hand sanitizer. Change your bandage as told by your doctor. Keep your rash and blisters clean. To do this, wash the area with mild soap and cool water as  told by your doctor. Check your rash every day for signs of infection. Check for: More redness, swelling, or pain. Fluid or blood. Warmth. Pus or a bad smell. Do not scratch your rash. Do not pick at your blisters. To help you to not scratch: Keep your fingernails clean and cut short. Wear gloves or mittens when you sleep, if scratching is a problem. General instructions Rest as told by your doctor. Wash your hands often with soap and water for at  least 20 seconds. If you cannot use soap and water, use hand sanitizer. Doing this lowers your chance of getting a skin infection. Your infection can cause chickenpox in people who have never had chickenpox or never got a chickenpox vaccine shot. If you have blisters that did not change into scabs yet, try not to touch other people or be around other people, especially: Babies. Pregnant women. Children who have areas of red, itchy, or rough skin (eczema). Older people who have organ transplants. People who have a long-term (chronic) illness, like cancer or AIDS. Keep all follow-up visits. How is this prevented? A vaccine shot is the best way to prevent shingles and protect against shingles problems. If you have not had a vaccine shot, talk with your doctor about getting it. Where to find more information Centers for Disease Control and Prevention: FootballExhibition.com.br Contact a doctor if: Your pain does not get better with medicine. Your pain does not get better after the rash heals. You have any of these signs of infection around the rash: More redness, swelling, or pain. Fluid or blood. Warmth. Pus or a bad smell. You have a fever. Get help right away if: The rash is on your face or nose. You have pain in your face or pain by your eye. You lose feeling on one side of your face. You have trouble seeing. You have ear pain, or you have ringing in your ear. You have a loss of taste. Your condition gets worse. Summary Shingles gives you a painful skin rash and blisters that have fluid in them. Shingles is caused by the same germ (virus) that causes chickenpox. Keep your rash covered with a loose bandage. Wear loose clothing that does not rub on your rash. If you have blisters that did not change into scabs yet, try not to touch other people or be around people. This information is not intended to replace advice given to you by your health care provider. Make sure you discuss any questions you  have with your health care provider. Document Revised: 12/15/2019 Document Reviewed: 12/15/2019 Elsevier Patient Education  2023 [employer].   Due to recent changes in healthcare laws, you may see results of your pathology and/or laboratory studies on MyChart before the doctors have had a chance to review them. We understand that in some cases there may be results that are confusing or concerning to you. Please understand that not all results are received at the same time and often the doctors may need to interpret multiple results in order to provide you with the best plan of care or course of treatment. Therefore, we ask that you please give [place] 2 business days to thoroughly review all your results before contacting the office for clarification. Should we see a critical lab result, you will be contacted sooner.   If You Need Anything After Your Visit  If you have any questions or concerns for your doctor, please call our main line at [phone] and press option 4 to reach  your doctor's medical assistant. If no one answers, please leave a voicemail as directed and we will return your call as soon as possible. Messages left after 4 pm will be answered the following business day.   You may also send [place] a message via MyChart. We typically respond to MyChart messages within 1-2 business days.  For prescription refills, please ask your pharmacy to contact our office. Our fax number is [phone].  If you have an urgent issue when the clinic is closed that cannot wait until the next business day, you can page your doctor at the number below.    Please note that while we do our best to be available for urgent issues outside of office hours, we are not available 24/7.   If you have an urgent issue and are unable to reach [place], you may choose to seek medical care at your doctor's office, retail clinic, urgent care center, or emergency room.  If you have a medical emergency, please immediately call  911 or go to the emergency department.  Pager Numbers  - Dr. Gwen Pounds: [phone]  - Dr. Neale Burly: [phone]  - Dr. Roseanne Reno: [phone]  In the event of inclement weather, please call our main line at [phone] for an update on the status of any delays or closures.  Dermatology Medication Tips: Please keep the boxes that topical medications come in in order to help keep track of the instructions about where and how to use these. Pharmacies typically print the medication instructions only on the boxes and not directly on the medication tubes.   If your medication is too expensive, please contact our office at [phone] option 4 or send [place] a message through MyChart.   We are unable to tell what your co-pay for medications will be in advance as this is different depending on your insurance coverage. However, we may be able to find a substitute medication at lower cost or fill out paperwork to get insurance to cover a needed medication.   If a prior authorization is required to get your medication covered by your insurance company, please allow [place] 1-2 business days to complete this process.  Drug prices often vary depending on where the prescription is filled and some pharmacies may offer cheaper prices.  The website www.goodrx.com contains coupons for medications through different pharmacies. The prices here do not account for what the cost may be with help from insurance (it may be cheaper with your insurance), but the website can give you the price if you did not use any insurance.  - You can print the associated coupon and take it with your prescription to the pharmacy.  - You may also stop by our office during regular business hours and pick up a GoodRx coupon card.  - If you need your prescription sent electronically to a different pharmacy, notify our office through [facility] or by phone at [phone] option 4.     Si Usted Necesita Algo Despus de Su  Visita  Tambin puede enviarnos un mensaje a travs de Clinical cytogeneticist. Por lo general respondemos a los mensajes de MyChart en el transcurso de 1 a 2 das hbiles.  Para renovar recetas, por favor pida a su farmacia que se ponga en contacto con nuestra oficina. Annie Sable de fax es [place] [phone].  Si tiene un asunto urgente cuando la clnica est cerrada y que no puede esperar hasta el siguiente da hbil, puede llamar/localizar a su doctor(a) al nmero que aparece a continuacin.  Por favor, tenga en cuenta que aunque hacemos todo lo posible para estar disponibles para asuntos urgentes fuera del horario de [place], no estamos disponibles las 24 horas del da, los 7 [address]  [address] de la [place].   Si tiene un problema urgente y no puede comunicarse con nosotros, puede optar por buscar atencin mdica  en el consultorio de su doctor(a), en una clnica privada, en un centro de atencin urgente o en una sala de emergencias.  Si tiene Engineer, drilling, por favor llame inmediatamente al 911 o vaya a la sala de emergencias.  Nmeros de bper  - Dr. Gwen Pounds: [phone]  - Dra. Moye: [phone]  - Dra. Roseanne Reno: [phone]  En caso de inclemencias del [place], por favor llame a Lacy Duverney principal al [phone] para una actualizacin sobre el [place] de cualquier retraso o cierre.  Consejos para la medicacin en dermatologa: Por favor, guarde las cajas en las que vienen los medicamentos de uso tpico para ayudarle a seguir las instrucciones sobre dnde y cmo usarlos. Las farmacias generalmente imprimen las instrucciones del medicamento slo en las cajas y no directamente en los tubos del [place].   Si su medicamento es muy caro, por favor, pngase en contacto con Rolm Gala llamando al [phone] y presione la opcin 4 o envenos un mensaje a travs de Clinical cytogeneticist.   No podemos decirle cul ser su copago por los medicamentos por adelantado ya que esto es diferente dependiendo de la  cobertura de su seguro. Sin embargo, es posible que podamos encontrar un medicamento sustituto a Audiological scientist un formulario para que el seguro cubra el medicamento que se considera necesario.   Si se requiere una autorizacin previa para que su compaa de seguros [place] su medicamento, por favor permtanos de 1 a 2 das hbiles para completar [address]  Los precios de los medicamentos varan con frecuencia dependiendo del Environmental consultant de dnde se surte la receta y alguna farmacias pueden ofrecer precios ms baratos.  El sitio web www.goodrx.com tiene cupones para medicamentos de Health and safety inspector. Los precios aqu no tienen en cuenta lo que podra costar con la ayuda del seguro (puede ser ms barato con su seguro), pero el sitio web puede darle el precio si no utiliz Tourist information centre manager.  - Puede imprimir el cupn correspondiente y llevarlo con su receta a la farmacia.  - Tambin puede pasar por nuestra oficina durante el horario de atencin regular y Education officer, museum una tarjeta de cupones de GoodRx.  - Si necesita que su receta se enve electrnicamente a una farmacia diferente, informe a nuestra oficina a travs de MyChart de  o por telfono llamando al [phone] y presione la opcin 4.

## 2021-08-29 ENCOUNTER — Encounter: Payer: Self-pay | Admitting: Dermatology

## 2021-08-29 DIAGNOSIS — H40051 Ocular hypertension, right eye: Secondary | ICD-10-CM | POA: Diagnosis not present

## 2021-08-29 DIAGNOSIS — T8522XA Displacement of intraocular lens, initial encounter: Secondary | ICD-10-CM | POA: Diagnosis not present

## 2021-08-29 NOTE — Progress Notes (Signed)
  Subjective:  Patient ID: Jared Baldwin, male    DOB: 18-Jul-1945,  MRN: [id number]  Chief Complaint  Patient presents with   Nail Problem    "My nails"    76 y.o. male presents with the above complaint. History confirmed with patient.   Objective:  Physical Exam: warm, good capillary refill, no trophic changes or ulcerative lesions, normal DP and PT pulses, and normal sensory exam. Left Foot: dystrophic yellowed discolored nail plates with subungual debris Right Foot: dystrophic yellowed discolored nail plates with subungual debris  Assessment:   1. Pain due to onychomycosis of toenails of both feet       Plan:  Patient was evaluated and treated and all questions answered.  Discussed the etiology and treatment options for the condition in detail with the patient. Educated patient on the topical and oral treatment options for mycotic nails. Recommended debridement of the nails today. Sharp and mechanical debridement performed of all painful and mycotic nails today. Nails debrided in length and thickness using a nail nipper to level of comfort. Discussed treatment options including appropriate shoe gear. Follow up as needed for painful nails.   Return in about 9 weeks (around 10/26/2021) for painful nails .

## 2021-09-01 ENCOUNTER — Telehealth: Payer: Self-pay

## 2021-09-01 NOTE — Telephone Encounter (Signed)
This patients wife called and LVM on the nurse line stating that patient only has 1 pill of the Valacyclovir left for his Shingles. She states that he still has active blistering and oozing on rash that is located on his back. She would like to know if  this is normal for this point in his therapy or does he need another round of the medication? Please advise.

## 2021-09-01 NOTE — Telephone Encounter (Signed)
Advised patient's wife that he has been treated and the areas that are still oozing will continue to heal over the next 1-2 weeks/hd

## 2021-09-01 NOTE — Telephone Encounter (Signed)
Erroneous encounter - opened accidently

## 2021-09-01 NOTE — Telephone Encounter (Signed)
Please advise patient and wife that he has completed treatment to shut down the virus with the last pill and the oozing crusted areas should continue healing up over the next 1-2 weeks. Thank you!

## 2021-09-12 DIAGNOSIS — N1831 Chronic kidney disease, stage 3a: Secondary | ICD-10-CM | POA: Diagnosis not present

## 2021-09-12 DIAGNOSIS — E538 Deficiency of other specified B group vitamins: Secondary | ICD-10-CM | POA: Diagnosis not present

## 2021-09-12 DIAGNOSIS — E785 Hyperlipidemia, unspecified: Secondary | ICD-10-CM | POA: Diagnosis not present

## 2021-09-22 DIAGNOSIS — M48061 Spinal stenosis, lumbar region without neurogenic claudication: Secondary | ICD-10-CM | POA: Diagnosis not present

## 2021-09-22 DIAGNOSIS — I700 Atherosclerosis of aorta: Secondary | ICD-10-CM | POA: Diagnosis not present

## 2021-09-22 DIAGNOSIS — I2510 Atherosclerotic heart disease of native coronary artery without angina pectoris: Secondary | ICD-10-CM | POA: Diagnosis not present

## 2021-09-22 DIAGNOSIS — E538 Deficiency of other specified B group vitamins: Secondary | ICD-10-CM | POA: Diagnosis not present

## 2021-09-22 DIAGNOSIS — E039 Hypothyroidism, unspecified: Secondary | ICD-10-CM | POA: Diagnosis not present

## 2021-09-22 DIAGNOSIS — E785 Hyperlipidemia, unspecified: Secondary | ICD-10-CM | POA: Diagnosis not present

## 2021-09-22 DIAGNOSIS — F3340 Major depressive disorder, recurrent, in remission, unspecified: Secondary | ICD-10-CM | POA: Diagnosis not present

## 2021-09-22 DIAGNOSIS — I10 Essential (primary) hypertension: Secondary | ICD-10-CM | POA: Diagnosis not present

## 2021-09-22 DIAGNOSIS — N1831 Chronic kidney disease, stage 3a: Secondary | ICD-10-CM | POA: Diagnosis not present

## 2021-10-26 ENCOUNTER — Ambulatory Visit: Payer: PPO | Admitting: Podiatry

## 2021-10-26 DIAGNOSIS — B351 Tinea unguium: Secondary | ICD-10-CM | POA: Diagnosis not present

## 2021-10-26 DIAGNOSIS — M79675 Pain in left toe(s): Secondary | ICD-10-CM

## 2021-10-26 DIAGNOSIS — M79674 Pain in right toe(s): Secondary | ICD-10-CM

## 2021-10-31 NOTE — Progress Notes (Signed)
  Subjective:  Patient ID: Jared Baldwin, male    DOB: 12/26/1945,  MRN: [id number]  Chief Complaint  Patient presents with   Nail Problem    Thick painful toenails, 9 week follow up     76 y.o. male presents with the above complaint. History confirmed with patient.   Objective:  Physical Exam: warm, good capillary refill, no trophic changes or ulcerative lesions, normal DP and PT pulses, and normal sensory exam. Left Foot: dystrophic yellowed discolored nail plates with subungual debris Right Foot: dystrophic yellowed discolored nail plates with subungual debris  Assessment:   1. Pain due to onychomycosis of toenails of both feet       Plan:  Patient was evaluated and treated and all questions answered.  Discussed the etiology and treatment options for the condition in detail with the patient. Educated patient on the topical and oral treatment options for mycotic nails. Recommended debridement of the nails today. Sharp and mechanical debridement performed of all painful and mycotic nails today. Nails debrided in length and thickness using a nail nipper to level of comfort. Discussed treatment options including appropriate shoe gear. Follow up as needed for painful nails.   Return in about 9 weeks (around 12/28/2021) for painful nails .

## 2021-11-03 ENCOUNTER — Ambulatory Visit: Payer: PPO | Attending: Cardiovascular Disease | Admitting: Cardiovascular Disease

## 2021-11-03 ENCOUNTER — Encounter: Payer: Self-pay | Admitting: Cardiovascular Disease

## 2021-11-03 VITALS — BP 120/60 | HR 69 | Ht 67.0 in | Wt 235.0 lb

## 2021-11-03 DIAGNOSIS — I350 Nonrheumatic aortic (valve) stenosis: Secondary | ICD-10-CM

## 2021-11-03 DIAGNOSIS — I2510 Atherosclerotic heart disease of native coronary artery without angina pectoris: Secondary | ICD-10-CM | POA: Diagnosis not present

## 2021-11-03 DIAGNOSIS — I10 Essential (primary) hypertension: Secondary | ICD-10-CM | POA: Diagnosis not present

## 2021-11-03 DIAGNOSIS — E785 Hyperlipidemia, unspecified: Secondary | ICD-10-CM

## 2021-11-03 NOTE — Patient Instructions (Signed)
Medication Instructions:  Your physician recommends that you continue on your current medications as directed. Please refer to the Current Medication list given to you today.  *If you need a refill on your cardiac medications before your next appointment, please call your pharmacy*   Lab Work: None ordered If you have labs (blood work) drawn today and your tests are completely normal, you will receive your results only by: [place] (if you have MyChart) OR A paper copy in the mail If you have any lab test that is abnormal or we need to change your treatment, we will call you to review the results.   Testing/Procedures: Your physician has requested that you have an echocardiogram in 3 months. Echocardiography is a painless test that uses sound waves to create images of your heart. It provides your doctor with information about the size and shape of your heart and how well your heart's chambers and valves are working. This procedure takes approximately one hour. There are no restrictions for this procedure. Please do NOT wear cologne, perfume, aftershave, or lotions (deodorant is allowed). Please arrive 15 minutes prior to your appointment time.    Follow-Up: At [facility], you and your health needs are our priority.  As part of our continuing mission to provide you with exceptional heart care, we have created designated Provider Care Teams.  These Care Teams include your primary Cardiologist (physician) and Advanced Practice Providers (APPs -  Physician Assistants and Nurse Practitioners) who all work together to provide you with the care you need, when you need it.  We recommend signing up for the patient portal called "MyChart".  Sign up information is provided on this After Visit Summary.  MyChart is used to connect with patients for Virtual Visits (Telemedicine).  Patients are able to view lab/test results, encounter notes, upcoming appointments, etc.  Non-urgent messages  can be sent to your provider as well.   To learn more about what you can do with MyChart, go to [email or web address].    Your next appointment:   6 month(s)  The format for your next appointment:   In Person  Provider:   Kathlyn [place], MD   Important Information About [place]

## 2021-11-03 NOTE — Progress Notes (Signed)
Cardiology Office Note   Date:  11/03/2021   ID:  Jared Baldwin, Jared Baldwin 12/07/1945, MRN [id number]  PCP:  Lynnea Ferrier, MD  Cardiologist:   Lorine Bears, MD   Chief Complaint  Patient presents with   Other    6 month f/u no complaints today. Meds reviewed verbally with pt.      History of Present Illness: Jared Baldwin is a 76 y.o. male who is here today for follow-up visit regarding moderate two-vessel coronary artery disease and mild aortic stenosis.  He has chronic medical conditions including obesity status post gastric sleeve surgery, chronic back pain, GERD, hyperlipidemia, essential hypertension and hypothyroidism.  He underwent a right and left cardiac catheterization in November 2022 which showed moderate two-vessel coronary artery disease with moderate to severe calcifications especially in the proximal LAD.  The stenosis in the proximal LAD was interrogated with fractional flow reserve and was not significant with an IFR ratio of 0.93.  He was found to have mild aortic stenosis with peak gradient of 12 to 15 mmHg.  Right heart catheterization showed mildly elevated filling pressures, mild pulmonary hypertension and normal cardiac output. He has history of intolerance to potent statins.  Ezetimibe was added to pravastatin.  He has been doing well with no recent chest pain or worsening dyspnea.  His weight is stable.   Past Medical History:  Diagnosis Date   Aortic stenosis    a. 02/2012 Echo: EF 60-65%, very mild AS; b. 11/2020 Echo: EF 60-65%, GrI DD, no rwma, Nl RV size/fxn, RVSP 23.63mmHg, Mild AS/AI (AoV mean grad ; Vmax 2.6m/s).   Arthritis    CAD (coronary artery disease)    11/2020 Cath: LM 30ost/m, LAD 60p (nl iFR @ 0.93), 64m, 50d, LCX 56m, OM1/2 nl, OM3 min irregs, RCA 65p, 30d, RPDA 50. Mild AS w/ peak grad of 12-53mmHg. RHC w/ mild PAH. **Avoid future cath via R Radial due to signif prox R RA stenosis**   Chronic back pain    CKD (chronic kidney  disease), stage II    GERD (gastroesophageal reflux disease)    Hyperlipidemia    Hypertension    Hypothyroidism    Obesity    a. s/p gastric sleeve.    Past Surgical History:  Procedure Laterality Date   BACK SURGERY     CATARACT EXTRACTION W/ INTRAOCULAR LENS  IMPLANT, BILATERAL     CHOLECYSTECTOMY N/A 09/09/2016   Procedure: LAPAROSCOPIC CHOLECYSTECTOMY WITH INTRAOPERATIVE CHOLANGIOGRAM;  Surgeon: Henrene Dodge, MD;  Location: [facility];  Service: General;  Laterality: N/A;   CORONARY PRESSURE WIRE/FFR WITH 3D MAPPING N/A 11/05/2020   Procedure: Coronary Pressure Wire/FFR w/3D Mapping;  Surgeon: Iran Ouch, MD;  Location: [facility];  Service: Cardiovascular;  Laterality: N/A;   HERNIA REPAIR     KNEE ARTHROPLASTY Right 07/24/2016   Procedure: COMPUTER ASSISTED TOTAL KNEE ARTHROPLASTY;  Surgeon: Donato Heinz, MD;  Location: [facility];  Service: Orthopedics;  Laterality: Right;   RIGHT/LEFT HEART CATH AND CORONARY ANGIOGRAPHY Bilateral 11/05/2020   Procedure: RIGHT/LEFT HEART CATH AND CORONARY ANGIOGRAPHY;  Surgeon: Iran Ouch, MD;  Location: [facility];  Service: Cardiovascular;  Laterality: Bilateral;   TOTAL KNEE ARTHROPLASTY     UMBILICAL HERNIA REPAIR N/A 09/09/2016   Procedure: HERNIA REPAIR UMBILICAL ADULT;  Surgeon: Henrene Dodge, MD;  Location: [facility];  Service: General;  Laterality: N/A;     Current Outpatient Medications  Medication Sig Dispense Refill   aspirin  EC 81 MG tablet Take 81 mg by mouth daily. Swallow whole.     chlorthalidone (HYGROTON) 25 MG tablet Take 1 tablet (25 mg total) by mouth daily. 90 tablet 2   Cholecalciferol (VITAMIN D) 50 MCG (2000 UT) tablet Take 4,000 Units by mouth daily.     Cyanocobalamin 5000 MCG/ML LIQD Take 5,000 mcg by mouth daily.     escitalopram (LEXAPRO) 5 MG tablet Take 5 mg by mouth daily.     ezetimibe (ZETIA) 10 MG tablet Take 1 tablet (10 mg total) by mouth daily. 90 tablet 3   gabapentin  (NEURONTIN) 100 MG capsule Take 2 capsules (200 mg total) by mouth 3 (three) times daily. Do not stop medicine abruptly. Decrease by one tablet daily to stop. 180 capsule 1   levothyroxine (SYNTHROID, LEVOTHROID) 88 MCG tablet Take 88 mcg by mouth daily before breakfast.     losartan (COZAAR) 50 MG tablet TAKE 1 TABLET(50 MG) BY MOUTH DAILY 30 tablet 5   omeprazole (PRILOSEC) 40 MG capsule Take 40 mg by mouth daily.     pravastatin (PRAVACHOL) 80 MG tablet Take 80 mg by mouth daily.     tamsulosin (FLOMAX) 0.4 MG CAPS capsule Take 0.4 mg by mouth daily.     tiZANidine (ZANAFLEX) 4 MG tablet Take by mouth 3 (three) times daily as needed.     traZODone (DESYREL) 50 MG tablet Take 50 mg by mouth at bedtime.     TURMERIC CURCUMIN PO Take 800 mg by mouth daily.     No current facility-administered medications for this visit.    Allergies:   Lipitor [atorvastatin], Simvastatin, and Statins    Social History:  The patient  reports that he quit smoking about 24 years ago. His smoking use included cigarettes. He smoked an average of 2.00 packs per day. He has never used smokeless tobacco. He reports that he does not drink alcohol and does not use drugs.   Family History:  The patient's family history is negative for coronary artery disease.   ROS:  Please see the history of present illness.   Otherwise, review of systems are positive for none.   All other systems are reviewed and negative.    PHYSICAL EXAM: VS:  BP 120/60 (BP Location: Left Arm, Patient Position: Sitting, Cuff Size: Normal)   Pulse 69   Ht 5\' 7"  (1.702 m)   Wt 235 lb (106.6 kg)   SpO2 95%   BMI 36.81 kg/m  , BMI Body mass index is 36.81 kg/m. GEN: Well nourished, well developed, in no acute distress  HEENT: normal  Neck: no JVD, carotid bruits, or masses Cardiac: RRR; no  rubs, or gallops,no edema .  3 /6 crescendo decrescendo systolic murmur in the aortic area which is mid peaking.  S2 is diminished. Respiratory:  clear  to auscultation bilaterally, normal work of breathing GI: soft, nontender, nondistended, + BS MS: no deformity or atrophy  Skin: warm and dry, no rash Neuro:  Strength and sensation are intact Psych: euthymic mood, full affect   EKG:  EKG is ordered today. EKG showed normal sinus rhythm with low voltage and no significant ST or T wave changes.   Recent Labs: 12/10/2020: BUN 29; Creatinine, Ser 1.56; Potassium 3.6; Sodium 142    Lipid Panel No results found for: "CHOL", "TRIG", "HDL", "CHOLHDL", "VLDL", "LDLCALC", "LDLDIRECT"    Wt Readings from Last 3 Encounters:  11/03/21 235 lb (106.6 kg)  01/27/21 240 lb (108.9 kg)  11/24/20 234  lb 4 oz (106.3 kg)          10/26/2020    2:42 PM  PAD Screen  Previous PAD dx? No  Previous surgical procedure? No  Pain with walking? No  Feet/toe relief with dangling? No  Painful, non-healing ulcers? No  Extremities discolored? No      ASSESSMENT AND PLAN:  1.  Coronary artery disease involving native coronary arteries without angina: The patient has moderate two-vessel coronary artery disease with moderate severe calcifications.  No indication for revascularization at the present time.  Continue aspirin 81 mg daily.  2.  Essential hypertension: Blood pressure is well controlled on current medication.  3.  Hyperlipidemia: Currently on pravastatin 80 mg daily and ezetimibe 10 mg daily.  I reviewed most recent lipid profile done in September which showed an LDL of 58 which is at target.  4.  Aortic stenosis: This was mild to moderate last year.  I requested a follow-up echocardiogram to be done in 3 months from now.   Disposition: Follow-up in 6 months.   Signed,  Kathlyn Sacramento, MD  11/03/2021 4:54 PM    [place]

## 2021-11-27 ENCOUNTER — Other Ambulatory Visit: Payer: Self-pay | Admitting: Nurse Practitioner

## 2021-11-29 ENCOUNTER — Other Ambulatory Visit: Payer: Self-pay | Admitting: Nurse Practitioner

## 2021-11-30 DIAGNOSIS — M75101 Unspecified rotator cuff tear or rupture of right shoulder, not specified as traumatic: Secondary | ICD-10-CM | POA: Diagnosis not present

## 2021-11-30 DIAGNOSIS — M75102 Unspecified rotator cuff tear or rupture of left shoulder, not specified as traumatic: Secondary | ICD-10-CM | POA: Diagnosis not present

## 2021-12-02 DIAGNOSIS — J40 Bronchitis, not specified as acute or chronic: Secondary | ICD-10-CM | POA: Diagnosis not present

## 2021-12-14 DIAGNOSIS — R059 Cough, unspecified: Secondary | ICD-10-CM | POA: Diagnosis not present

## 2021-12-14 DIAGNOSIS — J209 Acute bronchitis, unspecified: Secondary | ICD-10-CM | POA: Diagnosis not present

## 2021-12-16 DIAGNOSIS — T8522XA Displacement of intraocular lens, initial encounter: Secondary | ICD-10-CM | POA: Diagnosis not present

## 2021-12-16 DIAGNOSIS — H35373 Puckering of macula, bilateral: Secondary | ICD-10-CM | POA: Diagnosis not present

## 2021-12-28 ENCOUNTER — Ambulatory Visit (INDEPENDENT_AMBULATORY_CARE_PROVIDER_SITE_OTHER): Payer: PPO | Admitting: Podiatry

## 2021-12-28 DIAGNOSIS — M79675 Pain in left toe(s): Secondary | ICD-10-CM

## 2021-12-28 DIAGNOSIS — B351 Tinea unguium: Secondary | ICD-10-CM | POA: Diagnosis not present

## 2021-12-28 DIAGNOSIS — M79674 Pain in right toe(s): Secondary | ICD-10-CM

## 2022-01-01 NOTE — Progress Notes (Signed)
  Subjective:  Patient ID: Jared Baldwin, male    DOB: 23-May-1945,  MRN: [id number]  Chief Complaint  Patient presents with   Nail Problem    Thick painful toenails, 3 month follow up    76 y.o. male presents with the above complaint. History confirmed with patient.   Objective:  Physical Exam: warm, good capillary refill, no trophic changes or ulcerative lesions, normal DP and PT pulses, and normal sensory exam. Left Foot: dystrophic yellowed discolored nail plates with subungual debris Right Foot: dystrophic yellowed discolored nail plates with subungual debris  Assessment:   1. Pain due to onychomycosis of toenails of both feet       Plan:  Patient was evaluated and treated and all questions answered.  Discussed the etiology and treatment options for the condition in detail with the patient. Educated patient on the topical and oral treatment options for mycotic nails. Recommended debridement of the nails today. Sharp and mechanical debridement performed of all painful and mycotic nails today. Nails debrided in length and thickness using a nail nipper to level of comfort. Discussed treatment options including appropriate shoe gear. Follow up as needed for painful nails.   Return in about 9 weeks (around 03/01/2022) for painful nails.

## 2022-01-30 DIAGNOSIS — H40051 Ocular hypertension, right eye: Secondary | ICD-10-CM | POA: Diagnosis not present

## 2022-01-30 DIAGNOSIS — T8522XA Displacement of intraocular lens, initial encounter: Secondary | ICD-10-CM | POA: Diagnosis not present

## 2022-02-03 ENCOUNTER — Ambulatory Visit: Payer: PPO | Attending: Cardiovascular Disease

## 2022-02-03 DIAGNOSIS — I350 Nonrheumatic aortic (valve) stenosis: Secondary | ICD-10-CM

## 2022-02-03 LAB — ECHOCARDIOGRAM COMPLETE
AR max vel: 2.19 cm2
AV Area VTI: 2.29 cm2
AV Area mean vel: 2.1 cm2
AV Mean grad: 16 mmHg
AV Peak grad: 29.8 mmHg
Ao pk vel: 2.73 m/s
Area-P 1/2: 2.82 cm2
MV VTI: 3.11 cm2
S' Lateral: 3.3 cm

## 2022-02-03 MED ORDER — PERFLUTREN LIPID MICROSPHERE
1.0000 mL | INTRAVENOUS | Status: AC | PRN
  Administered 2022-02-03: 2 mL via INTRAVENOUS

## 2022-02-15 ENCOUNTER — Encounter: Payer: Self-pay | Admitting: Podiatry

## 2022-02-20 DIAGNOSIS — H40051 Ocular hypertension, right eye: Secondary | ICD-10-CM | POA: Diagnosis not present

## 2022-02-27 DIAGNOSIS — H40051 Ocular hypertension, right eye: Secondary | ICD-10-CM | POA: Diagnosis not present

## 2022-03-01 DIAGNOSIS — I700 Atherosclerosis of aorta: Secondary | ICD-10-CM | POA: Diagnosis not present

## 2022-03-01 DIAGNOSIS — M75102 Unspecified rotator cuff tear or rupture of left shoulder, not specified as traumatic: Secondary | ICD-10-CM | POA: Diagnosis not present

## 2022-03-01 DIAGNOSIS — M75101 Unspecified rotator cuff tear or rupture of right shoulder, not specified as traumatic: Secondary | ICD-10-CM | POA: Diagnosis not present

## 2022-03-01 DIAGNOSIS — G5603 Carpal tunnel syndrome, bilateral upper limbs: Secondary | ICD-10-CM | POA: Diagnosis not present

## 2022-03-08 ENCOUNTER — Ambulatory Visit: Payer: PPO | Admitting: Podiatry

## 2022-03-13 ENCOUNTER — Ambulatory Visit: Payer: PPO | Admitting: Podiatry

## 2022-03-13 ENCOUNTER — Encounter: Payer: Self-pay | Admitting: Podiatry

## 2022-03-13 VITALS — BP 128/62 | HR 73

## 2022-03-13 DIAGNOSIS — M79675 Pain in left toe(s): Secondary | ICD-10-CM

## 2022-03-13 DIAGNOSIS — M79674 Pain in right toe(s): Secondary | ICD-10-CM | POA: Diagnosis not present

## 2022-03-13 DIAGNOSIS — B351 Tinea unguium: Secondary | ICD-10-CM

## 2022-03-14 NOTE — Progress Notes (Signed)
  Subjective:  Patient ID: Jared Baldwin, male    DOB: 22-Apr-1945,  MRN: [id number]  Chief Complaint  Patient presents with   Nail Problem    "Cut my nails."    77 y.o. male presents with the above complaint. History confirmed with patient.  Nails are thickened elongated causing discomfort again.  Previous debridements have been quite helpful.  Objective:  Physical Exam: warm, good capillary refill, no trophic changes or ulcerative lesions, normal DP and PT pulses, and normal sensory exam. Left Foot: dystrophic yellowed discolored nail plates with subungual debris Right Foot: dystrophic yellowed discolored nail plates with subungual debris  Assessment:   1. Pain due to onychomycosis of toenails of both feet       Plan:  Patient was evaluated and treated and all questions answered.  Discussed the etiology and treatment options for the condition in detail with the patient. Educated patient on the topical and oral treatment options for mycotic nails. Recommended debridement of the nails today. Sharp and mechanical debridement performed of all painful and mycotic nails today. Nails debrided in length and thickness using a nail nipper to level of comfort. Discussed treatment options including appropriate shoe gear. Follow up as needed for painful nails.   Return in about 9 weeks (around 05/15/2022) for painful thick fungal nails.

## 2022-03-17 DIAGNOSIS — Z2821 Immunization not carried out because of patient refusal: Secondary | ICD-10-CM | POA: Diagnosis not present

## 2022-03-17 DIAGNOSIS — Z23 Encounter for immunization: Secondary | ICD-10-CM | POA: Diagnosis not present

## 2022-03-17 DIAGNOSIS — I700 Atherosclerosis of aorta: Secondary | ICD-10-CM | POA: Diagnosis not present

## 2022-03-17 DIAGNOSIS — N1831 Chronic kidney disease, stage 3a: Secondary | ICD-10-CM | POA: Diagnosis not present

## 2022-03-17 DIAGNOSIS — F3340 Major depressive disorder, recurrent, in remission, unspecified: Secondary | ICD-10-CM | POA: Diagnosis not present

## 2022-03-17 DIAGNOSIS — M5090 Cervical disc disorder, unspecified, unspecified cervical region: Secondary | ICD-10-CM | POA: Diagnosis not present

## 2022-03-20 ENCOUNTER — Other Ambulatory Visit: Payer: Self-pay

## 2022-03-20 MED ORDER — LOSARTAN POTASSIUM 50 MG PO TABS: ORAL_TABLET | ORAL | 2 refills | Status: DC

## 2022-03-21 DIAGNOSIS — N1831 Chronic kidney disease, stage 3a: Secondary | ICD-10-CM | POA: Diagnosis not present

## 2022-03-21 DIAGNOSIS — E785 Hyperlipidemia, unspecified: Secondary | ICD-10-CM | POA: Diagnosis not present

## 2022-03-21 DIAGNOSIS — E039 Hypothyroidism, unspecified: Secondary | ICD-10-CM | POA: Diagnosis not present

## 2022-03-23 ENCOUNTER — Other Ambulatory Visit: Payer: Self-pay | Admitting: Internal Medicine

## 2022-03-23 DIAGNOSIS — M5412 Radiculopathy, cervical region: Secondary | ICD-10-CM

## 2022-03-29 DIAGNOSIS — I2510 Atherosclerotic heart disease of native coronary artery without angina pectoris: Secondary | ICD-10-CM | POA: Diagnosis not present

## 2022-03-29 DIAGNOSIS — I10 Essential (primary) hypertension: Secondary | ICD-10-CM | POA: Diagnosis not present

## 2022-03-29 DIAGNOSIS — M5090 Cervical disc disorder, unspecified, unspecified cervical region: Secondary | ICD-10-CM | POA: Diagnosis not present

## 2022-03-29 DIAGNOSIS — E538 Deficiency of other specified B group vitamins: Secondary | ICD-10-CM | POA: Diagnosis not present

## 2022-03-29 DIAGNOSIS — Z0000 Encounter for general adult medical examination without abnormal findings: Secondary | ICD-10-CM | POA: Diagnosis not present

## 2022-03-29 DIAGNOSIS — N1831 Chronic kidney disease, stage 3a: Secondary | ICD-10-CM | POA: Diagnosis not present

## 2022-03-29 DIAGNOSIS — I700 Atherosclerosis of aorta: Secondary | ICD-10-CM | POA: Diagnosis not present

## 2022-03-29 DIAGNOSIS — E039 Hypothyroidism, unspecified: Secondary | ICD-10-CM | POA: Diagnosis not present

## 2022-03-29 DIAGNOSIS — E785 Hyperlipidemia, unspecified: Secondary | ICD-10-CM | POA: Diagnosis not present

## 2022-03-29 DIAGNOSIS — F3340 Major depressive disorder, recurrent, in remission, unspecified: Secondary | ICD-10-CM | POA: Diagnosis not present

## 2022-03-29 DIAGNOSIS — K219 Gastro-esophageal reflux disease without esophagitis: Secondary | ICD-10-CM | POA: Diagnosis not present

## 2022-04-07 ENCOUNTER — Other Ambulatory Visit: Payer: PPO

## 2022-04-19 DIAGNOSIS — Z9889 Other specified postprocedural states: Secondary | ICD-10-CM | POA: Diagnosis not present

## 2022-04-19 DIAGNOSIS — H401131 Primary open-angle glaucoma, bilateral, mild stage: Secondary | ICD-10-CM | POA: Diagnosis not present

## 2022-05-03 DIAGNOSIS — H401131 Primary open-angle glaucoma, bilateral, mild stage: Secondary | ICD-10-CM | POA: Diagnosis not present

## 2022-05-04 ENCOUNTER — Ambulatory Visit: Payer: PPO | Admitting: Cardiovascular Disease

## 2022-05-15 ENCOUNTER — Ambulatory Visit: Payer: PPO | Admitting: Podiatry

## 2022-05-15 DIAGNOSIS — M79674 Pain in right toe(s): Secondary | ICD-10-CM

## 2022-05-15 DIAGNOSIS — B351 Tinea unguium: Secondary | ICD-10-CM | POA: Diagnosis not present

## 2022-05-15 DIAGNOSIS — M79675 Pain in left toe(s): Secondary | ICD-10-CM

## 2022-05-16 NOTE — Progress Notes (Signed)
  Subjective:  Patient ID: Jared Baldwin, male    DOB: April 05, 1945,  MRN: [id number]  Chief Complaint  Patient presents with   Nail Problem    Thick painful toenails, 2 month follow up    77 y.o. male presents with the above complaint. History confirmed with patient.  Nails are thickened elongated causing discomfort again.  Previous debridements have been quite helpful.  Objective:  Physical Exam: warm, good capillary refill, no trophic changes or ulcerative lesions, normal DP and PT pulses, and normal sensory exam. Left Foot: dystrophic yellowed discolored nail plates with subungual debris Right Foot: dystrophic yellowed discolored nail plates with subungual debris  Assessment:   1. Pain due to onychomycosis of toenails of both feet       Plan:  Patient was evaluated and treated and all questions answered.  Discussed the etiology and treatment options for the condition in detail with the patient.  Regular debridement has been helpful. Recommended debridement of the nails today. Sharp and mechanical debridement performed of all painful and mycotic nails today. Nails debrided in length and thickness using a nail nipper to level of comfort. Discussed treatment options including appropriate shoe gear. Follow up as needed for painful nails.   Return in about 10 weeks (around 07/24/2022) for painful thick fungal nails.

## 2022-05-18 ENCOUNTER — Other Ambulatory Visit: Payer: Self-pay | Admitting: Cardiovascular Disease

## 2022-05-18 ENCOUNTER — Other Ambulatory Visit: Payer: Self-pay | Admitting: Nurse Practitioner

## 2022-05-18 ENCOUNTER — Other Ambulatory Visit: Payer: Self-pay | Admitting: Dermatology

## 2022-05-18 DIAGNOSIS — B029 Zoster without complications: Secondary | ICD-10-CM

## 2022-05-18 NOTE — Telephone Encounter (Signed)
Patient states that he is still having pain and would like refills. Advised not to stop medication suddenly.

## 2022-05-18 NOTE — Telephone Encounter (Signed)
Please call patient to schedule appt for refill.  Thanks!

## 2022-05-18 NOTE — Telephone Encounter (Signed)
Please check if he is still taking this. If so, confirm he is doing okay and we will refill. If he is not having pain, I would recommend taking him off of this, tapering slow. If he still needs it and is doing well, we can continue for longer. He should not stop taking this suddenly, it is important to taper off slowly. Thank you!

## 2022-05-18 NOTE — Telephone Encounter (Signed)
last visit 11/03/2021 --Follow-up in 6 months.  next visit none.   05/04/22 cx reason: Conflict/Need to Reschedule ([place]. Will call back to reschedule)

## 2022-05-31 DIAGNOSIS — M75101 Unspecified rotator cuff tear or rupture of right shoulder, not specified as traumatic: Secondary | ICD-10-CM | POA: Diagnosis not present

## 2022-05-31 DIAGNOSIS — M19012 Primary osteoarthritis, left shoulder: Secondary | ICD-10-CM | POA: Diagnosis not present

## 2022-05-31 DIAGNOSIS — M75102 Unspecified rotator cuff tear or rupture of left shoulder, not specified as traumatic: Secondary | ICD-10-CM | POA: Diagnosis not present

## 2022-05-31 DIAGNOSIS — H401131 Primary open-angle glaucoma, bilateral, mild stage: Secondary | ICD-10-CM | POA: Diagnosis not present

## 2022-07-18 DIAGNOSIS — S66110A Strain of flexor muscle, fascia and tendon of right index finger at wrist and hand level, initial encounter: Secondary | ICD-10-CM | POA: Diagnosis not present

## 2022-07-18 DIAGNOSIS — M25531 Pain in right wrist: Secondary | ICD-10-CM | POA: Diagnosis not present

## 2022-07-26 ENCOUNTER — Ambulatory Visit: Payer: PPO | Admitting: Podiatry

## 2022-07-26 ENCOUNTER — Encounter: Payer: Self-pay | Admitting: Podiatry

## 2022-07-26 VITALS — BP 123/56

## 2022-07-26 DIAGNOSIS — B351 Tinea unguium: Secondary | ICD-10-CM

## 2022-07-26 DIAGNOSIS — M79674 Pain in right toe(s): Secondary | ICD-10-CM

## 2022-07-26 DIAGNOSIS — M79675 Pain in left toe(s): Secondary | ICD-10-CM | POA: Diagnosis not present

## 2022-07-26 NOTE — Progress Notes (Signed)
  Subjective:  Patient ID: Jared Baldwin, male    DOB: September 03, 1945,  MRN: [id number]  Chief Complaint  Patient presents with   Nail Problem    "Cut my nails."    77 y.o. male presents with the above complaint. History confirmed with patient.  Nails are thickened elongated causing discomfort again.  Previous debridements have been quite helpful.  Objective:  Physical Exam: warm, good capillary refill, no trophic changes or ulcerative lesions, normal DP and PT pulses, and normal sensory exam. Left Foot: dystrophic yellowed discolored nail plates with subungual debris Right Foot: dystrophic yellowed discolored nail plates with subungual debris  Assessment:   1. Pain due to onychomycosis of toenails of both feet       Plan:  Patient was evaluated and treated and all questions answered.  Discussed the etiology and treatment options for the condition in detail with the patient.  Regular debridement has been helpful. Recommended debridement of the nails today. Sharp and mechanical debridement performed of all painful and mycotic nails today. Nails debrided in length and thickness using a nail nipper to level of comfort. Discussed treatment options including appropriate shoe gear. Follow up as needed for painful nails.   Return in about 10 weeks (around 10/04/2022) for painful thick fungal nails.

## 2022-08-24 DIAGNOSIS — E039 Hypothyroidism, unspecified: Secondary | ICD-10-CM | POA: Diagnosis not present

## 2022-08-24 DIAGNOSIS — I10 Essential (primary) hypertension: Secondary | ICD-10-CM | POA: Diagnosis not present

## 2022-08-24 DIAGNOSIS — E785 Hyperlipidemia, unspecified: Secondary | ICD-10-CM | POA: Diagnosis not present

## 2022-08-24 DIAGNOSIS — M48061 Spinal stenosis, lumbar region without neurogenic claudication: Secondary | ICD-10-CM | POA: Diagnosis not present

## 2022-08-24 DIAGNOSIS — I700 Atherosclerosis of aorta: Secondary | ICD-10-CM | POA: Diagnosis not present

## 2022-08-24 DIAGNOSIS — N2581 Secondary hyperparathyroidism of renal origin: Secondary | ICD-10-CM | POA: Diagnosis not present

## 2022-08-24 DIAGNOSIS — M25512 Pain in left shoulder: Secondary | ICD-10-CM | POA: Diagnosis not present

## 2022-08-24 DIAGNOSIS — G8929 Other chronic pain: Secondary | ICD-10-CM | POA: Diagnosis not present

## 2022-08-24 DIAGNOSIS — F3340 Major depressive disorder, recurrent, in remission, unspecified: Secondary | ICD-10-CM | POA: Diagnosis not present

## 2022-08-24 DIAGNOSIS — E538 Deficiency of other specified B group vitamins: Secondary | ICD-10-CM | POA: Diagnosis not present

## 2022-08-24 DIAGNOSIS — M5090 Cervical disc disorder, unspecified, unspecified cervical region: Secondary | ICD-10-CM | POA: Diagnosis not present

## 2022-08-24 DIAGNOSIS — N1831 Chronic kidney disease, stage 3a: Secondary | ICD-10-CM | POA: Diagnosis not present

## 2022-08-24 DIAGNOSIS — M19012 Primary osteoarthritis, left shoulder: Secondary | ICD-10-CM | POA: Diagnosis not present

## 2022-08-24 DIAGNOSIS — I2510 Atherosclerotic heart disease of native coronary artery without angina pectoris: Secondary | ICD-10-CM | POA: Diagnosis not present

## 2022-09-06 DIAGNOSIS — M75101 Unspecified rotator cuff tear or rupture of right shoulder, not specified as traumatic: Secondary | ICD-10-CM | POA: Diagnosis not present

## 2022-09-06 DIAGNOSIS — M75102 Unspecified rotator cuff tear or rupture of left shoulder, not specified as traumatic: Secondary | ICD-10-CM | POA: Diagnosis not present

## 2022-09-06 DIAGNOSIS — M19012 Primary osteoarthritis, left shoulder: Secondary | ICD-10-CM | POA: Diagnosis not present

## 2022-09-13 DIAGNOSIS — H401131 Primary open-angle glaucoma, bilateral, mild stage: Secondary | ICD-10-CM | POA: Diagnosis not present

## 2022-10-04 ENCOUNTER — Ambulatory Visit: Payer: PPO | Admitting: Podiatry

## 2022-10-04 DIAGNOSIS — I10 Essential (primary) hypertension: Secondary | ICD-10-CM | POA: Diagnosis not present

## 2022-10-04 DIAGNOSIS — E785 Hyperlipidemia, unspecified: Secondary | ICD-10-CM | POA: Diagnosis not present

## 2022-10-04 DIAGNOSIS — Z2821 Immunization not carried out because of patient refusal: Secondary | ICD-10-CM | POA: Diagnosis not present

## 2022-10-04 DIAGNOSIS — F3340 Major depressive disorder, recurrent, in remission, unspecified: Secondary | ICD-10-CM | POA: Diagnosis not present

## 2022-10-04 DIAGNOSIS — R7303 Prediabetes: Secondary | ICD-10-CM | POA: Diagnosis not present

## 2022-10-04 DIAGNOSIS — M2550 Pain in unspecified joint: Secondary | ICD-10-CM | POA: Diagnosis not present

## 2022-10-04 DIAGNOSIS — I700 Atherosclerosis of aorta: Secondary | ICD-10-CM | POA: Diagnosis not present

## 2022-10-04 DIAGNOSIS — E039 Hypothyroidism, unspecified: Secondary | ICD-10-CM | POA: Diagnosis not present

## 2022-10-04 DIAGNOSIS — N1831 Chronic kidney disease, stage 3a: Secondary | ICD-10-CM | POA: Diagnosis not present

## 2022-10-04 DIAGNOSIS — R270 Ataxia, unspecified: Secondary | ICD-10-CM | POA: Diagnosis not present

## 2022-10-04 DIAGNOSIS — H401131 Primary open-angle glaucoma, bilateral, mild stage: Secondary | ICD-10-CM | POA: Diagnosis not present

## 2022-10-04 DIAGNOSIS — M48061 Spinal stenosis, lumbar region without neurogenic claudication: Secondary | ICD-10-CM | POA: Diagnosis not present

## 2022-10-06 ENCOUNTER — Other Ambulatory Visit: Payer: Self-pay | Admitting: Internal Medicine

## 2022-10-06 DIAGNOSIS — R270 Ataxia, unspecified: Secondary | ICD-10-CM

## 2022-10-06 DIAGNOSIS — M48061 Spinal stenosis, lumbar region without neurogenic claudication: Secondary | ICD-10-CM

## 2022-10-13 DIAGNOSIS — R748 Abnormal levels of other serum enzymes: Secondary | ICD-10-CM | POA: Diagnosis not present

## 2022-10-18 ENCOUNTER — Encounter: Payer: Self-pay | Admitting: Podiatry

## 2022-10-18 ENCOUNTER — Ambulatory Visit: Payer: PPO | Admitting: Podiatry

## 2022-10-18 VITALS — BP 144/57 | HR 70

## 2022-10-18 DIAGNOSIS — M79675 Pain in left toe(s): Secondary | ICD-10-CM

## 2022-10-18 DIAGNOSIS — B351 Tinea unguium: Secondary | ICD-10-CM | POA: Diagnosis not present

## 2022-10-18 DIAGNOSIS — M79674 Pain in right toe(s): Secondary | ICD-10-CM | POA: Diagnosis not present

## 2022-10-18 NOTE — Progress Notes (Signed)
Subjective:  Patient ID: Jared Baldwin, male    DOB: 01/12/1945,  MRN: [id number]  Chief Complaint  Patient presents with   Nail Problem    "Cut my nails."    77 y.o. male presents with the above complaint. History confirmed with patient.  Nails are thickened elongated causing discomfort again.  Previous debridements have been quite helpful.  Objective:  Physical Exam: warm, good capillary refill, no trophic changes or ulcerative lesions, normal DP and PT pulses, and normal sensory exam. Left Foot: dystrophic yellowed discolored nail plates with subungual debris Right Foot: dystrophic yellowed discolored nail plates with subungual debris  Assessment:   1. Pain due to onychomycosis of toenails of both feet       Plan:  Patient was evaluated and treated and all questions answered.  Discussed the etiology and treatment options for the condition in detail with the patient.  Regular debridement has been helpful. Recommended debridement of the nails today. Sharp and mechanical debridement performed of all painful and mycotic nails today. Nails debrided in length and thickness using a nail nipper to level of comfort. Discussed treatment options including appropriate shoe gear. Follow up as needed for painful nails.   Return in about 9 weeks (around 12/20/2022) for painful thick fungal nails.

## 2022-10-23 DIAGNOSIS — R748 Abnormal levels of other serum enzymes: Secondary | ICD-10-CM | POA: Diagnosis not present

## 2022-10-27 DIAGNOSIS — M879 Osteonecrosis, unspecified: Secondary | ICD-10-CM | POA: Diagnosis not present

## 2022-10-27 DIAGNOSIS — M7582 Other shoulder lesions, left shoulder: Secondary | ICD-10-CM | POA: Diagnosis not present

## 2022-10-27 DIAGNOSIS — M19212 Secondary osteoarthritis, left shoulder: Secondary | ICD-10-CM | POA: Diagnosis not present

## 2022-10-27 DIAGNOSIS — M7581 Other shoulder lesions, right shoulder: Secondary | ICD-10-CM | POA: Diagnosis not present

## 2022-11-03 DIAGNOSIS — R3589 Other polyuria: Secondary | ICD-10-CM | POA: Diagnosis not present

## 2022-11-03 DIAGNOSIS — R350 Frequency of micturition: Secondary | ICD-10-CM | POA: Diagnosis not present

## 2022-11-03 DIAGNOSIS — R748 Abnormal levels of other serum enzymes: Secondary | ICD-10-CM | POA: Diagnosis not present

## 2022-11-23 DIAGNOSIS — I700 Atherosclerosis of aorta: Secondary | ICD-10-CM | POA: Diagnosis not present

## 2022-11-23 DIAGNOSIS — N1831 Chronic kidney disease, stage 3a: Secondary | ICD-10-CM | POA: Diagnosis not present

## 2022-11-23 DIAGNOSIS — I2510 Atherosclerotic heart disease of native coronary artery without angina pectoris: Secondary | ICD-10-CM | POA: Diagnosis not present

## 2022-11-23 DIAGNOSIS — E785 Hyperlipidemia, unspecified: Secondary | ICD-10-CM | POA: Diagnosis not present

## 2022-11-23 DIAGNOSIS — M879 Osteonecrosis, unspecified: Secondary | ICD-10-CM | POA: Diagnosis not present

## 2022-11-23 DIAGNOSIS — M609 Myositis, unspecified: Secondary | ICD-10-CM | POA: Diagnosis not present

## 2022-11-23 DIAGNOSIS — K219 Gastro-esophageal reflux disease without esophagitis: Secondary | ICD-10-CM | POA: Diagnosis not present

## 2022-11-23 DIAGNOSIS — I10 Essential (primary) hypertension: Secondary | ICD-10-CM | POA: Diagnosis not present

## 2022-11-23 DIAGNOSIS — E039 Hypothyroidism, unspecified: Secondary | ICD-10-CM | POA: Diagnosis not present

## 2022-11-23 DIAGNOSIS — R7303 Prediabetes: Secondary | ICD-10-CM | POA: Diagnosis not present

## 2022-11-23 DIAGNOSIS — E538 Deficiency of other specified B group vitamins: Secondary | ICD-10-CM | POA: Diagnosis not present

## 2022-11-23 DIAGNOSIS — Z23 Encounter for immunization: Secondary | ICD-10-CM | POA: Diagnosis not present

## 2022-11-23 DIAGNOSIS — F3340 Major depressive disorder, recurrent, in remission, unspecified: Secondary | ICD-10-CM | POA: Diagnosis not present

## 2022-11-23 DIAGNOSIS — R748 Abnormal levels of other serum enzymes: Secondary | ICD-10-CM | POA: Diagnosis not present

## 2022-12-11 DIAGNOSIS — M130 Polyarthritis, unspecified: Secondary | ICD-10-CM | POA: Diagnosis not present

## 2022-12-11 DIAGNOSIS — R700 Elevated erythrocyte sedimentation rate: Secondary | ICD-10-CM | POA: Diagnosis not present

## 2022-12-11 DIAGNOSIS — N1831 Chronic kidney disease, stage 3a: Secondary | ICD-10-CM | POA: Diagnosis not present

## 2022-12-11 DIAGNOSIS — F3340 Major depressive disorder, recurrent, in remission, unspecified: Secondary | ICD-10-CM | POA: Diagnosis not present

## 2022-12-11 DIAGNOSIS — R748 Abnormal levels of other serum enzymes: Secondary | ICD-10-CM | POA: Diagnosis not present

## 2022-12-11 DIAGNOSIS — I700 Atherosclerosis of aorta: Secondary | ICD-10-CM | POA: Diagnosis not present

## 2022-12-11 DIAGNOSIS — I10 Essential (primary) hypertension: Secondary | ICD-10-CM | POA: Diagnosis not present

## 2022-12-11 DIAGNOSIS — R7303 Prediabetes: Secondary | ICD-10-CM | POA: Diagnosis not present

## 2022-12-20 ENCOUNTER — Encounter: Payer: Self-pay | Admitting: Podiatry

## 2022-12-20 ENCOUNTER — Ambulatory Visit: Payer: PPO | Admitting: Podiatry

## 2022-12-20 DIAGNOSIS — M79675 Pain in left toe(s): Secondary | ICD-10-CM

## 2022-12-20 DIAGNOSIS — B351 Tinea unguium: Secondary | ICD-10-CM

## 2022-12-20 DIAGNOSIS — M79674 Pain in right toe(s): Secondary | ICD-10-CM

## 2022-12-24 NOTE — Progress Notes (Signed)
  Subjective:  Patient ID: Jared Baldwin, male    DOB: [birth year],  MRN: [id number]  Chief Complaint  Patient presents with   Nail Problem    "Cut my nails."    77 y.o. male presents with the above complaint. History confirmed with patient.  Nails are thickened elongated causing discomfort again.  Previous debridements have been quite helpful.  Objective:  Physical Exam: warm, good capillary refill, no trophic changes or ulcerative lesions, normal DP and PT pulses, and normal sensory exam. Left Foot: dystrophic yellowed discolored nail plates with subungual debris Right Foot: dystrophic yellowed discolored nail plates with subungual debris  Assessment:   1. Pain due to onychomycosis of toenails of both feet       Plan:  Patient was evaluated and treated and all questions answered.  Discussed the etiology and treatment options for the condition in detail with the patient.  Regular debridement has been helpful. Recommended debridement of the nails today. Sharp and mechanical debridement performed of all painful and mycotic nails today. Nails debrided in length and thickness using a nail nipper and rotary bur to level of comfort. Discussed treatment options including appropriate shoe gear. Follow up as needed for painful nails.   Return in about 10 weeks (around 02/28/2023) for painful thick fungal nails.

## 2023-01-01 ENCOUNTER — Encounter: Payer: Self-pay | Admitting: Urology

## 2023-01-01 ENCOUNTER — Ambulatory Visit: Payer: PPO | Admitting: Urology

## 2023-01-01 VITALS — BP 143/72 | HR 66 | Ht 72.0 in | Wt 235.0 lb

## 2023-01-01 DIAGNOSIS — N400 Enlarged prostate without lower urinary tract symptoms: Secondary | ICD-10-CM | POA: Diagnosis not present

## 2023-01-01 DIAGNOSIS — N401 Enlarged prostate with lower urinary tract symptoms: Secondary | ICD-10-CM | POA: Diagnosis not present

## 2023-01-01 DIAGNOSIS — N4889 Other specified disorders of penis: Secondary | ICD-10-CM | POA: Diagnosis not present

## 2023-01-01 MED ORDER — GEMTESA 75 MG PO TABS: 75.0000 mg | ORAL_TABLET | Freq: Every day | ORAL | Status: DC

## 2023-01-01 MED ORDER — TADALAFIL 5 MG PO TABS: 5.0000 mg | ORAL_TABLET | Freq: Every day | ORAL | 3 refills | Status: AC

## 2023-01-02 ENCOUNTER — Encounter: Payer: Self-pay | Admitting: Urology

## 2023-01-02 LAB — URINALYSIS, COMPLETE
Bilirubin, UA: NEGATIVE
Glucose, UA: NEGATIVE
Ketones, UA: NEGATIVE
Leukocytes,UA: NEGATIVE
Nitrite, UA: NEGATIVE
RBC, UA: NEGATIVE
Specific Gravity, UA: >1.03 — ABNORMAL HIGH (ref 1.005–1.030)
Urobilinogen, Ur: 0.2 mg/dL (ref 0.2–1.0)
pH, UA: 5.5 (ref 5.0–7.5)

## 2023-01-02 LAB — MICROSCOPIC EXAMINATION

## 2023-01-10 DIAGNOSIS — H401131 Primary open-angle glaucoma, bilateral, mild stage: Secondary | ICD-10-CM | POA: Diagnosis not present

## 2023-01-31 DIAGNOSIS — R748 Abnormal levels of other serum enzymes: Secondary | ICD-10-CM | POA: Diagnosis not present

## 2023-01-31 DIAGNOSIS — M51362 Other intervertebral disc degeneration, lumbar region with discogenic back pain and lower extremity pain: Secondary | ICD-10-CM | POA: Diagnosis not present

## 2023-01-31 DIAGNOSIS — M5090 Cervical disc disorder, unspecified, unspecified cervical region: Secondary | ICD-10-CM | POA: Diagnosis not present

## 2023-01-31 DIAGNOSIS — M150 Primary generalized (osteo)arthritis: Secondary | ICD-10-CM | POA: Diagnosis not present

## 2023-02-01 ENCOUNTER — Encounter: Payer: Self-pay | Admitting: Physician Assistant

## 2023-02-01 ENCOUNTER — Ambulatory Visit: Payer: PPO | Admitting: Physician Assistant

## 2023-02-01 ENCOUNTER — Telehealth: Payer: Self-pay | Admitting: Urology

## 2023-02-01 VITALS — BP 151/67 | HR 76 | Ht 72.0 in | Wt 235.0 lb

## 2023-02-01 DIAGNOSIS — N400 Enlarged prostate without lower urinary tract symptoms: Secondary | ICD-10-CM

## 2023-02-01 DIAGNOSIS — R351 Nocturia: Secondary | ICD-10-CM | POA: Diagnosis not present

## 2023-02-01 DIAGNOSIS — N401 Enlarged prostate with lower urinary tract symptoms: Secondary | ICD-10-CM | POA: Diagnosis not present

## 2023-02-01 LAB — BLADDER SCAN AMB NON-IMAGING: Scan Result: 24

## 2023-02-01 MED ORDER — GEMTESA 75 MG PO TABS: 75.0000 mg | ORAL_TABLET | Freq: Every day | ORAL | 11 refills | Status: DC

## 2023-02-01 NOTE — Telephone Encounter (Signed)
Pt's wife called stating that RX for Leslye Peer is going to cost pt $200 and is very expensive for him. Wife is wanting to know if there is a similar medication that works just as well that pt can try or she is wanting to know if pt can get samples of the [place]. Please advise patient.

## 2023-02-01 NOTE — Progress Notes (Signed)
02/01/2023 3:29 PM   Jared Baldwin May 11, 1945 [id number]  CC: Chief Complaint  Patient presents with   Benign Prostatic Hypertrophy   HPI: Jared Baldwin is a 78 y.o. male with PMH BPH on Flomax, OSA, and nocturia who presents today for symptom recheck on Gemtesa and CPAP.   Today he reports he is not using CPAP. The components do not work well for him. He is very pleased with Jared Baldwin and wishes to continue it. He describes daytime frequency x2-3 and nocturia x1-2. PVR 24mL.  PMH: Past Medical History:  Diagnosis Date   Aortic stenosis    a. 02/2012 Echo: EF 60-65%, very mild AS; b. 11/2020 Echo: EF 60-65%, GrI DD, no rwma, Nl RV size/fxn, RVSP 23.46mmHg, Mild AS/AI (AoV mean grad ; Vmax 2.95m/s).   Arthritis    CAD (coronary artery disease)    11/2020 Cath: LM 30ost/m, LAD 60p (nl iFR @ 0.93), 48m, 50d, LCX 42m, OM1/2 nl, OM3 min irregs, RCA 65p, 30d, RPDA 50. Mild AS w/ peak grad of 12-61mmHg. RHC w/ mild PAH. **Avoid future cath via R Radial due to signif prox R RA stenosis**   Chronic back pain    CKD (chronic kidney disease), stage II    GERD (gastroesophageal reflux disease)    Hyperlipidemia    Hypertension    Hypothyroidism    Obesity    a. s/p gastric sleeve.    Surgical History: Past Surgical History:  Procedure Laterality Date   BACK SURGERY     CATARACT EXTRACTION W/ INTRAOCULAR LENS  IMPLANT, BILATERAL     CHOLECYSTECTOMY N/A 09/09/2016   Procedure: LAPAROSCOPIC CHOLECYSTECTOMY WITH INTRAOPERATIVE CHOLANGIOGRAM;  Surgeon: Henrene Dodge, MD;  Location: [facility];  Service: General;  Laterality: N/A;   CORONARY PRESSURE/FFR WITH 3D MAPPING N/A 11/05/2020   Procedure: Coronary Pressure Wire/FFR w/3D Mapping;  Surgeon: Iran Ouch, MD;  Location: [facility];  Service: Cardiovascular;  Laterality: N/A;   HERNIA REPAIR     KNEE ARTHROPLASTY Right 07/24/2016   Procedure: COMPUTER ASSISTED TOTAL KNEE ARTHROPLASTY;  Surgeon: Donato Heinz, MD;   Location: [facility];  Service: Orthopedics;  Laterality: Right;   RIGHT/LEFT HEART CATH AND CORONARY ANGIOGRAPHY Bilateral 11/05/2020   Procedure: RIGHT/LEFT HEART CATH AND CORONARY ANGIOGRAPHY;  Surgeon: Iran Ouch, MD;  Location: [facility];  Service: Cardiovascular;  Laterality: Bilateral;   TOTAL KNEE ARTHROPLASTY     UMBILICAL HERNIA REPAIR N/A 09/09/2016   Procedure: HERNIA REPAIR UMBILICAL ADULT;  Surgeon: Henrene Dodge, MD;  Location: [facility];  Service: General;  Laterality: N/A;    Home Medications:  Allergies as of 02/01/2023       Reactions   Lipitor [atorvastatin]    Myalgias   Simvastatin    Statins Other (See Comments)   Muscle pain with atorvastatin and simvastatin        Medication List        Accurate as of February 01, 2023  3:29 PM. If you have any questions, ask your nurse or doctor.          aspirin EC 81 MG tablet Take 81 mg by mouth daily. Swallow whole.   chlorthalidone 25 MG tablet Commonly known as: HYGROTON Take 1 tablet (25 mg total) by mouth daily.   Cyanocobalamin 5000 MCG/ML Liqd Take 5,000 mcg by mouth daily.   escitalopram 5 MG tablet Commonly known as: LEXAPRO Take 5 mg by mouth daily.   ezetimibe 10 MG tablet Commonly known as: ZETIA  TAKE 1 TABLET(10 MG) BY MOUTH DAILY   gabapentin 100 MG capsule Commonly known as: NEURONTIN TAKE 2 CAPSULES(200 MG) BY MOUTH THREE TIMES DAILY. DO NOT. STOP MEDICINE ABRUPTLY. DECREASE BY 1 TABLET DAILY TO. STOP   Gemtesa 75 MG Tabs Generic drug: Vibegron Take 1 tablet (75 mg total) by mouth daily.   levothyroxine 88 MCG tablet Commonly known as: SYNTHROID Take 88 mcg by mouth daily before breakfast.   losartan 50 MG tablet Commonly known as: COZAAR TAKE 1 TABLET(50 MG) BY MOUTH DAILY//please call office to schedule follow up for further refills.   omeprazole 40 MG capsule Commonly known as: PRILOSEC Take 40 mg by mouth daily.   pravastatin 80 MG tablet Commonly known  as: PRAVACHOL Take 80 mg by mouth daily.   tadalafil 5 MG tablet Commonly known as: CIALIS Take 1 tablet (5 mg total) by mouth daily.   tamsulosin 0.4 MG Caps capsule Commonly known as: FLOMAX Take 0.4 mg by mouth daily.   traZODone 50 MG tablet Commonly known as: DESYREL Take 50 mg by mouth at bedtime.   TURMERIC CURCUMIN PO Take 800 mg by mouth daily.   Vitamin D 50 MCG (2000 UT) tablet Take 4,000 Units by mouth daily.        Allergies:  Allergies  Allergen Reactions   Lipitor [Atorvastatin]     Myalgias    Simvastatin    Statins Other (See Comments)    Muscle pain with atorvastatin and simvastatin    Family History: No family history on file.  Social History:   reports that he quit smoking about 25 years ago. His smoking use included cigarettes. He started smoking about 25 years ago. He has never used smokeless tobacco. He reports that he does not drink alcohol and does not use drugs.  Physical Exam: BP (!) 151/67   Pulse 76   Ht 6' (1.829 m)   Wt 235 lb (106.6 kg)   BMI 31.87 kg/m   Constitutional:  Alert and oriented, no acute distress, nontoxic appearing HEENT: [place], AT Cardiovascular: No clubbing, cyanosis, or edema Respiratory: Normal respiratory effort, no increased work of breathing Skin: No rashes, bruises or suspicious lesions Neurologic: Grossly intact, no focal deficits, moving all 4 extremities Psychiatric: Normal mood and affect  Laboratory Data: Results for orders placed or performed in visit on 02/01/23  Bladder Scan (Post Void Residual) in office   Collection Time: 02/01/23  3:27 PM  Result Value Ref Range   Scan Result 24    Assessment & Plan:   1. Benign prostatic hyperplasia with nocturia (Primary) Significant symptomatic improvement on Gemtesa, emptying appropriately, will continue this and Flomax. Not using CPAP, would encourage using it in the future if symptoms worsen. - Bladder Scan (Post Void Residual) in office - Vibegron  (GEMTESA) 75 MG TABS; Take 1 tablet (75 mg total) by mouth daily.  Dispense: 30 tablet; Refill: 11   Return in about 1 year (around 02/01/2024) for Annual IPSS/PVR.  Carman Ching, PA-C  [facility] [place] [address] [place], [place] [address] [phone]

## 2023-02-02 ENCOUNTER — Other Ambulatory Visit: Payer: Self-pay | Admitting: *Deleted

## 2023-02-02 DIAGNOSIS — M19212 Secondary osteoarthritis, left shoulder: Secondary | ICD-10-CM | POA: Diagnosis not present

## 2023-02-02 DIAGNOSIS — M7582 Other shoulder lesions, left shoulder: Secondary | ICD-10-CM | POA: Diagnosis not present

## 2023-02-02 DIAGNOSIS — M879 Osteonecrosis, unspecified: Secondary | ICD-10-CM | POA: Diagnosis not present

## 2023-02-02 DIAGNOSIS — M7581 Other shoulder lesions, right shoulder: Secondary | ICD-10-CM | POA: Diagnosis not present

## 2023-02-02 MED ORDER — MIRABEGRON ER 50 MG PO TB24: 50.0000 mg | ORAL_TABLET | Freq: Every day | ORAL | 0 refills | Status: DC

## 2023-02-02 NOTE — Telephone Encounter (Signed)
Myrbetriq is the only other medication in the same class as Gemtesa.  It is generic though prices vary.  Can send in an Rx Myrbetriq 50 mg daily #30 to try.

## 2023-02-02 NOTE — Telephone Encounter (Signed)
Notified patient as instructed,sent in Myrbetriq  50 to [facility].

## 2023-02-08 ENCOUNTER — Other Ambulatory Visit: Payer: Self-pay | Admitting: Surgery

## 2023-02-08 DIAGNOSIS — M879 Osteonecrosis, unspecified: Secondary | ICD-10-CM

## 2023-02-08 DIAGNOSIS — M7582 Other shoulder lesions, left shoulder: Secondary | ICD-10-CM

## 2023-02-08 DIAGNOSIS — M19212 Secondary osteoarthritis, left shoulder: Secondary | ICD-10-CM

## 2023-02-09 ENCOUNTER — Telehealth: Payer: Self-pay | Admitting: Urology

## 2023-02-09 ENCOUNTER — Other Ambulatory Visit: Payer: Self-pay | Admitting: *Deleted

## 2023-02-09 DIAGNOSIS — N401 Enlarged prostate with lower urinary tract symptoms: Secondary | ICD-10-CM

## 2023-02-09 MED ORDER — MIRABEGRON ER 50 MG PO TB24: 50.0000 mg | ORAL_TABLET | Freq: Every day | ORAL | 0 refills | Status: AC

## 2023-02-09 NOTE — Telephone Encounter (Signed)
 He went to [facility] and they did not have a prescription ready but he would like the Myrbetriq   50  to be called into [facility] on [address].

## 2023-02-09 NOTE — Telephone Encounter (Signed)
 Medication sent today.

## 2023-02-15 ENCOUNTER — Ambulatory Visit
Admission: RE | Admit: 2023-02-15 | Discharge: 2023-02-15 | Disposition: A | Discharge status: Home / Self Care | Payer: HMO | Source: Ambulatory Visit | Attending: Surgery | Admitting: Surgery

## 2023-02-15 DIAGNOSIS — M19212 Secondary osteoarthritis, left shoulder: Secondary | ICD-10-CM

## 2023-02-15 DIAGNOSIS — M19012 Primary osteoarthritis, left shoulder: Secondary | ICD-10-CM | POA: Diagnosis not present

## 2023-02-15 DIAGNOSIS — M879 Osteonecrosis, unspecified: Secondary | ICD-10-CM

## 2023-02-15 DIAGNOSIS — M7582 Other shoulder lesions, left shoulder: Secondary | ICD-10-CM

## 2023-02-15 DIAGNOSIS — M25512 Pain in left shoulder: Secondary | ICD-10-CM | POA: Diagnosis not present

## 2023-02-15 DIAGNOSIS — M62512 Muscle wasting and atrophy, not elsewhere classified, left shoulder: Secondary | ICD-10-CM | POA: Diagnosis not present

## 2023-02-28 ENCOUNTER — Ambulatory Visit: Payer: HMO | Admitting: Podiatry

## 2023-02-28 ENCOUNTER — Encounter: Payer: Self-pay | Admitting: Podiatry

## 2023-02-28 DIAGNOSIS — M79674 Pain in right toe(s): Secondary | ICD-10-CM

## 2023-02-28 DIAGNOSIS — B351 Tinea unguium: Secondary | ICD-10-CM

## 2023-02-28 DIAGNOSIS — M79675 Pain in left toe(s): Secondary | ICD-10-CM

## 2023-02-28 NOTE — Progress Notes (Signed)
  Subjective:  Patient ID: Jared Baldwin, male    DOB: [birth year],  MRN: [id number]  Chief Complaint  Patient presents with   Nail Problem    "Clip my nails."    78 y.o. male presents with the above complaint. History confirmed with patient.  Nails are thickened elongated causing discomfort again.  Previous debridements have been quite helpful.  Objective:  Physical Exam: warm, good capillary refill, no trophic changes or ulcerative lesions, normal DP and PT pulses, and normal sensory exam. Left Foot: dystrophic yellowed discolored nail plates with subungual debris Right Foot: dystrophic yellowed discolored nail plates with subungual debris  Assessment:   1. Pain due to onychomycosis of toenails of both feet       Plan:  Patient was evaluated and treated and all questions answered.  Discussed the etiology and treatment options for the condition in detail with the patient.  Regular debridement has been helpful. Recommended debridement of the nails today. Sharp and mechanical debridement performed of all painful and mycotic nails today. Nails debrided in length and thickness using a nail nipper and rotary bur to level of comfort. Discussed treatment options including appropriate shoe gear. Follow up as needed for painful nails.   Return in about 9 weeks (around 05/02/2023) for painful thick fungal nails.

## 2023-03-13 ENCOUNTER — Other Ambulatory Visit: Payer: Self-pay | Admitting: Surgery

## 2023-03-14 DIAGNOSIS — M545 Low back pain, unspecified: Secondary | ICD-10-CM | POA: Diagnosis not present

## 2023-03-15 ENCOUNTER — Telehealth: Payer: Self-pay | Admitting: *Deleted

## 2023-03-15 ENCOUNTER — Encounter
Admission: RE | Admit: 2023-03-15 | Discharge: 2023-03-15 | Disposition: A | Discharge status: Home / Self Care | Source: Ambulatory Visit | Attending: Surgery | Admitting: Surgery

## 2023-03-15 ENCOUNTER — Other Ambulatory Visit: Payer: Self-pay

## 2023-03-15 VITALS — BP 121/59 | HR 71 | Resp 16 | Ht 67.0 in | Wt 209.0 lb

## 2023-03-15 DIAGNOSIS — Z01818 Encounter for other preprocedural examination: Secondary | ICD-10-CM | POA: Insufficient documentation

## 2023-03-15 DIAGNOSIS — Z01810 Encounter for preprocedural cardiovascular examination: Secondary | ICD-10-CM | POA: Diagnosis not present

## 2023-03-15 DIAGNOSIS — I491 Atrial premature depolarization: Secondary | ICD-10-CM | POA: Diagnosis not present

## 2023-03-15 HISTORY — DX: Presence of intraocular lens: Z96.1

## 2023-03-15 HISTORY — DX: Other chronic pancreatitis: K86.1

## 2023-03-15 HISTORY — DX: Deficiency of other specified B group vitamins: E53.8

## 2023-03-15 HISTORY — DX: Depression, unspecified: F32.A

## 2023-03-15 HISTORY — DX: Chronic kidney disease, stage 3a: N18.31

## 2023-03-15 HISTORY — DX: Unstable angina: I20.0

## 2023-03-15 LAB — CBC WITH DIFFERENTIAL/PLATELET
Abs Immature Granulocytes: 0.03 10*3/uL (ref 0.00–0.07)
Basophils Absolute: 0 10*3/uL (ref 0.0–0.1)
Basophils Relative: 1 %
Eosinophils Absolute: 0.2 10*3/uL (ref 0.0–0.5)
Eosinophils Relative: 3 %
HCT: 37.3 % — ABNORMAL LOW (ref 39.0–52.0)
Hemoglobin: 12.4 g/dL — ABNORMAL LOW (ref 13.0–17.0)
Immature Granulocytes: 1 %
Lymphocytes Relative: 16 %
Lymphs Abs: 1.1 10*3/uL (ref 0.7–4.0)
MCH: 31.4 pg (ref 26.0–34.0)
MCHC: 33.2 g/dL (ref 30.0–36.0)
MCV: 94.4 fL (ref 80.0–100.0)
Monocytes Absolute: 0.7 10*3/uL (ref 0.1–1.0)
Monocytes Relative: 10 %
Neutro Abs: 4.5 10*3/uL (ref 1.7–7.7)
Neutrophils Relative %: 69 %
Platelets: 192 10*3/uL (ref 150–400)
RBC: 3.95 MIL/uL — ABNORMAL LOW (ref 4.22–5.81)
RDW: 13.7 % (ref 11.5–15.5)
WBC: 6.5 10*3/uL (ref 4.0–10.5)
nRBC: 0 % (ref 0.0–0.2)

## 2023-03-15 LAB — COMPREHENSIVE METABOLIC PANEL
ALT: 25 U/L (ref 0–44)
AST: 22 U/L (ref 15–41)
Albumin: 3.7 g/dL (ref 3.5–5.0)
Alkaline Phosphatase: 83 U/L (ref 38–126)
Anion gap: 9 (ref 5–15)
BUN: 30 mg/dL — ABNORMAL HIGH (ref 8–23)
CO2: 26 mmol/L (ref 22–32)
Calcium: 9.2 mg/dL (ref 8.9–10.3)
Chloride: 104 mmol/L (ref 98–111)
Creatinine, Ser: 1.35 mg/dL — ABNORMAL HIGH (ref 0.61–1.24)
GFR, Estimated: 54 mL/min — ABNORMAL LOW (ref >60)
Glucose, Bld: 97 mg/dL (ref 70–99)
Potassium: 4 mmol/L (ref 3.5–5.1)
Sodium: 139 mmol/L (ref 135–145)
Total Bilirubin: 0.5 mg/dL (ref 0.0–1.2)
Total Protein: 6.5 g/dL (ref 6.5–8.1)

## 2023-03-15 LAB — URINALYSIS, ROUTINE W REFLEX MICROSCOPIC
Bacteria, UA: NONE SEEN
Bilirubin Urine: NEGATIVE
Glucose, UA: NEGATIVE mg/dL
Hgb urine dipstick: NEGATIVE
Ketones, ur: NEGATIVE mg/dL
Leukocytes,Ua: NEGATIVE
Nitrite: NEGATIVE
Protein, ur: NEGATIVE mg/dL
RBC / HPF: 0 RBC/hpf (ref 0–5)
Specific Gravity, Urine: 1.025 (ref 1.005–1.030)
Squamous Epithelial / HPF: 0 /HPF (ref 0–5)
WBC, UA: 0 WBC/hpf (ref 0–5)
pH: 5 (ref 5.0–8.0)

## 2023-03-15 LAB — SURGICAL PCR SCREEN
MRSA, PCR: NEGATIVE
Staphylococcus aureus: NEGATIVE

## 2023-03-15 NOTE — Telephone Encounter (Signed)
-----   Message from Verlee Monte sent at 03/15/2023  3:42 PM EDT ----- Regarding: Request for pre-operative cardiac clearance Request for pre-operative cardiac clearance:  1. What type of surgery is being performed?  LEFT TOTAL REVERSE SHOULDER ARTHROPLASTY  2. When is this surgery scheduled?  03/22/2023  3. Type of clearance being requested (medical, pharmacy, both)? MEDICAL   4. Are there any medications that need to be held prior to surgery? ASA 81 mg x 5 days  5. Practice name and name of physician performing surgery?  Performing surgeon: Dr. Leron Croak, MD Requesting clearance: Quentin Mulling, FNP-C    6. Anesthesia type (none, local, MAC, general)? GENERAL  7. What is the office phone and fax number?   Phone: [phone] Fax: [phone]  ATTENTION: Unable to create telephone message as per your standard workflow. Directed by [facility] providers to send requests for cardiac clearance to this pool for appropriate distribution to provider covering pre-operative clearances.   Quentin Mulling, MSN, APRN, FNP-C, CEN [facility]  Peri-operative Services Nurse Practitioner Phone: [phone] 03/15/23 3:42 PM

## 2023-03-15 NOTE — Patient Instructions (Addendum)
 Your procedure is scheduled on: Thursday 03/22/23 To find out your arrival time, please call [phone] between 1PM - 3PM on:  Wednesday 03/21/23  Report to the Registration Desk on the 1st floor of the [facility]. FREE Valet parking is available.  If your arrival time is 6:00 am, do not arrive before that time as the [facility] entrance doors do not open until 6:00 am.  REMEMBER: Instructions that are not followed completely may result in serious medical risk, up to and including death; or upon the discretion of your surgeon and anesthesiologist your surgery may need to be rescheduled.  Do not eat food after midnight the night before surgery.  No gum chewing or hard candies.  You may however, drink CLEAR liquids up to 2 hours before you are scheduled to arrive for your surgery. Do not drink anything within 2 hours of your scheduled arrival time.  Clear liquids include: - water  - apple juice without pulp - gatorade (not RED colors) - black coffee or tea (Do NOT add milk or creamers to the coffee or tea) Do NOT drink anything that is not on this list.  Type 1 and Type 2 diabetics should only drink water.  In addition, your doctor has ordered for you to drink the provided:  Ensure Pre-Surgery Clear Carbohydrate Drink  Drinking this carbohydrate drink up to two hours before surgery helps to reduce insulin resistance and improve patient outcomes. Please complete drinking 2 hours before scheduled arrival time.  One week prior to surgery: Stop Anti-inflammatories (NSAIDS) such as Advil, Aleve, Ibuprofen, Motrin, Naproxen, Naprosyn and Aspirin based products such as Excedrin, Goody's Powder, BC Powder. You may however, continue to take Tylenol if needed for pain up until the day of surgery.  Stop ANY OVER THE COUNTER supplements and vitamins until after surgery.  Continue taking all prescribed medications with the exception of the following: Semaglutide hold until after surgery.  Hold Aspirin starting Saturday 03/17/23, tadalafil (CIALIS) do not take for 3 days before surgery  TAKE ONLY THESE MEDICATIONS THE MORNING OF SURGERY WITH A SIP OF WATER:  buPROPion (WELLBUTRIN XL) 150 MG 24 hr tablet  ezetimibe (ZETIA) 10 MG tablet  levothyroxine (SYNTHROID, LEVOTHROID) 88 MCG tablet  omeprazole (PRILOSEC) 40 MG capsule   No Alcohol for 24 hours before or after surgery.  No Smoking including e-cigarettes for 24 hours before surgery.  No chewable tobacco products for at least 6 hours before surgery.  No nicotine patches on the day of surgery.  Do not use any "recreational" drugs for at least a week (preferably 2 weeks) before your surgery.  Please be advised that the combination of cocaine and anesthesia may have negative outcomes, up to and including death. If you test positive for cocaine, your surgery will be cancelled.  On the morning of surgery brush your teeth with toothpaste and water, you may rinse your mouth with mouthwash if you wish. Do not swallow any toothpaste or mouthwash.  Use CHG Soap or wipes as directed on instruction sheet.  Do not wear lotions, powders, or perfumes. NO DEODORANT  Do not shave body hair from the neck down 48 hours before surgery.  Wear comfortable clothing (specific to your surgery type) to the hospital.  Do not wear jewelry, make-up, hairpins, clips or nail polish.  For welded (permanent) jewelry: bracelets, anklets, waist bands, etc.  Please have this removed prior to surgery.  If it is not removed, there is a chance that hospital personnel will  need to cut it off on the day of surgery. Contact lenses, hearing aids and dentures may not be worn into surgery.  Do not bring valuables to the hospital. [facility] is not responsible for any missing/lost belongings or valuables.   Total Shoulder Arthroplasty:  use Benzoyl Peroxide 5% Gel as directed on instruction sheet.  Notify your doctor if there is any change in your medical  condition (cold, fever, infection).  If you are being discharged the day of surgery, you will not be allowed to drive home. You will need a responsible individual to drive you home and stay with you for 24 hours after surgery.   If you are taking public transportation, you will need to have a responsible individual with you.  If you are being admitted to the hospital overnight, leave your suitcase in the car. After surgery it may be brought to your room.  In case of increased patient census, it may be necessary for you, the patient, to continue your postoperative care in the Same Day Surgery department.  After surgery, you can help prevent lung complications by doing breathing exercises.  Take deep breaths and cough every 1-2 hours. Your doctor may order a device called an Incentive Spirometer to help you take deep breaths. When coughing or sneezing, hold a pillow firmly against your incision with both hands. This is called "splinting." Doing this helps protect your incision. It also decreases belly discomfort.  Surgery Visitation Policy:  Patients undergoing a surgery or procedure may have two family members or support persons with them as long as the person is not COVID-19 positive or experiencing its symptoms.   Inpatient Visitation:    Visiting hours are 7 a.m. to 8 p.m. Up to four visitors are allowed at one time in a patient room. The visitors may rotate out with other people during the day. One designated support person (adult) may remain overnight.  Due to an increase in RSV and influenza rates and associated hospitalizations, children ages 52 and under will not be able to visit patients in [facility]. Masks continue to be strongly recommended.  Please call the Pre-admissions Testing Dept. at [phone] if you have any questions about these instructions.    Pre-operative 5 CHG Bath Instructions   You can play a key role in reducing the risk of infection after  surgery. Your skin needs to be as free of germs as possible. You can reduce the number of germs on your skin by washing with CHG (chlorhexidine gluconate) soap before surgery. CHG is an antiseptic soap that kills germs and continues to kill germs even after washing.   DO NOT use if you have an allergy to chlorhexidine/CHG or antibacterial soaps. If your skin becomes reddened or irritated, stop using the CHG and notify one of our RNs at [phone].   Please shower with the CHG soap starting 4 days before surgery using the following schedule:   SUNDAY 03/18/23 - THURSDAY 03/22/23    Please keep in mind the following:  DO NOT shave, including legs and underarms, starting the day of your first shower.   You may shave your face at any point before/day of surgery.  Place clean sheets on your bed the day you start using CHG soap. Use a clean washcloth (not used since being washed) for each shower. DO NOT sleep with pets once you start using the CHG.   CHG Shower Instructions:  If you choose to wash your hair and private area,  wash first with your normal shampoo/soap.  After you use shampoo/soap, rinse your hair and body thoroughly to remove shampoo/soap residue.  Turn the water OFF and apply about 3 tablespoons (45 ml) of CHG soap to a CLEAN washcloth.  Apply CHG soap ONLY FROM YOUR NECK DOWN TO YOUR TOES (washing for 3-5 minutes)  DO NOT use CHG soap on face, private areas, open wounds, or sores.  Pay special attention to the area where your surgery is being performed.  If you are having back surgery, having someone wash your back for you may be helpful. Wait 2 minutes after CHG soap is applied, then you may rinse off the CHG soap.  Pat dry with a clean towel  Put on clean clothes/pajamas   If you choose to wear lotion, please use ONLY the CHG-compatible lotions on the back of this paper.     Additional instructions for the day of surgery: DO NOT APPLY any lotions, deodorants, cologne, or  perfumes.   Put on clean/comfortable clothes.  Brush your teeth.  Ask your nurse before applying any prescription medications to the skin.      CHG Compatible Lotions   Aveeno Moisturizing lotion  Cetaphil Moisturizing Cream  Cetaphil Moisturizing Lotion  Clairol Herbal Essence Moisturizing Lotion, Dry Skin  Clairol Herbal Essence Moisturizing Lotion, Extra Dry Skin  Clairol Herbal Essence Moisturizing Lotion, Normal Skin  Curel Age Defying Therapeutic Moisturizing Lotion with Alpha Hydroxy  Curel Extreme Care Body Lotion  Curel Soothing Hands Moisturizing Hand Lotion  Curel Therapeutic Moisturizing Cream, Fragrance-Free  Curel Therapeutic Moisturizing Lotion, Fragrance-Free  Curel Therapeutic Moisturizing Lotion, Original Formula  Eucerin Daily Replenishing Lotion  Eucerin Dry Skin Therapy Plus Alpha Hydroxy Crme  Eucerin Dry Skin Therapy Plus Alpha Hydroxy Lotion  Eucerin Original Crme  Eucerin Original Lotion  Eucerin Plus Crme Eucerin Plus Lotion  Eucerin TriLipid Replenishing Lotion  Keri Anti-Bacterial Hand Lotion  Keri Deep Conditioning Original Lotion Dry Skin Formula Softly Scented  Keri Deep Conditioning Original Lotion, Fragrance Free Sensitive Skin Formula  Keri Lotion Fast Absorbing Fragrance Free Sensitive Skin Formula  Keri Lotion Fast Absorbing Softly Scented Dry Skin Formula  Keri Original Lotion  Keri Skin Renewal Lotion Keri Silky Smooth Lotion  Keri Silky Smooth Sensitive Skin Lotion  Nivea Body Creamy Conditioning Oil  Nivea Body Extra Enriched Lotion  Nivea Body Original Lotion  Nivea Body Sheer Moisturizing Lotion Nivea Crme  Nivea Skin Firming Lotion  NutraDerm 30 Skin Lotion  NutraDerm Skin Lotion  NutraDerm Therapeutic Skin Cream  NutraDerm Therapeutic Skin Lotion  ProShield Protective Hand Cream  Provon moisturizing lotion  Preparing for Total Shoulder Arthroplasty  Before surgery, you can play an important role by reducing the number  of germs on your skin by using the following products:  Benzoyl Peroxide Gel  o Reduces the number of germs present on the skin  o Applied twice a day to shoulder area starting two days before surgery  Chlorhexidine Gluconate (CHG) Soap  o An antiseptic cleaner that kills germs and bonds with the skin to continue killing germs even after washing  o Used for showering the night before surgery and morning of surgery  BENZOYL PEROXIDE 5% GEL  Please do not use if you have an allergy to benzoyl peroxide. If your skin becomes reddened/irritated stop using the benzoyl peroxide.  Starting two days before surgery, apply as follows:  1. Apply benzoyl peroxide in the morning and at night. Apply after taking a shower. If you are  not taking a shower, clean entire shoulder front, back, and side along with the armpit with a clean wet washcloth.  2. Place a quarter-sized dollop on your shoulder and rub in thoroughly, making sure to cover the front, back, and side of your shoulder, along with the armpit.  2 days before (03/20/23 TUESDAY)   ____ AM ____ PM 1 day before (03/21/23 WEDNESDAY) ____ AM ____ PM  3. Do this twice a day for two days. (Last application is the night before surgery, AFTER using the CHG soap).  4. Do NOT apply benzoyl peroxide gel on the day of surgery.

## 2023-03-15 NOTE — Telephone Encounter (Signed)
   Pre-operative Risk Assessment    Patient Name: Jared Baldwin  DOB: June 15, 1945 MRN: [id number]   Date of last office visit: 11/03/21 DR. ARIDA Date of next office visit: NONE   Request for Surgical Clearance    Procedure: LEFT TOTAL REVERSE SHOULDER ARTHROPLASTY  Date of Surgery:  Clearance 03/22/23                                Surgeon:  DR. Joice Lofts Surgeon's Group or Practice Name:  [facility] Phone number:  [phone] Fax number:  [phone] Quentin Mulling, FNP-C, CEN   Type of Clearance Requested:   - Medical  - Pharmacy:  Hold Aspirin ASA x 5 DAYS PRIOR   Type of Anesthesia:  General    Additional requests/questions:    Elpidio Anis   03/15/2023, 3:53 PM

## 2023-03-16 ENCOUNTER — Ambulatory Visit: Attending: Student | Admitting: Student

## 2023-03-16 ENCOUNTER — Encounter: Payer: Self-pay | Admitting: Student

## 2023-03-16 VITALS — BP 118/50 | HR 72 | Ht 67.0 in | Wt 211.0 lb

## 2023-03-16 DIAGNOSIS — I10 Essential (primary) hypertension: Secondary | ICD-10-CM

## 2023-03-16 DIAGNOSIS — G720 Drug-induced myopathy: Secondary | ICD-10-CM | POA: Diagnosis not present

## 2023-03-16 DIAGNOSIS — I350 Nonrheumatic aortic (valve) stenosis: Secondary | ICD-10-CM | POA: Diagnosis not present

## 2023-03-16 DIAGNOSIS — I2510 Atherosclerotic heart disease of native coronary artery without angina pectoris: Secondary | ICD-10-CM

## 2023-03-16 DIAGNOSIS — T466X5A Adverse effect of antihyperlipidemic and antiarteriosclerotic drugs, initial encounter: Secondary | ICD-10-CM | POA: Diagnosis not present

## 2023-03-16 DIAGNOSIS — Z01810 Encounter for preprocedural cardiovascular examination: Secondary | ICD-10-CM

## 2023-03-16 DIAGNOSIS — E785 Hyperlipidemia, unspecified: Secondary | ICD-10-CM

## 2023-03-16 NOTE — Telephone Encounter (Signed)
   Name: Jared Baldwin  DOB: 1945-03-10  MRN: [id number]  Primary Cardiologist: Lorine Bears, MD  Chart reviewed as part of pre-operative protocol coverage. Because of Jared Baldwin's past medical history and time since last visit, he will require a follow-up in-office visit in order to better assess preoperative cardiovascular risk. Pt has not been seen since 2023.   Pre-op covering staff: - Please schedule appointment and call patient to inform them. If patient already had an upcoming appointment within acceptable timeframe, please add "pre-op clearance" to the appointment notes so provider is aware. - Please contact requesting surgeon's office via preferred method (i.e, phone, fax) to inform them of need for appointment prior to surgery.  Joylene Grapes, NP  03/16/2023, 8:28 AM

## 2023-03-16 NOTE — Patient Instructions (Signed)
 Medication Instructions:  Your Physician recommend you continue on your current medication as directed.    *If you need a refill on your cardiac medications before your next appointment, please call your pharmacy*   Lab Work: None ordered at this time    Follow-Up: At [facility], you and your health needs are our priority.  As part of our continuing mission to provide you with exceptional heart care, we have created designated Provider Care Teams.  These Care Teams include your primary Cardiologist (physician) and Advanced Practice Providers (APPs -  Physician Assistants and Nurse Practitioners) who all work together to provide you with the care you need, when you need it.  We recommend signing up for the patient portal called "MyChart".  Sign up information is provided on this After Visit Summary.  MyChart is used to connect with patients for Virtual Visits (Telemedicine).  Patients are able to view lab/test results, encounter notes, upcoming appointments, etc.  Non-urgent messages can be sent to your provider as well.   To learn more about what you can do with MyChart, go to [email or web address].    Your next appointment:   1 year(s)  Provider:   You may see Lorine Bears, MD or one of the following Advanced Practice Providers on your designated Care Team:   Nicolasa Ducking, NP Eula Listen, PA-C Cadence Fransico Michael, PA-C Charlsie Quest, NP Carlos Levering, NP

## 2023-03-16 NOTE — Telephone Encounter (Signed)
 I will update the surgeon office as FYI as well pt needs appt.

## 2023-03-16 NOTE — Progress Notes (Signed)
 Cardiology Clinic Note   Date: 03/16/2023 ID: Natalio, Salois [birth year], MRN [id number]  Primary Cardiologist:  Lorine Bears, MD  Chief Complaint   Jared Baldwin is a 78 y.o. male who presents to the clinic today for preoperative risk assessment.   Patient Profile   Jared Baldwin is followed by Dr. Kirke Corin for the history outlined below.      Past medical history significant for: Nonobstructive CAD. R/LHC 11/05/2020 (unstable angina): Mid LCx 30%.  Distal LAD 50%.  Proximal LAD 60%.  Mid LAD 30%.  Ostial to mid LM 30%.  Proximal RCA 65%.  Distal RCA 30%.  RPDA 50%.  Moderate two-vessel CAD involving proximal LAD and proximal RCA.  FFR performed on proximal LAD found to be nonsignificant.  Mild aortic stenosis by gradient with peak gradient 12 to 15 mmHg.  RHC showed mildly elevated filling pressures, mild pulmonary hypertension and normal cardiac output. Aortic valve stenosis. Echo 02/03/2022: EF 60 to 65%.  No RWMA.  Grade I DD.  Normal RV size/function.  Mild LAE.  Moderate calcification of aortic valve.  Mild to moderate aortic valve stenosis, mean gradient 16 mmHg.  (Previous gradient 12 mmHg November 2022).  Recommendation for repeat echo in 2 years. Hypertension. Hyperlipidemia. Lipid panel 08/24/2022: LDL 66, HDL 41, TG 95, total 126. Gallstone pancreatitis. Obesity. S/p bariatric surgery. CKD stage IIIa.  In summary, patient was evaluated in 2014 for atypical chest pain and dyspnea.  Stress testing showed no evidence of ischemia or infarct.  Echo showed normal LV function with mild aortic stenosis.  In October 2022 he reported intermittent substernal chest discomfort that lasted 15 to 30 minutes and resolved on its own.  He was placed on aspirin and scheduled for diagnostic catheterization (as detailed above).  Echo at that time showed EF 60 to 65%, Grade I DD, mildly elevated RVSP 23.8 mmHg, mild AS/AI.  Patient was last seen in the office by Dr. Kirke Corin on 11/03/2021 for  routine follow-up.  He was doing well at that time and no changes were made.  Echo was updated February 2024 as detailed above.     History of Present Illness    Today, patient is pending left total reverse shoulder arthroplasty by Dr. Joice Lofts on 03/22/2023. He denies shortness of breath, dyspnea on exertion, lower extremity edema, orthopnea or PND. No chest pain, pressure, or tightness. No palpitations. He does not perform routine exercise. He stays active working part time at [employer]. He lives alone and performs light to moderate household activities.         ROS: All other systems reviewed and are otherwise negative except as noted in History of Present Illness.  EKGs/Labs Reviewed    EKG Interpretation Date/Time:  Friday March 16 2023 15:44:57 EDT Ventricular Rate:  72 PR Interval:  166 QRS Duration:  68 QT Interval:  394 QTC Calculation: 431 R Axis:   10  Text Interpretation: Normal sinus rhythm Possible Inferior infarct , age undetermined When compared with ECG of 15-Mar-2023 15:13, Premature atrial complexes are no longer Present Confirmed by Carlos Levering [phone]) on 03/16/2023 3:59:43 PM   03/15/2023: ALT 25; AST 22; BUN 30; Creatinine, Ser 1.35; Potassium 4.0; Sodium 139   03/15/2023: Hemoglobin 12.4; WBC 6.5    Physical Exam    VS:  BP (!) 118/50   Pulse 72   Ht 5\' 7"  (1.702 m)   Wt 211 lb (95.7 kg)   SpO2 98%   BMI 33.05 kg/m  ,  BMI Body mass index is 33.05 kg/m.  GEN: Well nourished, well developed, in no acute distress. Neck: No JVD or carotid bruits. Cardiac:  RRR. No murmurs. No rubs or gallops.   Respiratory:  Respirations regular and unlabored. Clear to auscultation without rales, wheezing or rhonchi. GI: Soft, nontender, nondistended. Extremities: Radials/DP/PT 2+ and equal bilaterally. No clubbing or cyanosis. No edema.  Skin: Warm and dry, no rash. Neuro: Strength intact.  Assessment & Plan   Nonobstructive CAD [facility] November  2022 showed moderate two-vessel CAD involving proximal LAD and proximal RCA, FFR was performed on proximal LAD and found to be insignificant.  Aggressive medical therapy was recommended.  Patient denies chest pain, pressure or tightness. EKG demonstrates NSR, 72 bpm.  -Continue aspirin, Zetia.  Aortic valve stenosis Echo February 2024 showed normal LV/RV function, no RWMA, Grade I DD, mild LAE, moderate calcification of aortic valve, mild to moderate aortic valve stenosis, mean gradient 16 mmHg.  Patient denies lower extremity edema, shortness of breath, lightheadedness, dizziness, presyncope or syncope.  -Repeat echo February 2026.  Hypertension BP today 118/50. No dizziness or headaches reported.  -Continue chlorthalidone, losartan.  Hyperlipidemia/statin myopathy LDL August 2024 66, at goal.  Patient reports myalgias with statins. -Continue Zetia.  Preoperative cardiovascular risk assessment Left total reverse shoulder arthroplasty by Dr. Joice Lofts on 03/22/2023.  According to the RCRI, patient has a 0.4% risk of MACE. Patient reports activity equivalent to >4.0 METS (works in [employer] part time, performs light to moderate household activites).  -Based on ACC/AHA guidelines, Jared Baldwin would be at acceptable risk for the planned procedure without further cardiovascular testing.  -Ideally aspirin should be continued without interruption, however if the bleeding risk is too great, aspirin may be held for 5-7 days prior to surgery. Please resume aspirin post operatively when it is felt to be safe from a bleeding standpoint.   Disposition: Return in 1 year or sooner as needed.          Signed, Etta Grandchild. Derrek Puff, DNP, NP-C

## 2023-03-16 NOTE — Telephone Encounter (Signed)
 I will send a message to [facility] scheduling to see if they can get the pt in for preop clearance. Procedure is 03/22/23. Our office received request yesterday 03/15/23. Pt also needs to hold ASA x 5 days prior.

## 2023-03-16 NOTE — Telephone Encounter (Signed)
 Pt has been scheduled to see Carlos Levering, NP today for preop clearance.   I will update all parties involved.

## 2023-03-19 ENCOUNTER — Encounter: Payer: Self-pay | Admitting: Surgery

## 2023-03-19 DIAGNOSIS — M879 Osteonecrosis, unspecified: Secondary | ICD-10-CM | POA: Diagnosis not present

## 2023-03-19 DIAGNOSIS — M19212 Secondary osteoarthritis, left shoulder: Secondary | ICD-10-CM | POA: Diagnosis not present

## 2023-03-19 DIAGNOSIS — M7582 Other shoulder lesions, left shoulder: Secondary | ICD-10-CM | POA: Diagnosis not present

## 2023-03-19 NOTE — Telephone Encounter (Signed)
 Jared Baldwin

## 2023-03-19 NOTE — Progress Notes (Signed)
 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 Clinical nursing staff has updated patient's PMH/PSHx, current medication list, and drug allergies/intolerances to ensure comprehensive history available to assist in medical decision making as it pertains to the aforementioned surgical procedure and anticipated anesthetic course. Extensive review of available clinical information personally performed. [facility] PMH and PSHx updated with any diagnoses/procedures that  may have been inadvertently omitted during his intake with the pre-admission testing department's nursing staff.  Clinical Discussion:  Jared Baldwin is a 78 y.o. male who is submitted for pre-surgical anesthesia review and clearance prior to him undergoing the above procedure. Patient is a Former Smoker (quit 07/1997). 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  Patient is followed by cardiology Kirke Corin, MD). He was last seen in the cardiology clinic on 03/16/2023; notes reviewed. At the time of his clinic visit, patient doing well overall from a cardiovascular perspective. Patient denied any chest pain, shortness of breath, PND, orthopnea,  palpitations, significant peripheral edema, weakness, fatigue, vertiginous symptoms, or presyncope/syncope. Patient with a past medical history significant for cardiovascular diagnoses. Documented physical exam was grossly benign, providing no evidence of acute exacerbation and/or decompensation of the patient's known cardiovascular conditions.  Myocardial perfusion imaging study performed on 03/04/2012 revealed a normal left ventricular systolic function with hyperdynamic LVEF of 69%.  There were no regional wall motion abnormalities.  There was no evidence of stress-induced myocardial ischemia or arrhythmia; no scintigraphic evidence of scar.  Study determined to be normal and low risk.  Patient underwent diagnostic RIGHT/LEFT heart catheterization on 11/05/2020 revealing multivessel CAD; 30% mid LCx, 50% distal LAD, 60% proximal LAD, 30% mid LAD, 30% ostial-mid LM, 65% proximal RCA, 30% distal RCA, and 50% RPDA.  The stenosis in the proximal LAD was interrogated with FFR and found to be nonsignificant with an IFR ratio of 0.93.  There was mild aortic valve stenosis noted with a peak gradient of 12-15 mmHg.  Hemodynamics: mean PA = 23 mmHg, mean PCWP = 17 mmHg, AO saturation = 97.7%, CO = 4.88 L/min, CI = 2.25 L/min/m.  Mildly elevated filling pressures and normal cardiac output consistent with known mild pulmonary hypertension.  Most recent TTE performed on 02/03/2022 revealed a normal left ventricular systolic function with an EF of 60-65%. There were no regional wall motion abnormalities. Left ventricular diastolic Doppler parameters consistent with abnormal relaxation (G1DD).  Left atrium was mildly dilated.  Right ventricular size and function normal with a TAPSE measuring 2.7 cm  (normal range >/= 1.6 cm).  There was mild mitral annular calcification.  There was no significant valvular regurgitation.  Aortic valve mildly to moderately stenotic with a mean pressure gradient of 16.0 mmHg; AVA (VTI) =  2.29 cm.  Aorta normal in size with no evidence of ectasia or aneurysmal  dilatation.  Blood pressure well controlled at 118/50 mmHg on currently prescribed diuretic (chlorthalidone + HCTZ), and ARB (losartan) therapies.  Patient is on ezetimibe for his HLD diagnosis and ASCVD prevention. In the setting of known cardiovascular diagnoses, it is important note that patient is on a PDE5i medication (tadalafil) for and erectile dysfunction diagnosis. Patient is not diabetic. He does not have an OSAH diagnosis.  Patient makes an effort to maintain an active lifestyle.  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  He is able to complete all of his ADLs/IADLs without cardiovascular limitation.  Per the DASI, patient able to exceed 4 METS of physical activity without experiencing any significant degree of angina/anginal equivalent symptoms.  No changes were made to his medication regimen during his visit with cardiology.  Patient scheduled to follow-up with outpatient cardiology in 1 year or sooner if needed.  Jared Baldwin is scheduled for an elective ARTHROPLASTY, SHOULDER, TOTAL, REVERSE (Left: Shoulder) on 03/22/2023 with Dr. Leron Croak, MD.  Given patient's past medical history significant for cardiovascular diagnoses, presurgical cardiac clearance was sought by the PAT team.  Per cardiology, "according to the RCRI, patient has a 0.4% risk of MACE. Patient reports activity equivalent to >4.0 METS (works in [employer] part time, performs light to moderate household activities). Based on ACC/AHA guidelines, Jared Baldwin would be at ACCEPTABLE risk for the planned procedure without further cardiovascular testing".  In review of the patient's chart, it is noted that he is on daily oral antithrombotic therapy. He has been instructed on recommendations for holding his daily low-dose ASA for 5 days prior to his procedure with plans to restart as soon as postoperative bleeding risk felt to be minimized by  his attending surgeon. The patient has been instructed that his last dose of ASA should be on 03/16/2023.  Patient denies previous perioperative complications with anesthesia in the past. In review his EMR, it is noted that patient underwent a MAC anesthetic course at [facility] ([facility] in 03/2021 without documented complications.      03/16/2023    3:46 PM 03/15/2023    2:00 PM 02/01/2023    3:19 PM  Vitals with BMI  Height 5\' 7"  5\' 7"  6\' 0"   Weight 211 lbs 209 lbs 235 lbs  BMI 33.04 32.73 31.86  Systolic 118 121 725  Diastolic 50 59 67  Pulse 72 71 76   Providers/Specialists:  NOTE: Primary physician provider listed below. Patient may have been seen by APP or partner within same practice.   PROVIDER ROLE / SPECIALTY LAST OV  Poggi, Excell Seltzer, MD Orthopedics (Surgeon) 02/02/2023  Lynnea Ferrier, MD Primary Care Provider 03/14/2023  Lorine Bears, MD Cardiology 03/16/2023  Gerrie Nordmann, MD Rheumatology 01/31/2023   Allergies:   Allergies  Allergen Reactions   Lipitor [Atorvastatin]     Myalgias    Simvastatin     Myalgias    Statins Other (See Comments)    Muscle pain with atorvastatin and simvastatin   Current Home Medications:   No current facility-administered medications for this encounter.    Acetaminophen-Pamabrom 500-25 MG TABS   aspirin EC 81 MG tablet   buPROPion (WELLBUTRIN XL) 150 MG 24 hr tablet   Cholecalciferol (VITAMIN D) 50 MCG (2000 UT) tablet   Cyanocobalamin 5000 MCG/ML LIQD   ezetimibe (ZETIA) 10 MG tablet   hydrochlorothiazide (HYDRODIURIL) 25 MG tablet   ibuprofen (ADVIL) 200 MG tablet   levothyroxine (SYNTHROID, LEVOTHROID)  88 MCG tablet   losartan (COZAAR) 50 MG tablet   LUMIGAN 0.01 % SOLN   omeprazole (PRILOSEC) 40 MG capsule   Semaglutide-Weight Management (WEGOVY) 0.5 MG/0.5ML SOAJ   traZODone (DESYREL) 50 MG tablet   chlorthalidone (HYGROTON) 25 MG tablet   gabapentin (NEURONTIN) 100 MG  capsule   mirabegron ER (MYRBETRIQ) 50 MG TB24 tablet   tadalafil (CIALIS) 5 MG tablet   History:   Past Medical History:  Diagnosis Date   Aortic atherosclerosis (HCC)    Aortic stenosis    a. 02/2012 Echo: EF 60-65%, very mild AS; b. 11/2020 Echo: EF 60-65%, GrI DD, no rwma, Nl RV size/fxn, RVSP 23.35mmHg, Mild AS/AI (AoV mean grad ; Vmax 2.34m/s).   Arthritis    B12 deficiency    CAD (coronary artery disease)    11/2020 Cath: LM 30ost/m, LAD 60p (nl iFR @ 0.93), 76m, 50d, LCX 49m, OM1/2 nl, OM3 min irregs, RCA 65p, 30d, RPDA 50. Mild AS w/ peak grad of 12-4mmHg. RHC w/ mild PAH. **Avoid future cath via R Radial due to signif prox R RA stenosis**   Chronic back pain    Depression    Diastolic dysfunction    Erectile dysfunction    a.) on PDE5i (tadalafil)   Gallstone pancreatitis    GERD (gastroesophageal reflux disease)    Hyperlipidemia    Hypertension    Hypothyroidism    Idiopathic chronic pancreatitis (HCC)    Long-term use of aspirin therapy    Obesity    a. s/p gastric sleeve.   Pseudophakia, both eyes    Stage 3a chronic kidney disease (CKD) (HCC)    Unstable angina (HCC)    Past Surgical History:  Procedure Laterality Date   BACK SURGERY     CATARACT EXTRACTION W/ INTRAOCULAR LENS  IMPLANT, BILATERAL     CHOLECYSTECTOMY N/A 09/09/2016   Procedure: LAPAROSCOPIC CHOLECYSTECTOMY WITH INTRAOPERATIVE CHOLANGIOGRAM;  Surgeon: Henrene Dodge, MD;  Location: [facility] ORS;  Service: General;  Laterality: N/A;   CORONARY PRESSURE/FFR WITH 3D MAPPING N/A 11/05/2020   Procedure: Coronary Pressure Wire/FFR w/3D Mapping;  Surgeon: Iran Ouch, MD;  Location: [facility] INVASIVE CV LAB;  Service: Cardiovascular;  Laterality: N/A;   KNEE ARTHROPLASTY Right 07/24/2016   Procedure: COMPUTER ASSISTED TOTAL KNEE ARTHROPLASTY;  Surgeon: Donato Heinz, MD;  Location: [facility] ORS;  Service: Orthopedics;  Laterality: Right;   RIGHT/LEFT HEART CATH AND CORONARY ANGIOGRAPHY Bilateral  11/05/2020   Procedure: RIGHT/LEFT HEART CATH AND CORONARY ANGIOGRAPHY;  Surgeon: Iran Ouch, MD;  Location: [facility] INVASIVE CV LAB;  Service: Cardiovascular;  Laterality: Bilateral;   TOTAL KNEE ARTHROPLASTY Bilateral    UMBILICAL HERNIA REPAIR N/A 09/09/2016   Procedure: HERNIA REPAIR UMBILICAL ADULT;  Surgeon: Henrene Dodge, MD;  Location: [facility] ORS;  Service: General;  Laterality: N/A;   No family history on file. Social History   Tobacco Use   Smoking status: Former    Current packs/day: 0.00    Types: Cigarettes    Start date: 07/11/1997    Quit date: 07/11/1997    Years since quitting: 25.7   Smokeless tobacco: Never  Substance Use Topics   Alcohol use: No   Pertinent Clinical Results:  LABS:  Hospital Outpatient Visit on 03/15/2023  Component Date Value Ref Range Status   WBC 03/15/2023 6.5  4.0 - 10.5 K/uL Final   RBC 03/15/2023 3.95 (L)  4.22 - 5.81 MIL/uL Final   Hemoglobin 03/15/2023 12.4 (L)  13.0 - 17.0 g/dL Final   HCT  03/15/2023 37.3 (L)  39.0 - 52.0 % Final   MCV 03/15/2023 94.4  80.0 - 100.0 fL Final   MCH 03/15/2023 31.4  26.0 - 34.0 pg Final   MCHC 03/15/2023 33.2  30.0 - 36.0 g/dL Final   RDW 28/41/3244 13.7  11.5 - 15.5 % Final   Platelets 03/15/2023 192  150 - 400 K/uL Final   nRBC 03/15/2023 0.0  0.0 - 0.2 % Final   Neutrophils Relative % 03/15/2023 69  % Final   Neutro Abs 03/15/2023 4.5  1.7 - 7.7 K/uL Final   Lymphocytes Relative 03/15/2023 16  % Final   Lymphs Abs 03/15/2023 1.1  0.7 - 4.0 K/uL Final   Monocytes Relative 03/15/2023 10  % Final   Monocytes Absolute 03/15/2023 0.7  0.1 - 1.0 K/uL Final   Eosinophils Relative 03/15/2023 3  % Final   Eosinophils Absolute 03/15/2023 0.2  0.0 - 0.5 K/uL Final   Basophils Relative 03/15/2023 1  % Final   Basophils Absolute 03/15/2023 0.0  0.0 - 0.1 K/uL Final   Immature Granulocytes 03/15/2023 1  % Final   Abs Immature Granulocytes 03/15/2023 0.03  0.00 - 0.07 K/uL Final   Performed at [facility], [address] [place], [place] [address]   Sodium 03/15/2023 139  135 - 145 mmol/L Final   Potassium 03/15/2023 4.0  3.5 - 5.1 mmol/L Final   Chloride 03/15/2023 104  98 - 111 mmol/L Final   CO2 03/15/2023 26  22 - 32 mmol/L Final   Glucose, Bld 03/15/2023 97  70 - 99 mg/dL Final   Glucose reference range applies only to samples taken after fasting for at least 8 hours.   BUN 03/15/2023 30 (H)  8 - 23 mg/dL Final   Creatinine, Ser 03/15/2023 1.35 (H)  0.61 - 1.24 mg/dL Final   Calcium 25/36/6440 9.2  8.9 - 10.3 mg/dL Final   Total Protein 34/74/2595 6.5  6.5 - 8.1 g/dL Final   Albumin 63/87/5643 3.7  3.5 - 5.0 g/dL Final   AST 32/95/1884 22  15 - 41 U/L Final   ALT 03/15/2023 25  0 - 44 U/L Final   Alkaline Phosphatase 03/15/2023 83  38 - 126 U/L Final   Total Bilirubin 03/15/2023 0.5  0.0 - 1.2 mg/dL Final   GFR, Estimated 03/15/2023 54 (L)  >60 mL/min Final   Comment: (NOTE) Calculated using the CKD-EPI Creatinine Equation (2021)    Anion gap 03/15/2023 9  5 - 15 Final   Performed at [facility], [address] [place], [place] [address]   Color, Urine 03/15/2023 YELLOW (A)  YELLOW Final   APPearance 03/15/2023 TURBID (A)  CLEAR Final   Specific Gravity, Urine 03/15/2023 1.025  1.005 - 1.030 Final   pH 03/15/2023 5.0  5.0 - 8.0 Final   Glucose, UA 03/15/2023 NEGATIVE  NEGATIVE mg/dL Final   Hgb urine dipstick 03/15/2023 NEGATIVE  NEGATIVE Final   Bilirubin Urine 03/15/2023 NEGATIVE  NEGATIVE Final   Ketones, ur 03/15/2023 NEGATIVE  NEGATIVE mg/dL Final   Protein, ur 30/16/0109 NEGATIVE  NEGATIVE mg/dL Final   Nitrite 32/35/5732 NEGATIVE  NEGATIVE Final   Leukocytes,Ua 03/15/2023 NEGATIVE  NEGATIVE Final   RBC / HPF 03/15/2023 0  0 - 5 RBC/hpf Final   WBC, UA 03/15/2023 0  0 - 5 WBC/hpf Final   Bacteria, UA 03/15/2023 NONE SEEN  NONE SEEN Final   Squamous Epithelial / HPF 03/15/2023 0  0 - 5 /HPF Final  Mucus 03/15/2023 PRESENT   Final   Amorphous  Crystal 03/15/2023 PRESENT   Final   Performed at [facility], [address] [place], [place] [address]   MRSA, PCR 03/15/2023 NEGATIVE  NEGATIVE Final   Staphylococcus aureus 03/15/2023 NEGATIVE  NEGATIVE Final   Comment: (NOTE) The Xpert SA Assay (FDA approved for NASAL specimens in patients 110 years of age and older), is one component of a comprehensive surveillance program. It is not intended to diagnose infection nor to guide or monitor treatment. Performed at [facility], [address] [place], [place] [address]     ECG: Date: 03/16/2023  Time ECG obtained: 1544 PM Rate: 72 bpm Rhythm: normal sinus Axis (leads I and aVF): normal Intervals: PR 166 ms. QRS 68 ms. QTc 431 ms. ST segment and T wave changes: No evidence of acute T wave abnormalities or significant ST segment elevation or depression.  Evidence of a possible, age undetermined, prior infarct:  Yes; inferior Comparison: Similar to previous tracing obtained on 11/03/2021   IMAGING / PROCEDURES: CT SHOULDER LEFT WO CONTRAST performed on 03/15/2023 Severe osteoarthritis of the glenohumeral joint. Erosive changes along the posterior aspect of the glenohumeral joint which may reflect underlying inflammatory or crystalline arthropathy. Severe atrophy of the infraspinatus muscle. Mild supraspinatus muscle atrophy. Severe arthropathy of the acromioclavicular joint. Aortic atherosclerosis   TRANSTHORACIC ECHOCARDIOGRAM performed on 02/03/2022 Left ventricular ejection fraction, by estimation, is 60 to 65%. The left ventricle has normal function. The left ventricle has no regional wall motion abnormalities. Left ventricular diastolic parameters are  consistent with Grade I diastolic dysfunction (impaired relaxation).  Right ventricular systolic function is normal. The right ventricular size is normal.  Left atrial size was mildly dilated.  The mitral valve is normal in structure. No evidence of  mitral valve regurgitation. No evidence of mitral stenosis.  The aortic valve is normal in structure. There is moderate calcification of the aortic valve. Aortic valve regurgitation is not visualized. Mild to moderate aortic valve stenosis. Aortic valve area, by  VTI measures 2.29 cm. Aortic valve mean gradient measures 16.0 mmHg. Aortic valve Vmax measures 2.73 m/s.  The inferior vena cava is normal in size with greater than 50% respiratory variability, suggesting right atrial pressure of 3 mmHg.   RIGHT/LEFT HEART CATHETERIZATION AND CORONARY ANGIOGRAPHY performed on 11/05/2020 Normal left ventricular systolic function Moderate two-vessel coronary artery disease involving proximal LAD and proximal right coronary artery. The coronary arteries are moderately to severely calcified especially the proximal portion of the LAD. The stenosis in the proximal LAD was interrogated with fractional flow reserve and was not significant with an IFR ratio of 0.93.  Mid Cx lesion is 30% stenosed. Dist LAD lesion is 50% stenosed. Prox LAD lesion is 60% stenosed. Mid LAD lesion is 30% stenosed. Ost LM to Mid LM lesion is 30% stenosed. Prox RCA lesion is 65% stenosed. Dist RCA lesion is 30% stenosed. RPDA lesion is 50% stenosed. Mild aortic stenosis with a peak transvalvular gradient of 12 to 15 mmHg Hemodynamics Mean PA = 23 mmHg Mean PCWP = 17 mmHg AO saturation = 97.7% CO = 4.88 L/min CI = 2.25 L/min/m Recommendations AVOID catheterization via the right radial artery in the future due to significant stenosis in the proximal right radial artery as well as severe tortuosity throughout the arterial system in the right upper extremity.   Impression and Plan:  Jared Baldwin has been referred for pre-anesthesia review and clearance prior to him undergoing the planned  anesthetic and procedural courses. Available labs, pertinent testing, and imaging results were personally reviewed by me in preparation for  upcoming operative/procedural course. [facility] medical record has been updated following extensive record review and patient interview with PAT staff.   This patient has been appropriately cleared by cardiology with an overall ACCEPTABLE risk of experiencing significant perioperative cardiovascular complications. Based on clinical review performed today (03/19/23), barring any significant acute changes in the patient's overall condition, it is anticipated that he will be able to proceed with the planned surgical intervention. Any acute changes in clinical condition may necessitate his procedure being postponed and/or cancelled. Patient will meet with anesthesia team (MD and/or CRNA) on the day of his procedure for preoperative evaluation/assessment. Questions regarding anesthetic course will be fielded at that time.   Pre-surgical instructions were reviewed with the patient during his PAT appointment, and questions were fielded to satisfaction by PAT clinical staff. He has been instructed on which medications that he will need to hold prior to surgery, as well as the ones that have been deemed safe/appropriate to take on the day of his procedure. As part of the general education provided by PAT, patient made aware both verbally and in writing, that he would need to abstain from the use of any illegal substances during his perioperative course. He was advised that failure to follow the provided instructions could necessitate case cancellation or result in serious perioperative complications up to and including death. Patient encouraged to contact PAT and/or his surgeon's office to discuss any questions or concerns that may arise prior to surgery; verbalized understanding.   Quentin Mulling, MSN, APRN, FNP-C, CEN [facility]  Perioperative Services Nurse Practitioner Phone: [phone] Fax: [phone] 03/19/23 4:37 PM  NOTE: This note has been prepared using Dragon dictation  software. Despite my best ability to proofread, there is always the potential that unintentional transcriptional errors may still occur from this process.

## 2023-03-22 ENCOUNTER — Ambulatory Visit

## 2023-03-22 ENCOUNTER — Ambulatory Visit: Payer: Self-pay | Admitting: Urgent Care

## 2023-03-22 ENCOUNTER — Ambulatory Visit
Admission: RE | Admit: 2023-03-22 | Discharge: 2023-03-23 | Disposition: A | Discharge status: Home / Self Care | Source: Ambulatory Visit | Attending: Surgery | Admitting: Surgery

## 2023-03-22 ENCOUNTER — Encounter
Admission: RE | Disposition: A | Discharge status: Home / Self Care | Payer: Self-pay | Source: Ambulatory Visit | Attending: Surgery

## 2023-03-22 ENCOUNTER — Other Ambulatory Visit: Payer: Self-pay

## 2023-03-22 ENCOUNTER — Encounter: Payer: Self-pay | Admitting: Surgery

## 2023-03-22 DIAGNOSIS — I2510 Atherosclerotic heart disease of native coronary artery without angina pectoris: Secondary | ICD-10-CM | POA: Diagnosis not present

## 2023-03-22 DIAGNOSIS — I25110 Atherosclerotic heart disease of native coronary artery with unstable angina pectoris: Secondary | ICD-10-CM | POA: Diagnosis not present

## 2023-03-22 DIAGNOSIS — M7522 Bicipital tendinitis, left shoulder: Secondary | ICD-10-CM | POA: Diagnosis not present

## 2023-03-22 DIAGNOSIS — M7582 Other shoulder lesions, left shoulder: Secondary | ICD-10-CM | POA: Diagnosis not present

## 2023-03-22 DIAGNOSIS — I350 Nonrheumatic aortic (valve) stenosis: Secondary | ICD-10-CM | POA: Diagnosis not present

## 2023-03-22 DIAGNOSIS — E785 Hyperlipidemia, unspecified: Secondary | ICD-10-CM | POA: Insufficient documentation

## 2023-03-22 DIAGNOSIS — I272 Pulmonary hypertension, unspecified: Secondary | ICD-10-CM | POA: Diagnosis not present

## 2023-03-22 DIAGNOSIS — Z471 Aftercare following joint replacement surgery: Secondary | ICD-10-CM | POA: Diagnosis not present

## 2023-03-22 DIAGNOSIS — M19012 Primary osteoarthritis, left shoulder: Secondary | ICD-10-CM | POA: Insufficient documentation

## 2023-03-22 DIAGNOSIS — Z87891 Personal history of nicotine dependence: Secondary | ICD-10-CM | POA: Insufficient documentation

## 2023-03-22 DIAGNOSIS — Z79899 Other long term (current) drug therapy: Secondary | ICD-10-CM | POA: Insufficient documentation

## 2023-03-22 DIAGNOSIS — N1831 Chronic kidney disease, stage 3a: Secondary | ICD-10-CM | POA: Insufficient documentation

## 2023-03-22 DIAGNOSIS — E039 Hypothyroidism, unspecified: Secondary | ICD-10-CM | POA: Insufficient documentation

## 2023-03-22 DIAGNOSIS — I129 Hypertensive chronic kidney disease with stage 1 through stage 4 chronic kidney disease, or unspecified chronic kidney disease: Secondary | ICD-10-CM | POA: Diagnosis not present

## 2023-03-22 DIAGNOSIS — M19212 Secondary osteoarthritis, left shoulder: Secondary | ICD-10-CM | POA: Diagnosis not present

## 2023-03-22 DIAGNOSIS — Z96612 Presence of left artificial shoulder joint: Secondary | ICD-10-CM | POA: Diagnosis not present

## 2023-03-22 DIAGNOSIS — G8918 Other acute postprocedural pain: Secondary | ICD-10-CM | POA: Diagnosis not present

## 2023-03-22 HISTORY — PX: REVERSE SHOULDER ARTHROPLASTY: SHX5054

## 2023-03-22 HISTORY — DX: Male erectile dysfunction, unspecified: N52.9

## 2023-03-22 HISTORY — DX: Biliary acute pancreatitis without necrosis or infection: K85.10

## 2023-03-22 HISTORY — DX: Atherosclerosis of aorta: I70.0

## 2023-03-22 HISTORY — DX: Long term (current) use of aspirin: Z79.82

## 2023-03-22 HISTORY — DX: Other ill-defined heart diseases: I51.89

## 2023-03-22 SURGERY — ARTHROPLASTY, SHOULDER, TOTAL, REVERSE
Anesthesia: General | Site: Shoulder | Laterality: Left

## 2023-03-22 MED ORDER — ACETAMINOPHEN 10 MG/ML IV SOLN: 1000.0000 mg | Freq: Once | INTRAVENOUS | Status: DC | PRN

## 2023-03-22 MED ORDER — PHENYLEPHRINE HCL (PRESSORS) 10 MG/ML IV SOLN
INTRAVENOUS | Status: AC
  Filled 2023-03-22: qty 1

## 2023-03-22 MED ORDER — MIDAZOLAM HCL 2 MG/2ML IJ SOLN
INTRAMUSCULAR | Status: AC
  Filled 2023-03-22: qty 2

## 2023-03-22 MED ORDER — MAGNESIUM HYDROXIDE 400 MG/5ML PO SUSP: 30.0000 mL | Freq: Every day | ORAL | Status: DC | PRN

## 2023-03-22 MED ORDER — PHENYLEPHRINE HCL-NACL 20-0.9 MG/250ML-% IV SOLN
INTRAVENOUS | Status: AC
  Filled 2023-03-22: qty 250

## 2023-03-22 MED ORDER — EPHEDRINE SULFATE-NACL 50-0.9 MG/10ML-% IV SOSY
PREFILLED_SYRINGE | INTRAVENOUS | Status: DC | PRN
  Administered 2023-03-22: 10 mg via INTRAVENOUS
  Administered 2023-03-22: 5 mg via INTRAVENOUS

## 2023-03-22 MED ORDER — TRAZODONE HCL 50 MG PO TABS: 50.0000 mg | ORAL_TABLET | Freq: Every evening | ORAL | Status: DC | PRN

## 2023-03-22 MED ORDER — ASPIRIN 81 MG PO TBEC
81.0000 mg | DELAYED_RELEASE_TABLET | Freq: Every day | ORAL | Status: DC
  Administered 2023-03-22 – 2023-03-23 (×2): 81 mg via ORAL
  Filled 2023-03-22 (×2): qty 1

## 2023-03-22 MED ORDER — KETOROLAC TROMETHAMINE 15 MG/ML IJ SOLN
7.5000 mg | Freq: Four times a day (QID) | INTRAMUSCULAR | Status: AC
  Administered 2023-03-22 – 2023-03-23 (×4): 7.5 mg via INTRAVENOUS
  Filled 2023-03-22 (×3): qty 1

## 2023-03-22 MED ORDER — CHLORTHALIDONE 25 MG PO TABS: 25.0000 mg | ORAL_TABLET | Freq: Every day | ORAL | Status: DC

## 2023-03-22 MED ORDER — FLEET ENEMA RE ENEM: 1.0000 | ENEMA | Freq: Once | RECTAL | Status: DC | PRN

## 2023-03-22 MED ORDER — ONDANSETRON HCL 4 MG PO TABS
4.0000 mg | ORAL_TABLET | Freq: Four times a day (QID) | ORAL | Status: DC | PRN
Start: 2023-03-22 — End: 2023-03-23

## 2023-03-22 MED ORDER — DOCUSATE SODIUM 100 MG PO CAPS
100.0000 mg | ORAL_CAPSULE | Freq: Two times a day (BID) | ORAL | Status: DC
  Administered 2023-03-22 (×2): 100 mg via ORAL
  Filled 2023-03-22 (×3): qty 1

## 2023-03-22 MED ORDER — PHENYLEPHRINE HCL-NACL 20-0.9 MG/250ML-% IV SOLN
INTRAVENOUS | Status: DC | PRN
Start: 2023-03-22 — End: 2023-03-22
  Administered 2023-03-22: 30 ug/min via INTRAVENOUS

## 2023-03-22 MED ORDER — FENTANYL CITRATE PF 50 MCG/ML IJ SOSY
50.0000 ug | PREFILLED_SYRINGE | Freq: Once | INTRAMUSCULAR | Status: AC
  Administered 2023-03-22: 50 ug via INTRAVENOUS

## 2023-03-22 MED ORDER — METOCLOPRAMIDE HCL 5 MG PO TABS: 5.0000 mg | ORAL_TABLET | Freq: Three times a day (TID) | ORAL | Status: DC | PRN

## 2023-03-22 MED ORDER — ONDANSETRON HCL 4 MG/2ML IJ SOLN
INTRAMUSCULAR | Status: AC
  Filled 2023-03-22: qty 2

## 2023-03-22 MED ORDER — PHENYLEPHRINE 80 MCG/ML (10ML) SYRINGE FOR IV PUSH (FOR BLOOD PRESSURE SUPPORT)
PREFILLED_SYRINGE | INTRAVENOUS | Status: AC
  Filled 2023-03-22: qty 10

## 2023-03-22 MED ORDER — TRAMADOL HCL 50 MG PO TABS: 50.0000 mg | ORAL_TABLET | Freq: Four times a day (QID) | ORAL | Status: DC | PRN

## 2023-03-22 MED ORDER — SODIUM CHLORIDE 0.9 % IR SOLN
Status: DC | PRN
  Administered 2023-03-22: 3000 mL

## 2023-03-22 MED ORDER — BUPIVACAINE LIPOSOME 1.3 % IJ SUSP
INTRAMUSCULAR | Status: AC
Start: 2023-03-22 — End: ?
  Filled 2023-03-22: qty 20

## 2023-03-22 MED ORDER — BUPIVACAINE-EPINEPHRINE (PF) 0.5% -1:200000 IJ SOLN
INTRAMUSCULAR | Status: DC | PRN
  Administered 2023-03-22: 30 mL

## 2023-03-22 MED ORDER — DEXAMETHASONE SODIUM PHOSPHATE 10 MG/ML IJ SOLN
INTRAMUSCULAR | Status: AC
  Filled 2023-03-22: qty 1

## 2023-03-22 MED ORDER — OXYCODONE HCL 5 MG/5ML PO SOLN: 5.0000 mg | Freq: Once | ORAL | Status: DC | PRN

## 2023-03-22 MED ORDER — CHLORHEXIDINE GLUCONATE 0.12 % MT SOLN
15.0000 mL | Freq: Once | OROMUCOSAL | Status: AC
  Administered 2023-03-22: 15 mL via OROMUCOSAL

## 2023-03-22 MED ORDER — BUPIVACAINE HCL (PF) 0.5 % IJ SOLN
INTRAMUSCULAR | Status: DC | PRN
  Administered 2023-03-22: 10 mL via PERINEURAL

## 2023-03-22 MED ORDER — ENOXAPARIN SODIUM 40 MG/0.4ML IJ SOSY
40.0000 mg | PREFILLED_SYRINGE | INTRAMUSCULAR | Status: DC
  Administered 2023-03-23: 40 mg via SUBCUTANEOUS
  Filled 2023-03-22: qty 0.4

## 2023-03-22 MED ORDER — DROPERIDOL 2.5 MG/ML IJ SOLN: 0.6250 mg | Freq: Once | INTRAMUSCULAR | Status: DC | PRN

## 2023-03-22 MED ORDER — METOCLOPRAMIDE HCL 5 MG/ML IJ SOLN: 5.0000 mg | Freq: Three times a day (TID) | INTRAMUSCULAR | Status: DC | PRN

## 2023-03-22 MED ORDER — CEFAZOLIN SODIUM-DEXTROSE 2-4 GM/100ML-% IV SOLN
2.0000 g | Freq: Four times a day (QID) | INTRAVENOUS | Status: AC
  Administered 2023-03-22 (×2): 2 g via INTRAVENOUS
  Filled 2023-03-22 (×2): qty 100

## 2023-03-22 MED ORDER — MIDAZOLAM HCL 2 MG/2ML IJ SOLN
INTRAMUSCULAR | Status: DC | PRN
  Administered 2023-03-22 (×2): 1 mg via INTRAVENOUS

## 2023-03-22 MED ORDER — PANTOPRAZOLE SODIUM 40 MG PO TBEC
40.0000 mg | DELAYED_RELEASE_TABLET | Freq: Every day | ORAL | Status: DC
  Administered 2023-03-23: 40 mg via ORAL
  Filled 2023-03-22: qty 1

## 2023-03-22 MED ORDER — PHENYLEPHRINE 80 MCG/ML (10ML) SYRINGE FOR IV PUSH (FOR BLOOD PRESSURE SUPPORT)
PREFILLED_SYRINGE | INTRAVENOUS | Status: DC | PRN
  Administered 2023-03-22: 80 ug via INTRAVENOUS

## 2023-03-22 MED ORDER — KETOROLAC TROMETHAMINE 15 MG/ML IJ SOLN
INTRAMUSCULAR | Status: AC
  Filled 2023-03-22: qty 1

## 2023-03-22 MED ORDER — OXYCODONE HCL 5 MG PO TABS: 5.0000 mg | ORAL_TABLET | Freq: Once | ORAL | Status: DC | PRN

## 2023-03-22 MED ORDER — BUPROPION HCL ER (XL) 150 MG PO TB24
150.0000 mg | ORAL_TABLET | Freq: Every day | ORAL | Status: DC
  Administered 2023-03-23: 150 mg via ORAL
  Filled 2023-03-22 (×2): qty 1

## 2023-03-22 MED ORDER — LEVOTHYROXINE SODIUM 88 MCG PO TABS
88.0000 ug | ORAL_TABLET | Freq: Every day | ORAL | Status: DC
  Administered 2023-03-23: 88 ug via ORAL
  Filled 2023-03-22: qty 1

## 2023-03-22 MED ORDER — SEVOFLURANE IN SOLN
RESPIRATORY_TRACT | Status: AC
  Filled 2023-03-22: qty 250

## 2023-03-22 MED ORDER — TRANEXAMIC ACID-NACL 1000-0.7 MG/100ML-% IV SOLN
1000.0000 mg | INTRAVENOUS | Status: AC
  Administered 2023-03-22: 1000 mg via INTRAVENOUS

## 2023-03-22 MED ORDER — BUPIVACAINE HCL (PF) 0.5 % IJ SOLN
INTRAMUSCULAR | Status: AC
  Filled 2023-03-22: qty 10

## 2023-03-22 MED ORDER — DEXAMETHASONE SODIUM PHOSPHATE 10 MG/ML IJ SOLN
INTRAMUSCULAR | Status: DC | PRN
  Administered 2023-03-22: 10 mg via INTRAVENOUS

## 2023-03-22 MED ORDER — MIDAZOLAM HCL 2 MG/2ML IJ SOLN
INTRAMUSCULAR | Status: AC
Start: 2023-03-22 — End: ?
  Filled 2023-03-22: qty 2

## 2023-03-22 MED ORDER — BUPIVACAINE LIPOSOME 1.3 % IJ SUSP
INTRAMUSCULAR | Status: DC | PRN
  Administered 2023-03-22: 20 mL via PERINEURAL

## 2023-03-22 MED ORDER — ROCURONIUM BROMIDE 100 MG/10ML IV SOLN
INTRAVENOUS | Status: DC | PRN
  Administered 2023-03-22: 40 mg via INTRAVENOUS

## 2023-03-22 MED ORDER — LIDOCAINE HCL (CARDIAC) PF 100 MG/5ML IV SOSY
PREFILLED_SYRINGE | INTRAVENOUS | Status: DC | PRN
  Administered 2023-03-22: 100 mg via INTRAVENOUS

## 2023-03-22 MED ORDER — FENTANYL CITRATE (PF) 100 MCG/2ML IJ SOLN
INTRAMUSCULAR | Status: DC | PRN
  Administered 2023-03-22 (×2): 50 ug via INTRAVENOUS

## 2023-03-22 MED ORDER — DIPHENHYDRAMINE HCL 12.5 MG/5ML PO ELIX: 12.5000 mg | ORAL_SOLUTION | ORAL | Status: DC | PRN

## 2023-03-22 MED ORDER — LOSARTAN POTASSIUM 50 MG PO TABS
50.0000 mg | ORAL_TABLET | Freq: Every day | ORAL | Status: DC
Start: 2023-03-22 — End: 2023-03-23
  Filled 2023-03-22 (×2): qty 1

## 2023-03-22 MED ORDER — TRAZODONE HCL 50 MG PO TABS
50.0000 mg | ORAL_TABLET | Freq: Every day | ORAL | Status: DC
  Administered 2023-03-22: 50 mg via ORAL
  Filled 2023-03-22: qty 1

## 2023-03-22 MED ORDER — LATANOPROST 0.005 % OP SOLN
1.0000 [drp] | Freq: Every day | OPHTHALMIC | Status: DC
  Administered 2023-03-22: 1 [drp] via OPHTHALMIC
  Filled 2023-03-22: qty 2.5

## 2023-03-22 MED ORDER — FENTANYL CITRATE (PF) 100 MCG/2ML IJ SOLN
INTRAMUSCULAR | Status: AC
  Filled 2023-03-22: qty 2

## 2023-03-22 MED ORDER — ONDANSETRON HCL 4 MG/2ML IJ SOLN
4.0000 mg | Freq: Four times a day (QID) | INTRAMUSCULAR | Status: DC | PRN
Start: 2023-03-22 — End: 2023-03-23

## 2023-03-22 MED ORDER — ACETAMINOPHEN 325 MG PO TABS: 325.0000 mg | ORAL_TABLET | Freq: Four times a day (QID) | ORAL | Status: DC | PRN

## 2023-03-22 MED ORDER — BUPIVACAINE-EPINEPHRINE (PF) 0.5% -1:200000 IJ SOLN
INTRAMUSCULAR | Status: AC
  Filled 2023-03-22: qty 30

## 2023-03-22 MED ORDER — ONDANSETRON HCL 4 MG/2ML IJ SOLN
INTRAMUSCULAR | Status: DC | PRN
  Administered 2023-03-22: 4 mg via INTRAVENOUS

## 2023-03-22 MED ORDER — PROPOFOL 10 MG/ML IV BOLUS
INTRAVENOUS | Status: AC
  Filled 2023-03-22: qty 20

## 2023-03-22 MED ORDER — HYDROCHLOROTHIAZIDE 25 MG PO TABS
25.0000 mg | ORAL_TABLET | Freq: Every day | ORAL | Status: DC
  Filled 2023-03-22 (×2): qty 1

## 2023-03-22 MED ORDER — MIDAZOLAM HCL 2 MG/2ML IJ SOLN
1.0000 mg | INTRAMUSCULAR | Status: DC | PRN
  Administered 2023-03-22: 1 mg via INTRAVENOUS

## 2023-03-22 MED ORDER — EZETIMIBE 10 MG PO TABS
10.0000 mg | ORAL_TABLET | Freq: Every day | ORAL | Status: DC
Start: 2023-03-23 — End: 2023-03-23
  Administered 2023-03-23: 10 mg via ORAL
  Filled 2023-03-22: qty 1

## 2023-03-22 MED ORDER — ORAL CARE MOUTH RINSE: 15.0000 mL | Freq: Once | OROMUCOSAL | Status: AC

## 2023-03-22 MED ORDER — ROCURONIUM BROMIDE 10 MG/ML (PF) SYRINGE
PREFILLED_SYRINGE | INTRAVENOUS | Status: AC
  Filled 2023-03-22: qty 10

## 2023-03-22 MED ORDER — SUGAMMADEX SODIUM 200 MG/2ML IV SOLN
INTRAVENOUS | Status: AC
Start: 2023-03-22 — End: ?
  Filled 2023-03-22: qty 2

## 2023-03-22 MED ORDER — FENTANYL CITRATE PF 50 MCG/ML IJ SOSY
PREFILLED_SYRINGE | INTRAMUSCULAR | Status: AC
  Filled 2023-03-22: qty 1

## 2023-03-22 MED ORDER — GABAPENTIN 100 MG PO CAPS
200.0000 mg | ORAL_CAPSULE | Freq: Three times a day (TID) | ORAL | Status: DC
  Administered 2023-03-22: 200 mg via ORAL
  Filled 2023-03-22 (×3): qty 2

## 2023-03-22 MED ORDER — FENTANYL CITRATE (PF) 100 MCG/2ML IJ SOLN: 25.0000 ug | INTRAMUSCULAR | Status: DC | PRN

## 2023-03-22 MED ORDER — ACETAMINOPHEN 500 MG PO TABS
500.0000 mg | ORAL_TABLET | Freq: Four times a day (QID) | ORAL | Status: AC
  Administered 2023-03-22 – 2023-03-23 (×4): 500 mg via ORAL
  Filled 2023-03-22 (×4): qty 1

## 2023-03-22 MED ORDER — PROPOFOL 10 MG/ML IV BOLUS
INTRAVENOUS | Status: DC | PRN
  Administered 2023-03-22: 170 mg via INTRAVENOUS

## 2023-03-22 MED ORDER — CEFAZOLIN SODIUM-DEXTROSE 2-4 GM/100ML-% IV SOLN
INTRAVENOUS | Status: AC
  Filled 2023-03-22: qty 100

## 2023-03-22 MED ORDER — CHLORHEXIDINE GLUCONATE 0.12 % MT SOLN
OROMUCOSAL | Status: AC
Start: 2023-03-22 — End: ?
  Filled 2023-03-22: qty 15

## 2023-03-22 MED ORDER — CEFAZOLIN SODIUM-DEXTROSE 2-4 GM/100ML-% IV SOLN
2.0000 g | INTRAVENOUS | Status: AC
  Administered 2023-03-22: 2 g via INTRAVENOUS

## 2023-03-22 MED ORDER — LACTATED RINGERS IV SOLN: INTRAVENOUS | Status: DC

## 2023-03-22 MED ORDER — BISACODYL 10 MG RE SUPP: 10.0000 mg | Freq: Every day | RECTAL | Status: DC | PRN

## 2023-03-22 MED ORDER — TRANEXAMIC ACID-NACL 1000-0.7 MG/100ML-% IV SOLN
INTRAVENOUS | Status: AC
  Filled 2023-03-22: qty 100

## 2023-03-22 MED ORDER — MIRABEGRON ER 50 MG PO TB24
50.0000 mg | ORAL_TABLET | Freq: Every day | ORAL | Status: DC
Start: 2023-03-22 — End: 2023-03-22

## 2023-03-22 SURGICAL SUPPLY — 71 items
BIT DRILL 2.7 W/STOP DISP (BIT) IMPLANT
BIT DRILL TWIST 2.7 (BIT) IMPLANT
BLADE SAW SAG 25X90X1.19 (BLADE) ×1 IMPLANT
BUR 4X55 1 (BURR) IMPLANT
CHLORAPREP W/TINT 26 (MISCELLANEOUS) ×1 IMPLANT
COMP HUM SHLD NEUT XEXT +0 (Shoulder) IMPLANT
COMP HUM UNI IDENTI 40 +3 (Joint) IMPLANT
COMP REV AUG LG W/TAPER/GLENOI (Joint) ×1 IMPLANT
COMPONENT RV AUG LG W/TAPR/GLN (Joint) IMPLANT
COOLER POLAR GLACIER W/PUMP (MISCELLANEOUS) ×1 IMPLANT
DRAPE INCISE IOBAN 66X45 STRL (DRAPES) ×1 IMPLANT
DRAPE SHEET LG 3/4 BI-LAMINATE (DRAPES) ×1 IMPLANT
DRAPE TABLE BACK 80X90 (DRAPES) ×1 IMPLANT
DRSG OPSITE POSTOP 4X8 (GAUZE/BANDAGES/DRESSINGS) ×1 IMPLANT
ELECT BLADE 4.0 EZ CLEAN MEGAD (MISCELLANEOUS) ×1 IMPLANT
ELECT CAUTERY BLADE 6.4 (BLADE) ×1 IMPLANT
ELECT REM PT RETURN 9FT ADLT (ELECTROSURGICAL) ×1 IMPLANT
ELECTRODE BLDE 4.0 EZ CLN MEGD (MISCELLANEOUS) ×1 IMPLANT
ELECTRODE REM PT RTRN 9FT ADLT (ELECTROSURGICAL) ×1 IMPLANT
GAUZE XEROFORM 1X8 LF (GAUZE/BANDAGES/DRESSINGS) ×1 IMPLANT
GLENOSPHERE VERSADIAL 40 +3 (Joint) IMPLANT
GLOVE BIO SURGEON STRL SZ7.5 (GLOVE) ×4 IMPLANT
GLOVE BIO SURGEON STRL SZ8 (GLOVE) ×4 IMPLANT
GLOVE BIOGEL PI IND STRL 8 (GLOVE) ×2 IMPLANT
GLOVE INDICATOR 8.0 STRL GRN (GLOVE) ×1 IMPLANT
GOWN STRL REUS W/ TWL LRG LVL3 (GOWN DISPOSABLE) ×2 IMPLANT
GOWN STRL REUS W/ TWL XL LVL3 (GOWN DISPOSABLE) ×1 IMPLANT
GUIDE RSA SHLD BM ROT L SU (ORTHOPEDIC DISPOSABLE SUPPLIES) IMPLANT
HANDLE YANKAUER SUCT OPEN TIP (MISCELLANEOUS) ×1 IMPLANT
HOOD PEEL AWAY T7 (MISCELLANEOUS) ×3 IMPLANT
KIT STABILIZATION SHOULDER (MISCELLANEOUS) ×1 IMPLANT
KIT TURNOVER KIT A (KITS) ×1 IMPLANT
MANIFOLD NEPTUNE II (INSTRUMENTS) ×1 IMPLANT
MASK FACE SPIDER DISP (MASK) ×1 IMPLANT
MAT ABSORB FLUID 56X50 GRAY (MISCELLANEOUS) ×1 IMPLANT
NDL MAYO CATGUT SZ1 (NEEDLE) IMPLANT
NDL SAFETY ECLIPSE 18X1.5 (NEEDLE) ×1 IMPLANT
NDL SPNL 20GX3.5 QUINCKE YW (NEEDLE) ×1 IMPLANT
NEEDLE MAYO CATGUT SZ1 (NEEDLE) IMPLANT
NEEDLE SPNL 20GX3.5 QUINCKE YW (NEEDLE) ×1 IMPLANT
PACK ARTHROSCOPY SHOULDER (MISCELLANEOUS) ×1 IMPLANT
PAD ARMBOARD POSITIONER FOAM (MISCELLANEOUS) ×1 IMPLANT
PAD WRAPON POLAR SHDR UNIV (MISCELLANEOUS) ×1 IMPLANT
PIN THREADED REVERSE (PIN) IMPLANT
PULSAVAC PLUS IRRIG FAN TIP (DISPOSABLE) ×1 IMPLANT
REAMER GUIDE BUSHING SURG DISP (MISCELLANEOUS) IMPLANT
REAMER GUIDE W/SCREW AUG (MISCELLANEOUS) IMPLANT
SCREW BONE STRL 6.5MMX25MM (Screw) IMPLANT
SCREW LOCKING 4.75MMX15MM (Screw) IMPLANT
SCREW LOCKING NS 4.75MMX20MM (Screw) IMPLANT
SCREW LOCKING STRL 4.75X25X3.5 (Screw) IMPLANT
SLING ULTRA II LG (MISCELLANEOUS) IMPLANT
SLING ULTRA II M (MISCELLANEOUS) IMPLANT
SOL .9 NS 3000ML IRR UROMATIC (IV SOLUTION) ×1 IMPLANT
SPONGE T-LAP 18X18 ~~LOC~~+RFID (SPONGE) ×2 IMPLANT
STAPLER SKIN PROX 35W (STAPLE) ×1 IMPLANT
STEM HUMERAL MICRO SZ13 (Stem) IMPLANT
STEM HUMERAL MICRO SZ14 (Stem) IMPLANT
SUT ETHIBOND 0 MO6 C/R (SUTURE) ×1 IMPLANT
SUT FIBERWIRE #2 38 BLUE 1/2 (SUTURE) ×4 IMPLANT
SUT MAXBRAID #2 CVD NDL (SUTURE) IMPLANT
SUT VIC AB 0 CT1 36 (SUTURE) ×1 IMPLANT
SUT VIC AB 2-0 CT1 TAPERPNT 27 (SUTURE) ×2 IMPLANT
SUTURE FIBERWR #2 38 BLUE 1/2 (SUTURE) ×4 IMPLANT
SYR 10ML LL (SYRINGE) ×1 IMPLANT
SYR 30ML LL (SYRINGE) ×1 IMPLANT
SYR TOOMEY 50ML (SYRINGE) ×1 IMPLANT
TIP FAN IRRIG PULSAVAC PLUS (DISPOSABLE) ×1 IMPLANT
TRAP FLUID SMOKE EVACUATOR (MISCELLANEOUS) ×1 IMPLANT
WATER STERILE IRR 500ML POUR (IV SOLUTION) ×1 IMPLANT
WRAPON POLAR PAD SHDR UNIV (MISCELLANEOUS) ×1 IMPLANT

## 2023-03-22 NOTE — Anesthesia Procedure Notes (Signed)
 Procedure Name: Intubation Date/Time: 03/22/2023 10:37 AM  Performed by: Emeterio Reeve, CRNAPre-anesthesia Checklist: Patient identified, Emergency Drugs available, Suction available and Patient being monitored Patient Re-evaluated:Patient Re-evaluated prior to induction Oxygen Delivery Method: Circle system utilized Preoxygenation: Pre-oxygenation with 100% oxygen Induction Type: IV induction Ventilation: Mask ventilation without difficulty Laryngoscope Size: McGrath and 4 Grade View: Grade I Tube type: Oral Tube size: 7.5 mm Number of attempts: 1 Airway Equipment and Method: Stylet and Oral airway Placement Confirmation: ETT inserted through vocal cords under direct vision, positive ETCO2 and breath sounds checked- equal and bilateral Secured at: 23 cm Tube secured with: Tape Dental Injury: Teeth and Oropharynx as per pre-operative assessment  Comments: Cords clear; no trauma. CA

## 2023-03-22 NOTE — Evaluation (Signed)
 Physical Therapy Evaluation Patient Details Name: Jared Baldwin MRN: [id number] DOB: 1945/10/30 Today's Date: 03/22/2023  History of Present Illness  Pt is a 78 y.o. 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  PMH includes htn, pulmonary htn, CAD, chronic back pain, s/p gastric sleeve, pseudophakia B eyes, CKD, back sx, B TKA.  Clinical Impression  Prior to surgery, pt reports being independent with functional mobility; lives with his wife in 1 level home with 2 STE B railings.  No c/o pain during session.  Currently pt is mod assist semi-supine to sitting EOB; mod assist for transfers; and min assist to ambulate a few feet bed to recliner (no AD use).  Pt A&Ox4 but some generalized confusion noted and pt slow to respond intermittently (nurse aware and updated).  Pt would currently benefit from skilled PT to address noted impairments and functional limitations (see below for any additional details).  Upon hospital discharge, pt would benefit from ongoing therapy.     If plan is discharge home, recommend the following: A little help with walking and/or transfers;A little help with bathing/dressing/bathroom;Assistance with cooking/housework;Assist for transportation;Help with stairs or ramp for entrance   Can travel by private vehicle        Equipment Recommendations Other (comment) (TBD)  Recommendations for Other Services       Functional Status Assessment Patient has had a recent decline in their functional status and demonstrates the ability to make significant improvements in function in a reasonable and predictable amount of time.     Precautions / Restrictions Precautions Precautions: Fall;Shoulder Shoulder Interventions: Don joy ultra sling;Shoulder abduction pillow;At all times Recall of Precautions/Restrictions: Impaired Restrictions Weight  Bearing Restrictions Per Provider Order: Yes LUE Weight Bearing Per Provider Order: Non weight bearing Other Position/Activity Restrictions: No weightbearing through left shoulder or upper extremity.  No lifting, pushing, pulling, or carrying more than 5 pounds with the left arm/hand.      Mobility  Bed Mobility Overal bed mobility: Needs Assistance Bed Mobility: Supine to Sit     Supine to sit: Mod assist, HOB elevated     General bed mobility comments: assist for trunk; vc's for technique    Transfers Overall transfer level: Needs assistance   Transfers: Sit to/from Stand Sit to Stand: Mod assist           General transfer comment: able to stand on 2nd attempt from bed and from recliner with mod assist; vc's for R UE and B LE placement/positioning and overall technique; vc's to lean/shift weight forward when standing    Ambulation/Gait Ambulation/Gait assistance: Min assist Gait Distance (Feet): 3 Feet (bed to recliner) Assistive device: None   Gait velocity: decreased     General Gait Details: assist to steady  Stairs            Wheelchair Mobility     Tilt Bed    Modified Rankin (Stroke Patients Only)       Balance Overall balance assessment: Needs assistance Sitting-balance support: No upper extremity supported, Feet supported Sitting balance-Leahy Scale: Fair Sitting balance - Comments: steady static sitting   Standing balance support: No upper extremity supported, During functional activity Standing balance-Leahy Scale: Poor Standing balance comment: assist to steady with standing activities  Pertinent Vitals/Pain Pain Assessment Pain Assessment: No/denies pain Vitals (HR and SpO2 on room air) stable throughout treatment session.    Home Living Family/patient expects to be discharged to:: Private residence Living Arrangements: Spouse/significant other Available Help at Discharge: Family Type of  Home: House Home Access: Stairs to enter Entrance Stairs-Rails: Doctor, general practice of Steps: 2   Home Layout: One level        Prior Function Prior Level of Function : Independent/Modified Independent             Mobility Comments: Fall 3-4 weeks ago (missed step going down steps; didn't turn on lights).  Works part time at [employer].       Extremity/Trunk Assessment   Upper Extremity Assessment Upper Extremity Assessment: LUE deficits/detail LUE Deficits / Details: L UE in sling/immobilizer LUE: Unable to fully assess due to immobilization    Lower Extremity Assessment Lower Extremity Assessment: Generalized weakness       Communication   Communication Communication: No apparent difficulties    Cognition Arousal: Alert Behavior During Therapy: WFL for tasks assessed/performed   PT - Cognitive impairments: Attention, Problem solving, Safety/Judgement, Sequencing, Initiation, Awareness                       PT - Cognition Comments: Pt A&Ox4 but appearing slow to respond at times (pt's wife reports pt still groggy from anesthesia) Following commands: Impaired Following commands impaired: Follows one step commands inconsistently     Cueing Cueing Techniques: Verbal cues, Gestural cues, Visual cues     General Comments General comments (skin integrity, edema, etc.): L UE in sling/immobilizer    Exercises     Assessment/Plan    PT Assessment Patient needs continued PT services  PT Problem List Decreased strength;Decreased range of motion;Decreased activity tolerance;Decreased balance;Decreased mobility;Decreased cognition;Decreased knowledge of use of DME;Decreased safety awareness;Decreased knowledge of precautions;Decreased skin integrity;Pain       PT Treatment Interventions DME instruction;Gait training;Stair training;Functional mobility training;Therapeutic activities;Therapeutic exercise;Balance training;Patient/family  education    PT Goals (Current goals can be found in the Care Plan section)  Acute Rehab PT Goals Patient Stated Goal: to improve walking PT Goal Formulation: With patient Time For Goal Achievement: 04/05/23 Potential to Achieve Goals: Good    Frequency BID     Co-evaluation               AM-PAC PT "6 Clicks" Mobility  Outcome Measure Help needed turning from your back to your side while in a flat bed without using bedrails?: A Little Help needed moving from lying on your back to sitting on the side of a flat bed without using bedrails?: A Lot Help needed moving to and from a bed to a chair (including a wheelchair)?: A Little Help needed standing up from a chair using your arms (e.g., wheelchair or bedside chair)?: A Lot Help needed to walk in hospital room?: A Lot Help needed climbing 3-5 steps with a railing? : A Lot 6 Click Score: 14    End of Session Equipment Utilized During Treatment: Gait belt;Other (comment) (L UE sling/immobilizer) Activity Tolerance: Patient tolerated treatment well Patient left: in chair;with call bell/phone within reach;with chair alarm set;with family/visitor present;with SCD's reapplied;Other (comment) (L UE in sling/immobilizer and supported by pillows) Nurse Communication: Mobility status;Precautions;Weight bearing status PT Visit Diagnosis: Unsteadiness on feet (R26.81);Other abnormalities of gait and mobility (R26.89);Muscle weakness (generalized) (M62.81);History of falling (Z91.81);Pain Pain - Right/Left: Left Pain - part of body: Shoulder  Time: 8295-6213 PT Time Calculation (min) (ACUTE ONLY): 29 min   Charges:   PT Evaluation $PT Eval Low Complexity: 1 Low PT Treatments $Therapeutic Activity: 8-22 mins PT General Charges $$ ACUTE PT VISIT: 1 Visit        Hendricks Limes, PT 03/22/23, 4:59 PM

## 2023-03-22 NOTE — Discharge Summary (Signed)
 Physician Discharge Summary  Patient ID: Jared Baldwin MRN: [id number] DOB/AGE: January 18, 1945 78 y.o.  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, left shoulder   Discharge Diagnoses: Patient Active Problem List   Diagnosis Date Noted   Status post reverse arthroplasty of shoulder, left 03/22/2023   Stage 3a chronic kidney disease (HCC) 03/16/2021   Vitreous membranes and strands, left eye 11/22/2020   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/22/2020   Posterior vitreous detachment, left eye 11/22/2020   Pseudophakia, both eyes 11/22/2020   History of retinal detachment 11/22/2020   Coronary artery disease involving native coronary artery of native heart without angina pectoris 11/15/2020   Aortic valve stenosis    Unstable angina (HCC)    Osteoarthritis of left shoulder 02/21/2017   Gallstone pancreatitis 09/07/2016   Aortic atherosclerosis (HCC) 09/06/2016   Idiopathic chronic pancreatitis (HCC) 09/06/2016   S/P total knee arthroplasty 07/24/2016   B12 deficiency 04/23/2016   S/P bariatric surgery 04/20/2016   Chest pain 02/19/2012   SOB (shortness of breath) 02/19/2012   Claudication of lower extremity (HCC) 02/19/2012   Chronic back pain 02/19/2012   Hyperlipidemia    Hypertension    Recurrent major depression in remission (HCC) 08/28/2011   Morbid obesity (HCC) 10/11/2010    Past Medical History:  Diagnosis Date   Aortic atherosclerosis (HCC)    Aortic stenosis    a. 02/2012 Echo: EF 60-65%, very mild AS; b. 11/2020 Echo: EF 60-65%, GrI DD, no rwma, Nl RV size/fxn, RVSP 23.71mmHg, Mild AS/AI (AoV mean grad ; Vmax 2.24m/s).   Arthritis    B12 deficiency    CAD (coronary artery disease)    11/2020 Cath: LM 30ost/m, LAD 60p (nl iFR @ 0.93), 21m,  50d, LCX 54m, OM1/2 nl, OM3 min irregs, RCA 65p, 30d, RPDA 50. Mild AS w/ peak grad of 12-95mmHg. RHC w/ mild PAH. **Avoid future cath via R Radial due to signif prox R RA stenosis**   Chronic back pain    Depression    Diastolic dysfunction    Erectile dysfunction    a.) on PDE5i (tadalafil)   Gallstone pancreatitis    GERD (gastroesophageal reflux disease)    Hyperlipidemia    Hypertension    Hypothyroidism    Idiopathic chronic pancreatitis (HCC)    Long-term use of aspirin therapy    Obesity    a. s/p gastric sleeve.   Pseudophakia, both eyes    Stage 3a chronic kidney disease (CKD) (HCC)    Unstable angina (HCC)      Transfusion: None.   Consultants (if any):   Discharged Condition: Improved  Hospital Course: Jared Baldwin is an 78 y.o. male who was admitted 03/22/2023 with a diagnosis of severe degenerative joint disease with rotator cuff tendinopathy, left shoulder  and went to the operating room on 03/22/2023 and underwent the above named procedures.    Surgeries: Procedure(s): ARTHROPLASTY, SHOULDER, TOTAL, REVERSE on 03/22/2023 Patient tolerated the surgery well. Taken to PACU where he was stabilized and then transferred to the orthopedic floor.  Continued on aspirin 81mg  daily. Heels elevated on bed with rolled towels. No evidence of DVT. Negative Homan. Physical therapy started on day #1 for gait training and transfer. OT started day #1 for ADL and assisted devices.  Patient's IV was removed on POD1  Implants:  All press-fit [employer] Comprehensive system  with a #13 identity micro-humeral stem, an extra extended neutral +0 Identity humeral tray with a +3 mm insert, and a large augmented mini-base plate with a 40 mm +3 mm lateralized glenosphere.   He was given perioperative antibiotics:  Anti-infectives (From admission, onward)    Start     Dose/Rate Route Frequency Ordered Stop   03/22/23 1700  ceFAZolin (ANCEF) IVPB 2g/100 mL premix        2 g 200 mL/hr  over 30 Minutes Intravenous Every 6 hours 03/22/23 1456 03/22/23 2357   03/22/23 0900  ceFAZolin (ANCEF) IVPB 2g/100 mL premix        2 g 200 mL/hr over 30 Minutes Intravenous On call to O.R. 03/22/23 6644 03/22/23 1046     .  He was given sequential compression devices, early ambulation, and Aspirin for DVT prophylaxis.  He benefited maximally from the hospital stay and there were no complications.    Recent vital signs:  Vitals:   03/22/23 2054 03/23/23 0446  BP: (!) 115/55 (!) 122/59  Pulse: 73 67  Resp: 17 18  Temp: 97.9 F (36.6 C) 97.6 F (36.4 C)  SpO2: 97% 99%    Recent laboratory studies:  Lab Results  Component Value Date   HGB 12.0 (L) 03/23/2023   HGB 12.4 (L) 03/15/2023   HGB 13.5 10/26/2020   Lab Results  Component Value Date   WBC 9.3 03/23/2023   PLT 178 03/23/2023   Lab Results  Component Value Date   INR 1.10 09/07/2016   Lab Results  Component Value Date   NA 137 03/23/2023   K 4.1 03/23/2023   CL 102 03/23/2023   CO2 24 03/23/2023   BUN 27 (H) 03/23/2023   CREATININE 1.34 (H) 03/23/2023   GLUCOSE 112 (H) 03/23/2023    Discharge Medications:   Allergies as of 03/23/2023       Reactions   Lipitor [atorvastatin]    Myalgias   Simvastatin    Myalgias    Statins Other (See Comments)   Muscle pain with atorvastatin and simvastatin        Medication List     TAKE these medications    Acetaminophen-Pamabrom 500-25 MG Tabs Take 1 tablet by mouth daily as needed (pain).   aspirin EC 325 MG tablet Take 1 tablet (325 mg total) by mouth daily. Swallow whole. What changed:  medication strength how much to take   buPROPion 150 MG 24 hr tablet Commonly known as: WELLBUTRIN XL Take 150 mg by mouth daily.   chlorthalidone 25 MG tablet Commonly known as: HYGROTON Take 1 tablet (25 mg total) by mouth daily.   Cyanocobalamin 5000 MCG/ML Liqd Take 5,000 mcg by mouth daily.   ezetimibe 10 MG tablet Commonly known as: ZETIA TAKE 1  TABLET(10 MG) BY MOUTH DAILY   gabapentin 100 MG capsule Commonly known as: NEURONTIN TAKE 2 CAPSULES(200 MG) BY MOUTH THREE TIMES DAILY. DO NOT. STOP MEDICINE ABRUPTLY. DECREASE BY 1 TABLET DAILY TO. STOP   hydrochlorothiazide 25 MG tablet Commonly known as: HYDRODIURIL Take 25 mg by mouth daily.   ibuprofen 200 MG tablet Commonly known as: ADVIL Take 200-400 mg by mouth every 6 (six) hours as needed for moderate pain (pain score 4-6).   levothyroxine 88 MCG tablet Commonly known as: SYNTHROID Take 88 mcg by mouth daily before breakfast.   losartan 50 MG tablet Commonly known as: COZAAR Take 50 mg by mouth daily.   Lumigan 0.01 % Soln Generic drug: bimatoprost Place  1 drop into both eyes at bedtime.   mirabegron ER 50 MG Tb24 tablet Commonly known as: MYRBETRIQ Take 1 tablet (50 mg total) by mouth daily.   omeprazole 40 MG capsule Commonly known as: PRILOSEC Take 40 mg by mouth daily.   tadalafil 5 MG tablet Commonly known as: CIALIS Take 1 tablet (5 mg total) by mouth daily. What changed:  when to take this reasons to take this   traMADol 50 MG tablet Commonly known as: ULTRAM Take 1 tablet (50 mg total) by mouth every 6 (six) hours as needed for severe pain (pain score 7-10).   traZODone 50 MG tablet Commonly known as: DESYREL Take 50 mg by mouth at bedtime.   Vitamin D 50 MCG (2000 UT) tablet Take 2,000 Units by mouth daily.   Wegovy 0.5 MG/0.5ML Soaj Generic drug: Semaglutide-Weight Management Inject 0.5 mg into the skin every Saturday.        Diagnostic Studies: DG Shoulder Left Port Result Date: 03/22/2023 CLINICAL DATA:  [id number] Status post reverse arthroplasty of shoulder, left [id number] EXAM: LEFT SHOULDER COMPARISON:  None Available. FINDINGS: Reverse left shoulder arthroplasty in expected alignment. No periprosthetic lucency or fracture. Recent postsurgical change includes air and edema in the joint space and soft tissues. Overlying skin staples  in place. IMPRESSION: Reverse left shoulder arthroplasty without immediate postoperative complication. Electronically Signed   By: Narda Rutherford M.D.   On: 03/22/2023 15:06   [place] OR NERVE BLOCK-IMAGE ONLY [facility]) Result Date: 03/22/2023 There is no interpretation for this exam.  This order is for images obtained during a surgical procedure.  Please See "Surgeries" Tab for more information regarding the procedure.   Disposition: Discharge disposition: 01-Home or Self Care     Plan for discharge home today pending progress with PT.   Follow-up Information     Anson Oregon, PA-C Follow up in 14 day(s).   Specialty: Physician Assistant Why: Mindi Slicker information: [address] [place] [address] [phone]                Signed: Lenard Forth, Cederick Broadnax PA-C 03/23/2023, 6:28 AM

## 2023-03-22 NOTE — Anesthesia Procedure Notes (Signed)
 Anesthesia Regional Block: Interscalene brachial plexus block   Pre-Anesthetic Checklist: , timeout performed,  Correct Patient, Correct Site, Correct Laterality,  Correct Procedure, Correct Position, site marked,  Risks and benefits discussed,  Surgical consent,  Pre-op evaluation,  At surgeon's request and post-op pain management  Laterality: Left  Prep: chloraprep       Needles:  Injection technique: Single-shot  Needle Type: Stimiplex     Needle Length: 9cm  Needle Gauge: 22     Additional Needles:   Procedures:,,,, ultrasound used (permanent image in chart),,    Narrative:  Start time: 03/22/2023 9:50 AM End time: 03/22/2023 9:53 AM Injection made incrementally with aspirations every 5 mL.  Performed by: Personally  Anesthesiologist: Foye Deer, MD  Additional Notes: Patient consented for risk and benefits of nerve block including but not limited to nerve damage, failed block, bleeding and infection.  Patient voiced understanding.  Functioning IV was confirmed and monitors were applied.  Timeout done prior to procedure and prior to any sedation being given to the patient.  Patient confirmed procedure site prior to any sedation given to the patient. Sterile prep,hand hygiene and sterile gloves were used.  Minimal sedation used for procedure.  No paresthesia endorsed by patient during the procedure.  Negative aspiration and negative test dose prior to incremental administration of local anesthetic. The patient tolerated the procedure well with no immediate complications.

## 2023-03-22 NOTE — Anesthesia Preprocedure Evaluation (Addendum)
 Anesthesia Evaluation  Patient identified by MRN, date of birth, ID band Patient awake    Reviewed: Allergy & Precautions, H&P , NPO status , Patient's Chart, lab work & pertinent test results  Airway Mallampati: III  TM Distance: >3 FB Neck ROM: full    Dental  (+) Edentulous Upper, Edentulous Lower   Pulmonary former smoker   Pulmonary exam normal        Cardiovascular Exercise Tolerance: Good hypertension, pulmonary hypertension (mild pulmonary hypertension)(-) angina + CAD (11/2020 Cath: LM 30ost/m, LAD 60p (nl iFR @ 0.93), 85m, 50d, LCX 54m, OM1/2 nl, OM3 min irregs, RCA 65p, 30d, RPDA 50. Mild AS w/ peak grad of 12-88mmHg. RHC w/ mild PAH.)  (-) Past MI + Valvular Problems/Murmurs AS  + Systolic murmurs **Avoid future cath via R Radial due to signif prox               R RA stenosis*  TTE performed on 02/03/2022 revealed a normal left ventricular systolic function with an EF of 60-65%. There were no regional wall motion abnormalities. Left ventricular diastolic Doppler parameters consistent with abnormal relaxation (G1DD).  Left atrium was mildly dilated.  Right ventricular size and function normal with a TAPSE measuring 2.7 cm  (normal range >/= 1.6 cm).  There was mild mitral annular calcification.  There was no significant valvular regurgitation.  Aortic valve mildly to moderately stenotic with a mean pressure gradient of 16.0 mmHg; AVA (VTI) = 2.29 cm.  Aorta normal in size with no evidence of ectasia or aneurysmal dilatation   Neuro/Psych negative neurological ROS  negative psych ROS   GI/Hepatic negative GI ROS, Neg liver ROS,,,s/p gastric sleeve   Endo/Other  Hypothyroidism    Renal/GU Renal InsufficiencyRenal disease     Musculoskeletal   Abdominal  (+) + obese  Peds  Hematology negative hematology ROS (+)   Anesthesia Other Findings Past Medical History: No date: Aortic atherosclerosis (HCC) No date:  Aortic stenosis     Comment:  a. 02/2012 Echo: EF 60-65%, very mild AS; b. 11/2020               Echo: EF 60-65%, GrI DD, no rwma, Nl RV size/fxn, RVSP               23.26mmHg, Mild AS/AI (AoV mean grad ; Vmax               2.28m/s). No date: Arthritis No date: B12 deficiency No date: CAD (coronary artery disease)     Comment:  11/2020 Cath: LM 30ost/m, LAD 60p (nl iFR @ 0.93), 42m,               50d, LCX 7m, OM1/2 nl, OM3 min irregs, RCA 65p, 30d,               RPDA 50. Mild AS w/ peak grad of 12-53mmHg. RHC w/ mild               PAH. **Avoid future cath via R Radial due to signif prox               R RA stenosis** No date: Chronic back pain No date: Depression No date: Diastolic dysfunction No date: Erectile dysfunction     Comment:  a.) on PDE5i (tadalafil) No date: Gallstone pancreatitis No date: GERD (gastroesophageal reflux disease) No date: Hyperlipidemia No date: Hypertension No date: Hypothyroidism No date: Idiopathic chronic pancreatitis (HCC) No date: Long-term use of aspirin therapy No date: Obesity  Comment:  a. s/p gastric sleeve. No date: Pseudophakia, both eyes No date: Stage 3a chronic kidney disease (CKD) (HCC)  Past Surgical History: No date: BACK SURGERY No date: CATARACT EXTRACTION W/ INTRAOCULAR LENS  IMPLANT, BILATERAL 09/09/2016: CHOLECYSTECTOMY; N/A     Comment:  Procedure: LAPAROSCOPIC CHOLECYSTECTOMY WITH               INTRAOPERATIVE CHOLANGIOGRAM;  Surgeon: Henrene Dodge,               MD;  Location: [facility] ORS;  Service: General;  Laterality:               N/A; 11/05/2020: CORONARY PRESSURE/FFR WITH 3D MAPPING; N/A     Comment:  Procedure: Coronary Pressure Wire/FFR w/3D Mapping;                Surgeon: Iran Ouch, MD;  Location: [facility] INVASIVE               CV LAB;  Service: Cardiovascular;  Laterality: N/A; 07/24/2016: KNEE ARTHROPLASTY; Right     Comment:  Procedure: COMPUTER ASSISTED TOTAL KNEE ARTHROPLASTY;                 Surgeon: Donato Heinz, MD;  Location: [facility] ORS;                Service: Orthopedics;  Laterality: Right; 11/05/2020: RIGHT/LEFT HEART CATH AND CORONARY ANGIOGRAPHY; Bilateral     Comment:  Procedure: RIGHT/LEFT HEART CATH AND CORONARY               ANGIOGRAPHY;  Surgeon: Iran Ouch, MD;  Location:               [facility] INVASIVE CV LAB;  Service: Cardiovascular;                Laterality: Bilateral; No date: TOTAL KNEE ARTHROPLASTY; Bilateral 09/09/2016: UMBILICAL HERNIA REPAIR; N/A     Comment:  Procedure: HERNIA REPAIR UMBILICAL ADULT;  Surgeon:               Henrene Dodge, MD;  Location: [facility] ORS;  Service:               General;  Laterality: N/A;     Reproductive/Obstetrics negative OB ROS                             Anesthesia Physical Anesthesia Plan  ASA: 3  Anesthesia Plan: General ETT   Post-op Pain Management: Regional block*   Induction: Intravenous  PONV Risk Score and Plan: 2 and Ondansetron, Dexamethasone and Midazolam  Airway Management Planned: Oral ETT  Additional Equipment:   Intra-op Plan:   Post-operative Plan: Extubation in OR  Informed Consent: I have reviewed the patients History and Physical, chart, labs and discussed the procedure including the risks, benefits and alternatives for the proposed anesthesia with the patient or authorized representative who has indicated his/her understanding and acceptance.     Dental Advisory Given  Plan Discussed with: CRNA and Surgeon  Anesthesia Plan Comments:         Anesthesia Quick Evaluation

## 2023-03-22 NOTE — H&P (Signed)
 History of Present Illness:  Jared Baldwin is a 78 y.o. male who has severe left shoulder pain. The patient has had ongoing bilateral shoulder pain, notably worse in his left that has persisted for several months. 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 He states that any attempted movement of this shoulder causes discomfort, especially with attempting to raise his hand above his head or put it behind his back. He states that it has affected his ability to sleep at night and perform his ADLs as he would like. He denies having any numbness, tingling or radiation symptoms. He has failed conservative treatment including NSAID's, injections, PT, and activity modification. He has requested operative intervention for relief of his DJD symptoms. He denies any cardiac, pulmonary history. No history of any DVTs or clots. The patient denies having any previous surgeries on this shoulder. The patient is not a diabetic. The patient is left-hand dominant.  Of note, patient and wife have requested that the patient stay 1 night postoperatively after his surgery  Social Hx: Patient lives at home with his wife who will help look after him after surgery. He denies any alcohol use or illicit drug use. No current nicotine or smoking. Patient has a previous history of smoking which he quit in 2001.  Allergies:  Atorvastatin Muscle Pain  Simvastatin Muscle Pain   Current Outpatient Medications:  aspirin 81 MG EC tablet Take 1 tablet (81 mg total) by mouth once daily  buPROPion (WELLBUTRIN XL) 150 MG XL tablet Take 1 tablet (150 mg total) by mouth once daily 30 tablet 11  cyanocobalamin, vitamin B-12, 5,000 mcg/mL Drop Take 1 drop by mouth once daily.   gabapentin (NEURONTIN) 300 MG capsule Take 1 capsule (300 mg total) by mouth 3 (three) times daily 270 capsule 1  levothyroxine (SYNTHROID) 88 MCG tablet Take 1 tablet (88 mcg total) by  mouth once daily Take on an empty stomach with a glass of water at least 30-60 minutes before breakfast. 90 tablet 3  losartan (COZAAR) 50 MG tablet Take 1 tablet (50 mg total) by mouth once daily 90 tablet 1  LUMIGAN 0.01 % ophthalmic solution Place 1 drop into both eyes at bedtime  mirabegron (MYRBETRIQ) 50 mg ER tablet Take 50 mg by mouth once daily  omeprazole (PRILOSEC) 40 MG DR capsule Take 1 capsule (40 mg total) by mouth once daily 90 capsule 3  pilocarpine (ISOPTO CARPINE) 1 % ophthalmic solution Place 2 drops into both eyes 3 (three) times daily  semaglutide (OZEMPIC) 0.25 mg or 0.5 mg (2 mg/3 mL) pen injector Inject 0.375 mLs (0.25 mg total) subcutaneously once a week  traZODone (DESYREL) 50 MG tablet Take 1 tablet (50 mg total) by mouth at bedtime as needed for Sleep 90 tablet 1  vibegron (GEMTESA) 75 mg Tab Take 75 mg by mouth once daily   Past Medical History:  B12 deficiency 04/23/2016  Chronic back pain  Chronic headaches  Erectile dysfunction  GERD (gastroesophageal reflux disease)  Hyperlipidemia  Hypertension   Past Surgical History:  ARTHROPLASTY TOTAL KNEE Left August, 2013  FUSION LUMBAR SPINE ARTHRODESIS ANTERIOR N/A 04/22/2012  Procedure: FUSION LUMBAR SPINE ARTHRODESIS ANTERIOR; Surgeon: Laurelyn Sickle, MD; Location: [facility]; Service: Orthopedics; Laterality: N/A;  APPLICATION VERTERBRAL DEFECT PROSTHETIC DEVICE N/A 04/22/2012  Procedure: APPLICATION VERTERBRAL DEFECT PROSTHETIC DEVICE 22851 x 2; Surgeon: Laurelyn Sickle, MD; Location: [facility]; Service: Orthopedics; Laterality: N/A;  ARTHRODESIS  ANTERIOR LUMBAR SPINE N/A 04/22/2012  Procedure: ARTHRODESIS ANTERIOR LUMBAR SPINE; Surgeon: Laurelyn Sickle, MD; Location: [facility]; Service: Orthopedics; Laterality: N/A;  INSERTION MORSELIZED BONE ALLOGRAFT FOR SPINE SURGERY N/A 04/22/2012  Procedure: INSERTION MORSELIZED BONE ALLOGRAFT FOR SPINE SURGERY;  Surgeon: Laurelyn Sickle, MD; Location: [facility]; Service: Orthopedics; Laterality: N/A;  POSTERIOR LUMBAR SPINE FUSION ONE LEVEL N/A 04/22/2012  Procedure: POSTERIOR LUMBAR SPINE FUSION ONE LEVEL; Surgeon: Laurelyn Sickle, MD; Location: [facility]; Service: Orthopedics; Laterality: N/A;  INSTRUMENTATION POSTERIOR SPINE 3 TO 6 VERTEBRAL SEGMENTS N/A 04/22/2012  Procedure: INSTRUMENTATION POSTERIOR SPINE 3 TO 6 VERTEBRAL SEGMENTS; Surgeon: Laurelyn Sickle, MD; Location: [facility]; Service: Orthopedics; Laterality: N/A;  Right total knee arthroplasty using computer-assisted navigation 07/24/2016 (Dr Ernest Pine)  LAPAROSCOPIC CHOLECYSTECTOMY 09/09/2016  BACK SURGERY  HERNIA REPAIR  OTHER SURGERY (gastric sleeve)  VEIN SURGERY    Physical Exam:  Ht: Wt: BMI: There is no height or weight on file to calculate BMI.  General/Constitutional: No apparent distress: well-nourished and well developed. Eyes: Pupils equal, round with synchronous movement. Lymphatic: No palpable adenopathy. Respiratory: Patient has good chest rise and fall with inspiration and expiration. All lung fields are clear to auscultation bilaterally. There is no Rales, rhonchi or wheezes appreciated. Cardiovascular: Upon auscultation there is a regular rate and rhythm without any murmurs, rubs, gallops or heaves appreciated. There does not appear to be any swelling down the lower extremities. Posterior tibial pulses appreciated bilaterally, 2+. Integumentary: No impressive skin lesions present, except as noted in detailed exam. Neuro/Psych: Normal mood and affect, oriented to person, place and time. Musculoskeletal: see exam below  Left Shoulder: Upon inspection of the patient's left shoulder, there does not appear to be any underlying erythema, swelling or gross deformity appreciated. No tenting of the skin.  Shoulder contour: Normal Tenderness: Subacromial  tenderness. Impingement test: positive Apprehension sign: negative Crepitance: Not appreciated Atrophy: No atrophy. Limited to fair strength with both internal and external rotation of the shoulder against resistance. Increased discomfort Sulcus sign: negative Hawkin's test: Mildly positive Liftoff test: Performed with difficulty Range of Motion:  EXT/FLEX: -/90 active, 100 passive with 4+/5 strength. Does report dramatically increased discomfort with this motion  ADD/ABD: -/80 active, 100 passive with 4/5 strength and increased discomfort  IR/ER: At neutral position with elbow at 90 degrees: able to rotate his hand into his stomach/difficulty getting past neutral with external rotation 4/5 strength with ER, 4+/5 strength with IR, increased discomfort  Patient is neurovascularly intact all dermatomes extending down his left upper extremity. Radial pulses appreciated, 2+, cap refill less than 3 seconds in each finger.  Imaging: CT scan left shoulder:  1. Severe osteoarthritis of the glenohumeral joint. Erosive changes  along the posterior aspect of the glenohumeral joint which may  reflect underlying inflammatory or crystalline arthropathy.  2. Severe atrophy of the infraspinatus muscle. Mild supraspinatus  muscle atrophy.  3. Severe arthropathy of the acromioclavicular joint.   Plan: The treatment options were discussed with the patient and his wife. In addition, patient educational materials were provided regarding the diagnosis and treatment options. Regarding the patient's left shoulder, he is quite frustrated by his symptoms and functional limitations, and is ready to consider more aggressive treatment options. Therefore, I have recommended a surgical procedure, specifically a reverse left total shoulder arthroplasty. The procedure was discussed with the patient, as were the potential risks (including bleeding, infection, nerve and/or blood vessel injury, persistent or recurrent pain,  loosening and/or failure of  the components, dislocation, need for further surgery, blood clots, strokes, heart attacks and/or arhythmias, pneumonia, etc.) and benefits. The patient states his understanding and wishes to proceed. A CT scan of the left shoulder will be ordered to help with preoperative planning. All of the patient's questions and concerns were answered. He can call any time with further concerns. He will follow up post-surgery, routine.     H&P reviewed and patient re-examined. No changes.

## 2023-03-22 NOTE — Discharge Instructions (Signed)
Diet: As you were doing prior to hospitalization  ? ?Shower:  May shower but keep the wounds dry, use an occlusive plastic wrap, NO SOAKING IN TUB.  If the bandage gets wet, change with a clean dry gauze. ? ?Dressing:  You may change your dressing as needed. Change the dressing with sterile gauze dressing.   ? ?Activity:  Increase activity slowly as tolerated, but follow the weight bearing instructions below.  No lifting or driving for 6 weeks. ? ?Weight Bearing:   Non-weightbearing to the right arm. ? ?To prevent constipation: you may use a stool softener such as - ? ?Colace (over the counter) 100 mg by mouth twice a day  ?Drink plenty of fluids (prune juice may be helpful) and high fiber foods ?Miralax (over the counter) for constipation as needed.   ? ?Itching:  If you experience itching with your medications, try taking only a single pain pill, or even half a pain pill at a time.  You may take up to 10 pain pills per day, and you can also use benadryl over the counter for itching or also to help with sleep.  ? ?Precautions:  If you experience chest pain or shortness of breath - call 911 immediately for transfer to the hospital emergency department!! ? ?If you develop a fever greater that 101 F, purulent drainage from wound, increased redness or drainage from wound, or calf pain-Call [facility]                                              ?Follow- Up Appointment:  Please call for an appointment to be seen in 2 weeks at [facility]  ?

## 2023-03-22 NOTE — Transfer of Care (Signed)
 Immediate Anesthesia Transfer of Care Note  Patient: Jared Baldwin  Procedure(s) Performed: ARTHROPLASTY, SHOULDER, TOTAL, REVERSE (Left: Shoulder)  Patient Location: PACU  Anesthesia Type:General  Level of Consciousness: awake, alert , and oriented  Airway & Oxygen Therapy: Patient Spontanous Breathing and Patient connected to face mask oxygen  Post-op Assessment: Report given to RN and Post -op Vital signs reviewed and stable  Post vital signs: stable  Last Vitals:  Vitals Value Taken Time  BP    Temp    Pulse 67 03/22/23 1348  Resp 14 03/22/23 1348  SpO2 99 % 03/22/23 1348  Vitals shown include unfiled device data.  Last Pain:  Vitals:   03/22/23 0855  TempSrc: Oral  PainSc: 4       Patients Stated Pain Goal: 2 (03/22/23 0855)  Complications: No notable events documented.

## 2023-03-22 NOTE — Op Note (Signed)
 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  Post-Op Diagnosis:   Same  Procedure:   Reverse left total shoulder arthroplasty with biceps tenodesis.  Surgeon:   Maryagnes Amos, MD  Assistant:   Horris Latino, PA-C  Anesthesia:   General endotracheal with an interscalene block using Exparel placed preoperatively by the anesthesiologist.  Findings:   As above.  Complications:   None  EBL:   100 cc  Fluids:   800 cc crystalloid  UOP:   None  TT:   None  Drains:   None  Closure:   Staples  Implants:   All press-fit [employer] Comprehensive system with a #13 identity micro-humeral stem, an extra extended neutral +0 Identity humeral tray with a +3 mm insert, and a large augmented mini-base plate with a 40 mm +3 mm lateralized glenosphere.  Brief Clinical Note:   The patient is a 78 year old male with a long history of progressively worsening pain and stiffness of the left shoulder.  The patient's symptoms have progressed despite medications, activity modification, etc. The patient's history and examination are consistent with severe degenerative joint disease with rotator cuff tendinopathy. The patient presents at this time for a reverse left total shoulder arthroplasty with biceps tenodesis.  Procedure:   The patient underwent placement of an interscalene block using Exparel by the anesthesiologist in the preoperative holding area before being brought into the operating room and lain in the supine position. The patient then underwent general endotracheal intubation and anesthesia before the patient was repositioned in the beach chair position using the beach chair positioner. The left shoulder and upper extremity were prepped with ChloraPrep solution before being draped sterilely. Preoperative antibiotics were administered. A timeout was performed to verify the appropriate  surgical site.    A standard anterior approach to the shoulder was made through an approximately 4-5 inch incision. The incision was carried down through the subcutaneous tissues to expose the deltopectoral fascia. The interval between the deltoid and pectoralis muscles was identified and this plane developed, retracting the cephalic vein laterally with the deltoid muscle. The conjoined tendon was identified. Its lateral margin was dissected and the Kolbel self-retraining retractor inserted. The "three sisters" were identified and cauterized. Bursal tissues were removed to improve visualization.   The biceps tendon was identified near the inferior aspect of the bicipital groove. A soft tissue tenodesis was performed by attaching the biceps tendon to the adjacent pectoralis major tendon using two #0 Ethibond interrupted sutures. The biceps tendon was then transected just proximal to the tenodesis site. The subscapularis tendon was released from its attachment to the lesser tuberosity 1 cm proximal to its insertion and several tagging sutures placed. The inferior capsule was released with care after identifying and protecting the axillary nerve. The proximal humeral cut was made at approximately 25 of retroversion using the extra-medullary guide.   Attention was redirected to the glenoid. The labrum was debrided circumferentially before the Signature guide for the glenoid was positioned appropriately. The two guidewires were drilled into the glenoid vault before the center post reamer was drilled, then overreamed with the mini-baseplate reamer to create a flat surface the large augmented guide was positioned and the pilot hole drilled into approximately 9 o'clock position to maintain appropriate orientation of the large augmented reamer.  An attempt was made to insert the secondary reamer but the shoulder was too tight to permit adequate  positioning of the reamer.  Therefore, the posterior portion of the  glenoid was repaired using a small bur.  The trial large augment was positioned and found to be excellent.  Therefore, the large augmented permanent mini-baseplate was impacted into place. It was stabilized with a 25 x 6.5 mm central screw and four peripheral locking screws. The permanent 40 mm +3 mm lateralized glenosphere was then impacted into place and its Morse taper locking mechanism verified using manual distraction.  Attention was directed to the humeral side. The humeral canal was reamed sequentially beginning with the end-cutting reamer then progressing from a 4 mm reamer up to a 13 mm reamer. This provided excellent circumferential chatter. The canal was broached beginning with a #11 broach and progressing to a #13 broach.  The plastic stem was inserted into the end of the broach and the proximal reaming performed. A trial reduction was performed using first the -6 mm extended neutral platform and then the +0 mm extra extended neutral platform humeral platform with first the +0 mm insert and then the +3 mm insert. With the +3 mm insert, the arm demonstrated excellent range of motion as the hand could be brought across the chest to the opposite shoulder and brought to the top of the patient's head and to the patient's ear. The shoulder appeared stable throughout this range of motion. The joint was dislocated and the trial components removed.   The permanent #13 Identity micro-stem was connected with the +0 mm extra extended neutral humeral platform on the back table before this construct was impacted into place with care taken to maintain the appropriate version. The +3 mm insert was snapped into place. The shoulder was relocated using two finger pressure and again placed through a range of motion with the findings as described above.  The wound was copiously irrigated with sterile saline solution using the jet lavage system before a total of 30 cc of 0.5% Sensorcaine with epinephrine was injected  into the pericapsular and peri-incisional tissues to help with postoperative analgesia. The subscapularis tendon was reapproximated using #2 FiberWire interrupted sutures. The deltopectoral interval was closed using #0 Vicryl interrupted sutures before the subcutaneous tissues were closed using 2-0 Vicryl interrupted sutures. The skin was closed using staples. A sterile occlusive dressing was applied to the wound before the arm was placed into a shoulder immobilizer with an abduction pillow. A Polar Care system also was applied to the shoulder. The patient was then transferred back to a hospital bed before being awakened, extubated, and returned to the recovery room in satisfactory condition after tolerating the procedure well.

## 2023-03-23 ENCOUNTER — Encounter: Payer: Self-pay | Admitting: Surgery

## 2023-03-23 DIAGNOSIS — M19012 Primary osteoarthritis, left shoulder: Secondary | ICD-10-CM | POA: Diagnosis not present

## 2023-03-23 DIAGNOSIS — Z96659 Presence of unspecified artificial knee joint: Secondary | ICD-10-CM | POA: Diagnosis not present

## 2023-03-23 LAB — CBC
HCT: 35.2 % — ABNORMAL LOW (ref 39.0–52.0)
Hemoglobin: 12 g/dL — ABNORMAL LOW (ref 13.0–17.0)
MCH: 31.1 pg (ref 26.0–34.0)
MCHC: 34.1 g/dL (ref 30.0–36.0)
MCV: 91.2 fL (ref 80.0–100.0)
Platelets: 178 10*3/uL (ref 150–400)
RBC: 3.86 MIL/uL — ABNORMAL LOW (ref 4.22–5.81)
RDW: 13.9 % (ref 11.5–15.5)
WBC: 9.3 10*3/uL (ref 4.0–10.5)
nRBC: 0 % (ref 0.0–0.2)

## 2023-03-23 LAB — BASIC METABOLIC PANEL
Anion gap: 11 (ref 5–15)
BUN: 27 mg/dL — ABNORMAL HIGH (ref 8–23)
CO2: 24 mmol/L (ref 22–32)
Calcium: 8.5 mg/dL — ABNORMAL LOW (ref 8.9–10.3)
Chloride: 102 mmol/L (ref 98–111)
Creatinine, Ser: 1.34 mg/dL — ABNORMAL HIGH (ref 0.61–1.24)
GFR, Estimated: 55 mL/min — ABNORMAL LOW (ref >60)
Glucose, Bld: 112 mg/dL — ABNORMAL HIGH (ref 70–99)
Potassium: 4.1 mmol/L (ref 3.5–5.1)
Sodium: 137 mmol/L (ref 135–145)

## 2023-03-23 MED ORDER — TRAMADOL HCL 50 MG PO TABS: 50.0000 mg | ORAL_TABLET | Freq: Four times a day (QID) | ORAL | 0 refills | Status: AC | PRN

## 2023-03-23 MED ORDER — ASPIRIN 325 MG PO TBEC: 325.0000 mg | DELAYED_RELEASE_TABLET | Freq: Every day | ORAL | Status: AC

## 2023-03-23 NOTE — Progress Notes (Signed)
 Subjective: 1 Day Post-Op Procedure(s) (LRB): ARTHROPLASTY, SHOULDER, TOTAL, REVERSE (Left) Patient reports pain as mild.   Patient is well, and has had no acute complaints or problems Plan is to go Home after hospital stay. Negative for chest pain and shortness of breath Fever: no Gastrointestinal: Negative for nausea and vomiting  Objective: Vital signs in last 24 hours: Temp:  [96.8 F (36 C)-98.1 F (36.7 C)] 97.6 F (36.4 C) (03/21 0446) Pulse Rate:  [59-73] 67 (03/21 0446) Resp:  [12-18] 18 (03/21 0446) BP: (115-151)/(46-63) 122/59 (03/21 0446) SpO2:  [97 %-100 %] 99 % (03/21 0446) Weight:  [95.7 kg] 95.7 kg (03/20 0855)  Intake/Output from previous day:  Intake/Output Summary (Last 24 hours) at 03/23/2023 4132 Last data filed at 03/22/2023 2320 Gross per 24 hour  Intake 1100 ml  Output 675 ml  Net 425 ml    Intake/Output this shift: Total I/O In: -  Out: 225 [Urine:225]  Labs: Recent Labs    03/23/23 0236  HGB 12.0*   Recent Labs    03/23/23 0236  WBC 9.3  RBC 3.86*  HCT 35.2*  PLT 178   Recent Labs    03/23/23 0236  NA 137  K 4.1  CL 102  CO2 24  BUN 27*  CREATININE 1.34*  GLUCOSE 112*  CALCIUM 8.5*   No results for input(s): "LABPT", "INR" in the last 72 hours.   EXAM General - Patient is Alert and Oriented Extremity - Neurovascular intact Sensation intact distally Compartment soft Shoulder immobilizer and Polar Care in place Dressing/Incision - clean, dry, no drainage Motor Function - intact, moving fingers and wrist well on exam.   Past Medical History:  Diagnosis Date   Aortic atherosclerosis (HCC)    Aortic stenosis    a. 02/2012 Echo: EF 60-65%, very mild AS; b. 11/2020 Echo: EF 60-65%, GrI DD, no rwma, Nl RV size/fxn, RVSP 23.48mmHg, Mild AS/AI (AoV mean grad ; Vmax 2.57m/s).   Arthritis    B12 deficiency    CAD (coronary artery disease)    11/2020 Cath: LM 30ost/m, LAD 60p (nl iFR @ 0.93), 26m, 50d, LCX 69m, OM1/2  nl, OM3 min irregs, RCA 65p, 30d, RPDA 50. Mild AS w/ peak grad of 12-59mmHg. RHC w/ mild PAH. **Avoid future cath via R Radial due to signif prox R RA stenosis**   Chronic back pain    Depression    Diastolic dysfunction    Erectile dysfunction    a.) on PDE5i (tadalafil)   Gallstone pancreatitis    GERD (gastroesophageal reflux disease)    Hyperlipidemia    Hypertension    Hypothyroidism    Idiopathic chronic pancreatitis (HCC)    Long-term use of aspirin therapy    Obesity    a. s/p gastric sleeve.   Pseudophakia, both eyes    Stage 3a chronic kidney disease (CKD) (HCC)    Unstable angina (HCC)     Assessment/Plan: 1 Day Post-Op Procedure(s) (LRB): ARTHROPLASTY, SHOULDER, TOTAL, REVERSE (Left) Principal Problem:   Status post reverse arthroplasty of shoulder, left  Estimated body mass index is 33.04 kg/m as calculated from the following:   Height as of this encounter: 5\' 7"  (1.702 m).   Weight as of this encounter: 95.7 kg. Advance diet Up with therapy D/C IV fluids  Discharge planning: Plan to discharge home.  No showering.  Dressing changes needed.  Follow-up at [facility] orthopedics in 2 weeks for staple removal  DVT Prophylaxis - Lovenox Left upper extremity and shoulder  immobilizer at all times except for bathing and physical therapy  Dedra Skeens, PA-C Orthopaedic Surgery 03/23/2023, 6:21 AM

## 2023-03-23 NOTE — Evaluation (Signed)
 Occupational Therapy Evaluation Patient Details Name: Jared Baldwin MRN: [id number] DOB: 02-Jun-1945 Today's Date: 03/23/2023   History of Present Illness   Pt is a 78 y.o. 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  PMH includes htn, pulmonary htn, CAD, chronic back pain, s/p gastric sleeve, pseudophakia B eyes, CKD, back sx, B TKA.     Clinical Impressions Upon entering the room,pt supine in bed with daughter and wife present for family training. Pt is Ind at baseline. OT educated pt and family on NWB precautions, sling, polar care,ADLs, and H/W/E exercises. Pt's wife returning demonstrations and handouts provided as well. No further questions at this time. All education completed at this time.      If plan is discharge home, recommend the following:   A little help with walking and/or transfers;A little help with bathing/dressing/bathroom;Assistance with cooking/housework;Assist for transportation;Help with stairs or ramp for entrance     Functional Status Assessment   Patient has had a recent decline in their functional status and demonstrates the ability to make significant improvements in function in a reasonable and predictable amount of time.     Equipment Recommendations   None recommended by OT      Precautions/Restrictions   Precautions Precautions: Fall;Shoulder Shoulder Interventions: Roe Coombs joy ultra sling;Shoulder abduction pillow;At all times;Off for dressing/bathing/exercises Recall of Precautions/Restrictions: Impaired Restrictions Weight Bearing Restrictions Per Provider Order: Yes LUE Weight Bearing Per Provider Order: Non weight bearing Other Position/Activity Restrictions: No weightbearing through left shoulder or upper extremity.  No lifting, pushing, pulling, or carrying more than 5 pounds with the left arm/hand.      Mobility Bed Mobility Overal bed mobility: Needs Assistance Bed Mobility: Supine to Sit     Supine to sit: Min assist          Transfers Overall transfer level: Needs assistance Equipment used: None, 1 person hand held assist Transfers: Sit to/from Stand Sit to Stand: Contact guard assist                  Balance Overall balance assessment: Needs assistance Sitting-balance support: No upper extremity supported, Feet supported Sitting balance-Leahy Scale: Good                                     ADL either performed or assessed with clinical judgement   ADL Overall ADL's : Needs assistance/impaired                 Upper Body Dressing : Moderate assistance   Lower Body Dressing: Moderate assistance                       Vision Patient Visual Report: No change from baseline              Pertinent Vitals/Pain Pain Assessment Pain Assessment: Faces Faces Pain Scale: Hurts a little bit Pain Location: L shoulder Pain Descriptors / Indicators: Discomfort Pain Intervention(s): Limited activity within patient's tolerance, Monitored during session, Repositioned     Extremity/Trunk Assessment Upper Extremity Assessment LUE Deficits / Details: L UE in sling/immobilizer           Communication Communication Communication: No apparent difficulties   Cognition Arousal: Alert Behavior During Therapy: WFL for tasks assessed/performed  Following commands: Intact Following commands impaired: Follows multi-step commands with increased time     Cueing  General Comments   Cueing Techniques: Verbal cues;Visual cues  L UE in sling/immobilizer           Home Living Family/patient expects to be discharged to:: Private residence Living Arrangements: Spouse/significant other Available Help at Discharge: Family Type of Home: House Home Access: Stairs to enter Water quality scientist of Steps: 2 Entrance Stairs-Rails: Right;Left Home Layout: One level     Bathroom Shower/Tub: Producer, television/film/video: Handicapped height                Prior Functioning/Environment Prior Level of Function : Independent/Modified Independent               ADLs Comments: Ind for self care tasks            OT Goals(Current goals can be found in the care plan section)   Acute Rehab OT Goals Patient Stated Goal: to go home and decrease pain OT Goal Formulation: With patient/family Time For Goal Achievement: 04/06/23 Potential to Achieve Goals: Fair   OT Frequency:          AM-PAC OT "6 Clicks" Daily Activity     Outcome Measure Help from another person eating meals?: None Help from another person taking care of personal grooming?: None Help from another person toileting, which includes using toliet, bedpan, or urinal?: A Little Help from another person bathing (including washing, rinsing, drying)?: A Little Help from another person to put on and taking off regular upper body clothing?: A Little Help from another person to put on and taking off regular lower body clothing?: A Little 6 Click Score: 20   End of Session Nurse Communication: Mobility status  Activity Tolerance: Patient tolerated treatment well Patient left: in bed;with call bell/phone within reach;with family/visitor present                   Time: 8119-1478 OT Time Calculation (min): 32 min Charges:  OT General Charges $OT Visit: 1 Visit OT Evaluation $OT Eval Low Complexity: 1 Low OT Treatments $Self Care/Home Management : 23-37 mins  Jackquline Denmark, MS, OTR/L , CBIS ascom [phone]  03/23/23, 12:48 PM

## 2023-03-23 NOTE — Plan of Care (Signed)

## 2023-03-23 NOTE — Progress Notes (Signed)
 Physical Therapy Treatment Patient Details Name: Jared Baldwin MRN: [id number] DOB: 02-Feb-1945 Today's Date: 03/23/2023   History of Present Illness Pt is a 78 y.o. 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  PMH includes htn, pulmonary htn, CAD, chronic back pain, s/p gastric sleeve, pseudophakia B eyes, CKD, back sx, B TKA.    PT Comments  Pt resting in bed upon PT arrival; pt's wife and daughter present; pt agreeable to therapy.  Mild L shoulder pain reported during session.  Pt ambulated 140 feet with SPC CGA to min assist to steady.  Pt reports h/o balance issues (but more so in the morning); pt reports feeling unsteady using cane; pt's wife also voicing concerns regarding pt's balance.  D/t balance concerns, trialed different DME (hemi-walker, SBQC, and single axillary crutch); pt appearing steady and safe ambulating with single axillary crutch (pt reporting feeling steady ambulating with axillary crutch; pt's wife reporting feeling good about pt's walking with use of axillary crutch).  SPC had already been delivered and gone through insurance.  Therapy issued pt rehab therapy stock of brand new axillary crutches (still in package); B crutches adjusted for appropriate fit (although pt only using single axillary crutch via R UE d/t NWB'ing L UE).  Pt then walked in hallway with issued single axillary crutch with appropriate technique and appearing steady and safe.  Pt decided to keep SPC to use once his balance improves.  TOC and PA updated on pt's status and DME needs.  Recommended to pt to have assist with all functional mobility initially for safety (pt's wife reports she will be able to provide assist and demonstrated appropriate ability to cue pt during session as needed).  Pt and pt's wife report no questions/concerns for home discharge today.   If plan is  discharge home, recommend the following: A little help with walking and/or transfers;A little help with bathing/dressing/bathroom;Assistance with cooking/housework;Assist for transportation;Help with stairs or ramp for entrance   Can travel by private vehicle        Equipment Recommendations  Crutches (TOC notified)    Recommendations for Other Services       Precautions / Restrictions Precautions Precautions: Fall;Shoulder Shoulder Interventions: Roe Coombs joy ultra sling;Shoulder abduction pillow;At all times;Off for dressing/bathing/exercises Recall of Precautions/Restrictions: Impaired Restrictions Weight Bearing Restrictions Per Provider Order: Yes LUE Weight Bearing Per Provider Order: Non weight bearing Other Position/Activity Restrictions: No weightbearing through left shoulder or upper extremity.  No lifting, pushing, pulling, or carrying more than 5 pounds with the left arm/hand.     Mobility  Bed Mobility Overal bed mobility: Needs Assistance Bed Mobility: Supine to Sit, Sit to Supine     Supine to sit: Min assist, HOB elevated (x2 trials from bed) Sit to supine: Supervision, HOB elevated (x2 trials from bed)   General bed mobility comments: assist for trunk to sit up; vc's for technique; pt planning to sleep in recliner at home    Transfers Overall transfer level: Needs assistance Equipment used: Straight cane (single axillary crutch) Transfers: Sit to/from Stand Sit to Stand: Contact guard assist           General transfer comment: mild increased effort to stand; vc's for R UE positioning (to push off of bed); x5 trials from bed (pt initially with weight through heels pushing B LE's against bed to stabilize 1st 2 trials but improved balance/technique noted with cues for  technique and to shift weight forward next 3 trials for sit to stand)    Ambulation/Gait Ambulation/Gait assistance: Min assist, Contact guard assist Gait Distance (Feet):  (140 feet with SPC;  40 feet with hemi-walker; 45 feet with single axillary crutch; 45 feet with small based quad cane; 90 feet with single axillary crutch) Assistive device: Straight cane, Hemi-walker, Crutches, Quad cane Gait Pattern/deviations: Step-through pattern, Decreased step length - right, Decreased step length - left Gait velocity: decreased     General Gait Details: pt unsteady with increased B lateral sway ambulating with SPC (CGA to min assist to steady); pt had difficulty managing hemi-walker (CGA for safety with cues for technique); pt unsteady ambulating with quad cane (CGA for safety); pt appearing steady ambulating with single axillary crutch   Stairs Stairs: Yes Stairs assistance: Contact guard assist Stair Management: One rail Right, One rail Left, Step to pattern, Forwards Number of Stairs: 4 General stair comments: steady stairs navigation with R UE support on railing   Wheelchair Mobility     Tilt Bed    Modified Rankin (Stroke Patients Only)       Balance Overall balance assessment: Needs assistance Sitting-balance support: No upper extremity supported, Feet supported Sitting balance-Leahy Scale: Good Sitting balance - Comments: steady reaching within BOS with R UE   Standing balance support: Single extremity supported, During functional activity, Reliant on assistive device for balance Standing balance-Leahy Scale: Good Standing balance comment: steady ambulating with single axillary crutch                            Communication Communication Communication: No apparent difficulties  Cognition Arousal: Alert Behavior During Therapy: WFL for tasks assessed/performed   PT - Cognitive impairments: No apparent impairments                         Following commands: Intact Following commands impaired: Follows multi-step commands with increased time    Cueing Cueing Techniques: Verbal cues, Visual cues  Exercises      General Comments  General comments (skin integrity, edema, etc.): L UE in sling/immobilizer.      Pertinent Vitals/Pain Pain Assessment Pain Assessment: Faces Faces Pain Scale: Hurts a little bit Pain Location: L shoulder Pain Descriptors / Indicators: Discomfort Pain Intervention(s): Limited activity within patient's tolerance, Monitored during session, Repositioned Vitals (HR and SpO2 on room air) stable throughout treatment session.    Home Living                          Prior Function            PT Goals (current goals can now be found in the care plan section) Acute Rehab PT Goals Patient Stated Goal: to improve walking PT Goal Formulation: With patient Time For Goal Achievement: 04/05/23 Potential to Achieve Goals: Good Progress towards PT goals: Progressing toward goals    Frequency    BID      PT Plan      Co-evaluation              AM-PAC PT "6 Clicks" Mobility   Outcome Measure  Help needed turning from your back to your side while in a flat bed without using bedrails?: A Little Help needed moving from lying on your back to sitting on the side of a flat bed without using bedrails?: A Little Help needed moving to  and from a bed to a chair (including a wheelchair)?: A Little Help needed standing up from a chair using your arms (e.g., wheelchair or bedside chair)?: A Little Help needed to walk in hospital room?: A Little Help needed climbing 3-5 steps with a railing? : A Little 6 Click Score: 18    End of Session Equipment Utilized During Treatment: Gait belt (L UE sling/immobilizer) Activity Tolerance: Patient tolerated treatment well Patient left: in bed;with call bell/phone within reach;with bed alarm set;with family/visitor present Nurse Communication: Mobility status;Precautions PT Visit Diagnosis: Unsteadiness on feet (R26.81);Other abnormalities of gait and mobility (R26.89);Muscle weakness (generalized) (M62.81);History of falling  (Z91.81);Pain Pain - Right/Left: Left Pain - part of body: Shoulder     Time: 1348 (15 minutes non-billable time for care team communication and obtaining DME to trial)-1500 PT Time Calculation (min) (ACUTE ONLY): 72 min  Charges:    $Gait Training: 38-52 mins $Therapeutic Activity: 8-22 mins PT General Charges $$ ACUTE PT VISIT: 1 Visit                     Hendricks Limes, PT 03/23/23, 5:29 PM

## 2023-03-23 NOTE — TOC Initial Note (Signed)
 Transition of Care [facility]) - Initial/Assessment Note    Patient Details  Name: Jared Baldwin MRN: [id number] Date of Birth: 09-Apr-1945  Transition of Care [facility]) CM/SW Contact:    Marlowe Sax, RN Phone Number: 03/23/2023, 9:47 AM  Clinical Narrative:                 The patient is set up with [facility] prior to surgery for [facility] by the surgeons office, he will need a SPC and [employer] to deliver to the bedside  Expected Discharge Plan: Home w Home Health Services Barriers to Discharge: Barriers Resolved   Patient Goals and CMS Choice            Expected Discharge Plan and Services   Discharge Planning Services: CM Consult   Living arrangements for the past 2 months: Single Family Home Expected Discharge Date: 03/23/23               DME Arranged: Gilmer Mor DME Agency: [employer] Date DME Agency Contacted: 03/23/23 Time DME Agency Contacted: [phone] Representative spoke with at DME Agency: Osvaldo Angst Arranged: PT, OT HH Agency: [facility] Date [facility] Agency Contacted: 03/23/23 Time HH Agency Contacted: [phone] Representative spoke with at [facility] Agency: [place]  Prior Living Arrangements/Services Living arrangements for the past 2 months: Single Family Home Lives with:: Spouse Patient language and need for interpreter reviewed:: Yes Do you feel safe going back to the place where you live?: Yes      Need for Family Participation in Patient Care: Yes (Comment) Care giver support system in place?: Yes (comment)   Criminal Activity/Legal Involvement Pertinent to Current Situation/Hospitalization: No - Comment as needed  Activities of Daily Living      Permission Sought/Granted   Permission granted to share information with : Yes, Verbal Permission Granted              Emotional Assessment Appearance:: Appears stated age Attitude/Demeanor/Rapport: Engaged Affect (typically observed): Pleasant Orientation: : Oriented to Situation, Oriented to  Time, Oriented to Place,  Oriented to Self Alcohol / Substance Use: Not Applicable Psych Involvement: No (comment)  Admission diagnosis:  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] Status post reverse arthroplasty of shoulder, left [Z96.612] Patient Active Problem List   Diagnosis Date Noted   Status post reverse arthroplasty of shoulder, left 03/22/2023   Stage 3a chronic kidney disease (HCC) 03/16/2021   Vitreous membranes and strands, left eye 11/22/2020   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/22/2020   Posterior vitreous detachment, left eye 11/22/2020   Pseudophakia, both eyes 11/22/2020   History of retinal detachment 11/22/2020   Coronary artery disease involving native coronary artery of native heart without angina pectoris 11/15/2020   Aortic valve stenosis    Unstable angina (HCC)    Osteoarthritis of left shoulder 02/21/2017   Gallstone pancreatitis 09/07/2016   Aortic atherosclerosis (HCC) 09/06/2016   Idiopathic chronic pancreatitis (HCC) 09/06/2016   S/P total knee arthroplasty 07/24/2016   B12 deficiency 04/23/2016   S/P bariatric surgery 04/20/2016   Chest pain 02/19/2012   SOB (shortness of breath) 02/19/2012   Claudication of lower extremity (HCC) 02/19/2012   Chronic back pain 02/19/2012   Hyperlipidemia    Hypertension    Recurrent major depression in remission (HCC) 08/28/2011   Morbid obesity (HCC) 10/11/2010   PCP:  Lynnea Ferrier, MD Pharmacy:   [facility] [id number] [place], [place] - [address] [place] [address] Phone: [phone]  Fax: [phone]  [facility][id number] [place], [place] - [address] AT [facility] [address] [place] [address] Phone: [phone] Fax: [phone]     Social Drivers of Health (SDOH) Social History: SDOH Screenings   Food Insecurity: No Food Insecurity (01/31/2023)   Received from [facility]  Housing:  Unknown (01/31/2023)   Received from [facility]  Transportation Needs: No Transportation Needs (01/31/2023)   Received from [facility]  Utilities: Not At Risk (01/31/2023)   Received from [facility]  Financial Resource Strain: Low Risk  (01/31/2023)   Received from [facility]  Tobacco Use: Medium Risk (03/22/2023)   SDOH Interventions:     Readmission Risk Interventions     No data to display

## 2023-03-23 NOTE — Plan of Care (Signed)

## 2023-03-23 NOTE — Progress Notes (Signed)
 Physical Therapy Treatment Patient Details Name: Jared Baldwin MRN: [id number] DOB: [birth year] Today's Date: 03/23/2023   History of Present Illness Pt is a 78 y.o. 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  PMH includes htn, pulmonary htn, CAD, chronic back pain, s/p gastric sleeve, pseudophakia B eyes, CKD, back sx, B TKA.    PT Comments  Pt resting in bed upon PT arrival; pt agreeable to therapy; mild L shoulder discomfort reported.  During session pt was mod assist semi-supine to sitting EOB; CGA for transfers; CGA to min assist ambulating without UE support; and CGA ambulating with SPC.  Pt appearing unsteady during standing activities requiring assist for balance at times but balance improved with use of SPC.  Will continue to focus on balance and progressive functional mobility during hospitalization.  Nurse notified of pt's status and plan for another PT session this afternoon.  TOC updated on recommendations.   If plan is discharge home, recommend the following: A little help with walking and/or transfers;A little help with bathing/dressing/bathroom;Assistance with cooking/housework;Assist for transportation;Help with stairs or ramp for entrance   Can travel by private vehicle        Equipment Recommendations  Cane    Recommendations for Other Services       Precautions / Restrictions Precautions Precautions: Fall;Shoulder Shoulder Interventions: Roe Coombs joy ultra sling;Shoulder abduction pillow;At all times Restrictions Weight Bearing Restrictions Per Provider Order: Yes LUE Weight Bearing Per Provider Order: Non weight bearing Other Position/Activity Restrictions: No weightbearing through left shoulder or upper extremity.  No lifting, pushing, pulling, or carrying more than 5 pounds with the left arm/hand.     Mobility  Bed Mobility Overal  bed mobility: Needs Assistance Bed Mobility: Supine to Sit     Supine to sit: Mod assist, HOB elevated     General bed mobility comments: assist for trunk; vc's for technique    Transfers Overall transfer level: Needs assistance Equipment used: None Transfers: Sit to/from Stand Sit to Stand: Contact guard assist           General transfer comment: increased effort to stand; vc's for R UE positioning; x1 trial from bed and x1 trial from toilet (using grab bar)    Ambulation/Gait Ambulation/Gait assistance: Min assist, Contact guard assist Gait Distance (Feet): 140 Feet Assistive device: None, Straight cane Gait Pattern/deviations: Step-through pattern, Decreased step length - right, Decreased step length - left Gait velocity: decreased     General Gait Details: increased B lateral sway; pt unsteady at times requiring min assist to steady ambulating without UE support (improved balance noted ambulating with SPC)   Stairs Stairs: Yes Stairs assistance: Contact guard assist Stair Management: One rail Right, One rail Left, Step to pattern, Forwards Number of Stairs: 4 General stair comments: steady stairs navigation with R UE support on railing   Wheelchair Mobility     Tilt Bed    Modified Rankin (Stroke Patients Only)       Balance Overall balance assessment: Needs assistance Sitting-balance support: No upper extremity supported, Feet supported Sitting balance-Leahy Scale: Good Sitting balance - Comments: steady reaching within BOS with R UE   Standing balance support: No upper extremity supported, During functional activity Standing balance-Leahy Scale: Poor Standing balance comment: intermittent assist to steady with ambulation (no AD use)  Communication Communication Communication: No apparent difficulties  Cognition Arousal: Alert Behavior During Therapy: WFL for tasks assessed/performed   PT - Cognitive  impairments: No apparent impairments                         Following commands: Intact Following commands impaired: Follows multi-step commands with increased time    Cueing Cueing Techniques: Verbal cues, Visual cues  Exercises      General Comments General comments (skin integrity, edema, etc.): L UE in sling/immobilizer      Pertinent Vitals/Pain Pain Assessment Pain Assessment: Faces Faces Pain Scale: Hurts a little bit Pain Location: L shoulder Pain Descriptors / Indicators: Discomfort Pain Intervention(s): Limited activity within patient's tolerance, Monitored during session, Repositioned, Other (comment) (polar care applied) Vitals (HR and SpO2 on room air) stable throughout treatment session.    Home Living                          Prior Function            PT Goals (current goals can now be found in the care plan section) Acute Rehab PT Goals Patient Stated Goal: to improve walking PT Goal Formulation: With patient Time For Goal Achievement: 04/05/23 Potential to Achieve Goals: Good Progress towards PT goals: Progressing toward goals    Frequency    BID      PT Plan      Co-evaluation              AM-PAC PT "6 Clicks" Mobility   Outcome Measure  Help needed turning from your back to your side while in a flat bed without using bedrails?: A Little Help needed moving from lying on your back to sitting on the side of a flat bed without using bedrails?: A Lot Help needed moving to and from a bed to a chair (including a wheelchair)?: A Little Help needed standing up from a chair using your arms (e.g., wheelchair or bedside chair)?: A Little Help needed to walk in hospital room?: A Little Help needed climbing 3-5 steps with a railing? : A Little 6 Click Score: 17    End of Session Equipment Utilized During Treatment: Gait belt (L UE sling/immobilizer) Activity Tolerance: Patient tolerated treatment well Patient left: in  chair;with call bell/phone within reach;with chair alarm set;with SCD's reapplied Nurse Communication: Mobility status;Precautions PT Visit Diagnosis: Unsteadiness on feet (R26.81);Other abnormalities of gait and mobility (R26.89);Muscle weakness (generalized) (M62.81);History of falling (Z91.81);Pain Pain - Right/Left: Left Pain - part of body: Shoulder     Time: 0454-0981 PT Time Calculation (min) (ACUTE ONLY): 23 min  Charges:    $Gait Training: 8-22 mins $Therapeutic Activity: 8-22 mins PT General Charges $$ ACUTE PT VISIT: 1 Visit                     Hendricks Limes, PT 03/23/23, 9:53 AM

## 2023-03-23 NOTE — Anesthesia Postprocedure Evaluation (Signed)
 Anesthesia Post Note  Patient: Jared Baldwin  Procedure(s) Performed: ARTHROPLASTY, SHOULDER, TOTAL, REVERSE (Left: Shoulder)  Patient location during evaluation: PACU Anesthesia Type: General Level of consciousness: awake and alert Pain management: pain level controlled Vital Signs Assessment: post-procedure vital signs reviewed and stable Respiratory status: spontaneous breathing, nonlabored ventilation and respiratory function stable Cardiovascular status: blood pressure returned to baseline and stable Postop Assessment: no apparent nausea or vomiting Anesthetic complications: no   No notable events documented.   Last Vitals:  Vitals:   03/22/23 2054 03/23/23 0446  BP: (!) 115/55 (!) 122/59  Pulse: 73 67  Resp: 17 18  Temp: 36.6 C 36.4 C  SpO2: 97% 99%    Last Pain:  Vitals:   03/23/23 0552  TempSrc:   PainSc: 3                  Foye Deer

## 2023-03-24 DIAGNOSIS — N529 Male erectile dysfunction, unspecified: Secondary | ICD-10-CM | POA: Diagnosis not present

## 2023-03-24 DIAGNOSIS — N1831 Chronic kidney disease, stage 3a: Secondary | ICD-10-CM | POA: Diagnosis not present

## 2023-03-24 DIAGNOSIS — Z87891 Personal history of nicotine dependence: Secondary | ICD-10-CM | POA: Diagnosis not present

## 2023-03-24 DIAGNOSIS — I700 Atherosclerosis of aorta: Secondary | ICD-10-CM | POA: Diagnosis not present

## 2023-03-24 DIAGNOSIS — M545 Low back pain, unspecified: Secondary | ICD-10-CM | POA: Diagnosis not present

## 2023-03-24 DIAGNOSIS — Z471 Aftercare following joint replacement surgery: Secondary | ICD-10-CM | POA: Diagnosis not present

## 2023-03-24 DIAGNOSIS — E559 Vitamin D deficiency, unspecified: Secondary | ICD-10-CM | POA: Diagnosis not present

## 2023-03-24 DIAGNOSIS — Z96653 Presence of artificial knee joint, bilateral: Secondary | ICD-10-CM | POA: Diagnosis not present

## 2023-03-24 DIAGNOSIS — G8929 Other chronic pain: Secondary | ICD-10-CM | POA: Diagnosis not present

## 2023-03-24 DIAGNOSIS — Z6831 Body mass index (BMI) 31.0-31.9, adult: Secondary | ICD-10-CM | POA: Diagnosis not present

## 2023-03-24 DIAGNOSIS — Z7982 Long term (current) use of aspirin: Secondary | ICD-10-CM | POA: Diagnosis not present

## 2023-03-24 DIAGNOSIS — I2510 Atherosclerotic heart disease of native coronary artery without angina pectoris: Secondary | ICD-10-CM | POA: Diagnosis not present

## 2023-03-24 DIAGNOSIS — Z96612 Presence of left artificial shoulder joint: Secondary | ICD-10-CM | POA: Diagnosis not present

## 2023-03-24 DIAGNOSIS — I350 Nonrheumatic aortic (valve) stenosis: Secondary | ICD-10-CM | POA: Diagnosis not present

## 2023-03-24 DIAGNOSIS — Z791 Long term (current) use of non-steroidal anti-inflammatories (NSAID): Secondary | ICD-10-CM | POA: Diagnosis not present

## 2023-03-24 DIAGNOSIS — Z79891 Long term (current) use of opiate analgesic: Secondary | ICD-10-CM | POA: Diagnosis not present

## 2023-03-24 DIAGNOSIS — F3340 Major depressive disorder, recurrent, in remission, unspecified: Secondary | ICD-10-CM | POA: Diagnosis not present

## 2023-03-24 DIAGNOSIS — I129 Hypertensive chronic kidney disease with stage 1 through stage 4 chronic kidney disease, or unspecified chronic kidney disease: Secondary | ICD-10-CM | POA: Diagnosis not present

## 2023-03-24 DIAGNOSIS — E785 Hyperlipidemia, unspecified: Secondary | ICD-10-CM | POA: Diagnosis not present

## 2023-03-24 DIAGNOSIS — K219 Gastro-esophageal reflux disease without esophagitis: Secondary | ICD-10-CM | POA: Diagnosis not present

## 2023-03-24 DIAGNOSIS — Z9181 History of falling: Secondary | ICD-10-CM | POA: Diagnosis not present

## 2023-03-24 DIAGNOSIS — E039 Hypothyroidism, unspecified: Secondary | ICD-10-CM | POA: Diagnosis not present

## 2023-03-24 DIAGNOSIS — K861 Other chronic pancreatitis: Secondary | ICD-10-CM | POA: Diagnosis not present

## 2023-03-26 DIAGNOSIS — Z471 Aftercare following joint replacement surgery: Secondary | ICD-10-CM | POA: Diagnosis not present

## 2023-04-04 DIAGNOSIS — Z471 Aftercare following joint replacement surgery: Secondary | ICD-10-CM | POA: Diagnosis not present

## 2023-04-06 DIAGNOSIS — M25612 Stiffness of left shoulder, not elsewhere classified: Secondary | ICD-10-CM | POA: Diagnosis not present

## 2023-04-06 DIAGNOSIS — Z96612 Presence of left artificial shoulder joint: Secondary | ICD-10-CM | POA: Diagnosis not present

## 2023-04-12 DIAGNOSIS — Z96612 Presence of left artificial shoulder joint: Secondary | ICD-10-CM | POA: Diagnosis not present

## 2023-04-18 DIAGNOSIS — Z471 Aftercare following joint replacement surgery: Secondary | ICD-10-CM | POA: Diagnosis not present

## 2023-04-19 DIAGNOSIS — M25612 Stiffness of left shoulder, not elsewhere classified: Secondary | ICD-10-CM | POA: Diagnosis not present

## 2023-04-19 DIAGNOSIS — Z96612 Presence of left artificial shoulder joint: Secondary | ICD-10-CM | POA: Diagnosis not present

## 2023-04-27 DIAGNOSIS — M25612 Stiffness of left shoulder, not elsewhere classified: Secondary | ICD-10-CM | POA: Diagnosis not present

## 2023-04-27 DIAGNOSIS — Z96612 Presence of left artificial shoulder joint: Secondary | ICD-10-CM | POA: Diagnosis not present

## 2023-04-27 DIAGNOSIS — Z471 Aftercare following joint replacement surgery: Secondary | ICD-10-CM | POA: Diagnosis not present

## 2023-04-30 DIAGNOSIS — Z96612 Presence of left artificial shoulder joint: Secondary | ICD-10-CM | POA: Diagnosis not present

## 2023-04-30 DIAGNOSIS — M25512 Pain in left shoulder: Secondary | ICD-10-CM | POA: Diagnosis not present

## 2023-05-04 DIAGNOSIS — M7581 Other shoulder lesions, right shoulder: Secondary | ICD-10-CM | POA: Diagnosis not present

## 2023-05-04 DIAGNOSIS — Z96612 Presence of left artificial shoulder joint: Secondary | ICD-10-CM | POA: Diagnosis not present

## 2023-05-07 DIAGNOSIS — M25512 Pain in left shoulder: Secondary | ICD-10-CM | POA: Diagnosis not present

## 2023-05-07 DIAGNOSIS — Z96612 Presence of left artificial shoulder joint: Secondary | ICD-10-CM | POA: Diagnosis not present

## 2023-05-09 ENCOUNTER — Ambulatory Visit: Payer: HMO | Admitting: Podiatry

## 2023-05-09 ENCOUNTER — Encounter: Payer: Self-pay | Admitting: Podiatry

## 2023-05-09 DIAGNOSIS — M79675 Pain in left toe(s): Secondary | ICD-10-CM | POA: Diagnosis not present

## 2023-05-09 DIAGNOSIS — B351 Tinea unguium: Secondary | ICD-10-CM

## 2023-05-09 DIAGNOSIS — M79674 Pain in right toe(s): Secondary | ICD-10-CM | POA: Diagnosis not present

## 2023-05-11 NOTE — Progress Notes (Signed)
  Subjective:  Patient ID: Jared Baldwin, male    DOB: 03/29/1945,  MRN: [id number]  Chief Complaint  Patient presents with   Nail Problem    "Cut my nails."    78 y.o. male presents with the above complaint. History confirmed with patient.  Nails are thickened elongated causing discomfort again.  Previous debridements have been quite helpful.  Objective:  Physical Exam: warm, good capillary refill, no trophic changes or ulcerative lesions, normal DP and PT pulses, and normal sensory exam. Left Foot: dystrophic yellowed discolored nail plates with subungual debris Right Foot: dystrophic yellowed discolored nail plates with subungual debris  Assessment:   1. Pain due to onychomycosis of toenails of both feet       Plan:  Patient was evaluated and treated and all questions answered.  Discussed the etiology and treatment options for the condition in detail with the patient.  Regular debridement has been helpful. Recommended debridement of the nails today. Sharp and mechanical debridement performed of all painful and mycotic nails today. Nails debrided in length and thickness using a nail nipper and rotary bur to level of comfort. Discussed treatment options including appropriate shoe gear. Follow up as needed for painful nails.   Return in about 9 weeks (around 07/11/2023) for painful thick fungal nails.

## 2023-05-15 DIAGNOSIS — M25512 Pain in left shoulder: Secondary | ICD-10-CM | POA: Diagnosis not present

## 2023-05-15 DIAGNOSIS — Z96612 Presence of left artificial shoulder joint: Secondary | ICD-10-CM | POA: Diagnosis not present

## 2023-05-16 DIAGNOSIS — H401131 Primary open-angle glaucoma, bilateral, mild stage: Secondary | ICD-10-CM | POA: Diagnosis not present

## 2023-05-17 DIAGNOSIS — Z471 Aftercare following joint replacement surgery: Secondary | ICD-10-CM | POA: Diagnosis not present

## 2023-05-22 DIAGNOSIS — H401131 Primary open-angle glaucoma, bilateral, mild stage: Secondary | ICD-10-CM | POA: Diagnosis not present

## 2023-05-22 DIAGNOSIS — H26492 Other secondary cataract, left eye: Secondary | ICD-10-CM | POA: Diagnosis not present

## 2023-05-22 DIAGNOSIS — H40051 Ocular hypertension, right eye: Secondary | ICD-10-CM | POA: Diagnosis not present

## 2023-05-22 DIAGNOSIS — T8522XA Displacement of intraocular lens, initial encounter: Secondary | ICD-10-CM | POA: Diagnosis not present

## 2023-05-29 ENCOUNTER — Other Ambulatory Visit: Payer: Self-pay | Admitting: Nurse Practitioner

## 2023-05-30 DIAGNOSIS — M25612 Stiffness of left shoulder, not elsewhere classified: Secondary | ICD-10-CM | POA: Diagnosis not present

## 2023-05-30 DIAGNOSIS — Z96612 Presence of left artificial shoulder joint: Secondary | ICD-10-CM | POA: Diagnosis not present

## 2023-06-08 DIAGNOSIS — Z96612 Presence of left artificial shoulder joint: Secondary | ICD-10-CM | POA: Diagnosis not present

## 2023-06-08 DIAGNOSIS — M25512 Pain in left shoulder: Secondary | ICD-10-CM | POA: Diagnosis not present

## 2023-06-15 DIAGNOSIS — M25512 Pain in left shoulder: Secondary | ICD-10-CM | POA: Diagnosis not present

## 2023-06-15 DIAGNOSIS — Z96612 Presence of left artificial shoulder joint: Secondary | ICD-10-CM | POA: Diagnosis not present

## 2023-06-22 DIAGNOSIS — E538 Deficiency of other specified B group vitamins: Secondary | ICD-10-CM | POA: Diagnosis not present

## 2023-06-22 DIAGNOSIS — E785 Hyperlipidemia, unspecified: Secondary | ICD-10-CM | POA: Diagnosis not present

## 2023-06-22 DIAGNOSIS — N1831 Chronic kidney disease, stage 3a: Secondary | ICD-10-CM | POA: Diagnosis not present

## 2023-06-22 DIAGNOSIS — R7303 Prediabetes: Secondary | ICD-10-CM | POA: Diagnosis not present

## 2023-06-27 DIAGNOSIS — N1831 Chronic kidney disease, stage 3a: Secondary | ICD-10-CM | POA: Diagnosis not present

## 2023-06-27 DIAGNOSIS — M879 Osteonecrosis, unspecified: Secondary | ICD-10-CM | POA: Diagnosis not present

## 2023-06-27 DIAGNOSIS — R519 Headache, unspecified: Secondary | ICD-10-CM | POA: Diagnosis not present

## 2023-06-27 DIAGNOSIS — I2510 Atherosclerotic heart disease of native coronary artery without angina pectoris: Secondary | ICD-10-CM | POA: Diagnosis not present

## 2023-06-27 DIAGNOSIS — Z0000 Encounter for general adult medical examination without abnormal findings: Secondary | ICD-10-CM | POA: Diagnosis not present

## 2023-06-27 DIAGNOSIS — I10 Essential (primary) hypertension: Secondary | ICD-10-CM | POA: Diagnosis not present

## 2023-06-27 DIAGNOSIS — E538 Deficiency of other specified B group vitamins: Secondary | ICD-10-CM | POA: Diagnosis not present

## 2023-06-27 DIAGNOSIS — K219 Gastro-esophageal reflux disease without esophagitis: Secondary | ICD-10-CM | POA: Diagnosis not present

## 2023-06-27 DIAGNOSIS — I700 Atherosclerosis of aorta: Secondary | ICD-10-CM | POA: Diagnosis not present

## 2023-06-27 DIAGNOSIS — F3340 Major depressive disorder, recurrent, in remission, unspecified: Secondary | ICD-10-CM | POA: Diagnosis not present

## 2023-06-27 DIAGNOSIS — Z9884 Bariatric surgery status: Secondary | ICD-10-CM | POA: Diagnosis not present

## 2023-06-27 DIAGNOSIS — E039 Hypothyroidism, unspecified: Secondary | ICD-10-CM | POA: Diagnosis not present

## 2023-06-27 DIAGNOSIS — H5711 Ocular pain, right eye: Secondary | ICD-10-CM | POA: Diagnosis not present

## 2023-06-27 DIAGNOSIS — M5090 Cervical disc disorder, unspecified, unspecified cervical region: Secondary | ICD-10-CM | POA: Diagnosis not present

## 2023-06-29 DIAGNOSIS — Z96612 Presence of left artificial shoulder joint: Secondary | ICD-10-CM | POA: Diagnosis not present

## 2023-06-29 DIAGNOSIS — M25512 Pain in left shoulder: Secondary | ICD-10-CM | POA: Diagnosis not present

## 2023-07-11 ENCOUNTER — Ambulatory Visit: Admitting: Podiatry

## 2023-07-11 ENCOUNTER — Encounter: Payer: Self-pay | Admitting: Podiatry

## 2023-07-11 VITALS — Ht 67.0 in | Wt 211.0 lb

## 2023-07-11 DIAGNOSIS — M79675 Pain in left toe(s): Secondary | ICD-10-CM

## 2023-07-11 DIAGNOSIS — B351 Tinea unguium: Secondary | ICD-10-CM

## 2023-07-11 DIAGNOSIS — M79674 Pain in right toe(s): Secondary | ICD-10-CM

## 2023-07-11 NOTE — Progress Notes (Signed)
  Subjective:  Patient ID: Jared Baldwin, male    DOB: May 24, 1945,  MRN: [id number]  Chief Complaint  Patient presents with   Nail Problem    RM 8 Patient is here for routine foot care and nail trimming.    78 y.o. male presents with the above complaint. History confirmed with patient.  Nails are thickened elongated causing discomfort again.  Previous debridements have been quite helpful.  Objective:  Physical Exam: warm, good capillary refill, no trophic changes or ulcerative lesions, normal DP and PT pulses, and normal sensory exam. Left Foot: dystrophic yellowed discolored nail plates with subungual debris Right Foot: dystrophic yellowed discolored nail plates with subungual debris  Assessment:   1. Pain due to onychomycosis of toenails of both feet       Plan:  Patient was evaluated and treated and all questions answered.  Discussed the etiology and treatment options for the condition in detail with the patient.  Regular debridement has been helpful. Recommended debridement of the nails today. Sharp and mechanical debridement performed of all painful and mycotic nails today. Nails debrided in length and thickness using a nail nipper and rotary bur to level of comfort. Discussed treatment options including appropriate shoe gear. Follow up as needed for painful nails.  May consider perm total matrixectomy bilateral hallux at some point   Return in about 9 weeks (around 09/12/2023) for painful thick fungal nails.

## 2023-07-25 DIAGNOSIS — Z96612 Presence of left artificial shoulder joint: Secondary | ICD-10-CM | POA: Diagnosis not present

## 2023-07-25 DIAGNOSIS — M25512 Pain in left shoulder: Secondary | ICD-10-CM | POA: Diagnosis not present

## 2023-07-31 DIAGNOSIS — H5711 Ocular pain, right eye: Secondary | ICD-10-CM | POA: Diagnosis not present

## 2023-08-01 DIAGNOSIS — G8929 Other chronic pain: Secondary | ICD-10-CM | POA: Diagnosis not present

## 2023-08-01 DIAGNOSIS — M7581 Other shoulder lesions, right shoulder: Secondary | ICD-10-CM | POA: Diagnosis not present

## 2023-08-06 ENCOUNTER — Encounter: Payer: Self-pay | Admitting: Urology

## 2023-08-07 DIAGNOSIS — H16223 Keratoconjunctivitis sicca, not specified as Sjogren's, bilateral: Secondary | ICD-10-CM | POA: Diagnosis not present

## 2023-08-08 DIAGNOSIS — M25512 Pain in left shoulder: Secondary | ICD-10-CM | POA: Diagnosis not present

## 2023-08-08 DIAGNOSIS — Z96612 Presence of left artificial shoulder joint: Secondary | ICD-10-CM | POA: Diagnosis not present

## 2023-09-12 ENCOUNTER — Ambulatory Visit: Admitting: Podiatry

## 2023-09-12 ENCOUNTER — Encounter: Payer: Self-pay | Admitting: Podiatry

## 2023-09-12 DIAGNOSIS — M79674 Pain in right toe(s): Secondary | ICD-10-CM

## 2023-09-12 DIAGNOSIS — B351 Tinea unguium: Secondary | ICD-10-CM | POA: Diagnosis not present

## 2023-09-12 DIAGNOSIS — M79675 Pain in left toe(s): Secondary | ICD-10-CM | POA: Diagnosis not present

## 2023-09-12 NOTE — Telephone Encounter (Signed)
 Error

## 2023-09-12 NOTE — Progress Notes (Signed)
  Subjective:  Patient ID: Jared Baldwin, male    DOB: 07/26/1945,  MRN: [id number]  Chief Complaint  Patient presents with   Nail Problem    RFC    78 y.o. male presents with the above complaint. History confirmed with patient.  Nails are thickened elongated causing discomfort again.  Previous debridements have been quite helpful.  Objective:  Physical Exam: warm, good capillary refill, no trophic changes or ulcerative lesions, normal DP and PT pulses, and normal sensory exam. Left Foot: dystrophic yellowed discolored nail plates with subungual debris Right Foot: dystrophic yellowed discolored nail plates with subungual debris  Assessment:   1. Pain due to onychomycosis of toenails of both feet       Plan:  Patient was evaluated and treated and all questions answered.  Discussed the etiology and treatment options for the condition in detail with the patient.  Regular debridement has been helpful. Recommended debridement of the nails today. Sharp and mechanical debridement performed of all painful and mycotic nails today. Nails debrided in length and thickness using a nail nipper and rotary bur to level of comfort. Discussed treatment options including appropriate shoe gear. Follow up as needed for painful nails.  Slight iatrogenic bleeding of right hallux nail dressed with Neosporin  and a Band-Aid advised on signs and symptoms of infection and he will notify me if this happens.    Return in about 9 weeks (around 11/14/2023) for painful thick fungal nails.

## 2023-09-18 DIAGNOSIS — H16223 Keratoconjunctivitis sicca, not specified as Sjogren's, bilateral: Secondary | ICD-10-CM | POA: Diagnosis not present

## 2023-09-18 DIAGNOSIS — H53149 Visual discomfort, unspecified: Secondary | ICD-10-CM | POA: Diagnosis not present

## 2023-10-08 ENCOUNTER — Telehealth: Payer: Self-pay | Admitting: Cardiovascular Disease

## 2023-10-08 NOTE — Telephone Encounter (Signed)
 Pt c/o Shortness Of Breath: STAT if SOB developed within the last 24 hours or pt is noticeably SOB on the phone  1. Are you currently SOB (can you hear that pt is SOB on the phone)?   2. How long have you been experiencing SOB?  A couple weeks   3. Are you SOB when sitting or when up moving around?  When up and around  4. Are you currently experiencing any other symptoms?  Tiredness

## 2023-10-09 NOTE — Telephone Encounter (Signed)
 Called patient to review symptoms of SOB and fatigue - advised that the patient needed to be seen in the office for evaluation - Patient will only see Dr. Darron in the mornings (November 20 at 8:20 is first available) offered patient to see other providers in office as early as tomorrow - patient refused

## 2023-11-02 DIAGNOSIS — M25511 Pain in right shoulder: Secondary | ICD-10-CM | POA: Diagnosis not present

## 2023-11-02 DIAGNOSIS — G8929 Other chronic pain: Secondary | ICD-10-CM | POA: Diagnosis not present

## 2023-11-02 DIAGNOSIS — M7581 Other shoulder lesions, right shoulder: Secondary | ICD-10-CM | POA: Diagnosis not present

## 2023-11-14 ENCOUNTER — Ambulatory Visit: Admitting: Podiatry

## 2023-11-14 VITALS — Ht 67.0 in | Wt 210.9 lb

## 2023-11-14 DIAGNOSIS — M79675 Pain in left toe(s): Secondary | ICD-10-CM

## 2023-11-14 DIAGNOSIS — M79674 Pain in right toe(s): Secondary | ICD-10-CM | POA: Diagnosis not present

## 2023-11-14 DIAGNOSIS — B351 Tinea unguium: Secondary | ICD-10-CM | POA: Diagnosis not present

## 2023-11-14 NOTE — Progress Notes (Signed)
  Subjective:  Patient ID: Jared Baldwin, male    DOB: 05/16/1945,  MRN: [id number]  Chief Complaint  Patient presents with   Nail Problem    RM 4 RFC    78 y.o. male presents with the above complaint. History confirmed with patient.  Nails are thickened elongated causing discomfort again.  Previous debridements have been quite helpful.  Objective:  Physical Exam: warm, good capillary refill, no trophic changes or ulcerative lesions, normal DP and PT pulses, and normal sensory exam. Left Foot: dystrophic yellowed discolored nail plates with subungual debris Right Foot: dystrophic yellowed discolored nail plates with subungual debris  Assessment:   1. Pain due to onychomycosis of toenails of both feet       Plan:  Patient was evaluated and treated and all questions answered.  Discussed the etiology and treatment options for the condition in detail with the patient.  Regular debridement has been helpful. Recommended debridement of the nails today. Sharp and mechanical debridement performed of all painful and mycotic nails today. Nails debrided in length and thickness using a nail nipper and rotary bur to level of comfort. Discussed treatment options including appropriate shoe gear. Follow up as needed for painful nails.     Return in about 9 weeks (around 01/16/2024) for painful thick fungal nails.

## 2023-11-21 DIAGNOSIS — H5711 Ocular pain, right eye: Secondary | ICD-10-CM | POA: Diagnosis not present

## 2023-11-21 DIAGNOSIS — H40051 Ocular hypertension, right eye: Secondary | ICD-10-CM | POA: Diagnosis not present

## 2023-11-22 ENCOUNTER — Encounter: Payer: Self-pay | Admitting: Cardiovascular Disease

## 2023-11-22 ENCOUNTER — Ambulatory Visit: Attending: Cardiovascular Disease | Admitting: Cardiovascular Disease

## 2023-11-22 ENCOUNTER — Other Ambulatory Visit: Payer: Self-pay | Admitting: Ophthalmology

## 2023-11-22 VITALS — BP 114/50 | HR 65 | Ht 67.0 in | Wt 184.5 lb

## 2023-11-22 DIAGNOSIS — I2510 Atherosclerotic heart disease of native coronary artery without angina pectoris: Secondary | ICD-10-CM | POA: Diagnosis not present

## 2023-11-22 DIAGNOSIS — I10 Essential (primary) hypertension: Secondary | ICD-10-CM | POA: Diagnosis not present

## 2023-11-22 DIAGNOSIS — K219 Gastro-esophageal reflux disease without esophagitis: Secondary | ICD-10-CM | POA: Diagnosis not present

## 2023-11-22 DIAGNOSIS — E785 Hyperlipidemia, unspecified: Secondary | ICD-10-CM | POA: Diagnosis not present

## 2023-11-22 DIAGNOSIS — N1831 Chronic kidney disease, stage 3a: Secondary | ICD-10-CM | POA: Diagnosis not present

## 2023-11-22 DIAGNOSIS — R7303 Prediabetes: Secondary | ICD-10-CM | POA: Diagnosis not present

## 2023-11-22 DIAGNOSIS — I350 Nonrheumatic aortic (valve) stenosis: Secondary | ICD-10-CM | POA: Diagnosis not present

## 2023-11-22 DIAGNOSIS — H5711 Ocular pain, right eye: Secondary | ICD-10-CM

## 2023-11-22 DIAGNOSIS — J069 Acute upper respiratory infection, unspecified: Secondary | ICD-10-CM | POA: Diagnosis not present

## 2023-11-22 MED ORDER — PRAVASTATIN SODIUM 40 MG PO TABS: 40.0000 mg | ORAL_TABLET | Freq: Every evening | ORAL | 3 refills | Status: AC

## 2023-11-22 MED ORDER — LOSARTAN POTASSIUM 25 MG PO TABS: 25.0000 mg | ORAL_TABLET | Freq: Every day | ORAL | 1 refills | Status: AC

## 2023-11-22 NOTE — Progress Notes (Signed)
 Cardiology Office Note   Date:  11/22/2023   ID:  Jared Baldwin, Jared Baldwin Jan 07, 1945, MRN [id number]  PCP:  Fernande Ophelia JINNY DOUGLAS, MD  Cardiologist:   Deatrice Cage, MD   Chief Complaint  Patient presents with   Follow-up    Sob, fatigue x 2 weeks/LS 03/2023. Meds reviewed verbally with pt.      History of Present Illness: Jared Baldwin is a 78 y.o. male who is here today for follow-up visit regarding moderate two-vessel coronary artery disease and mild aortic stenosis.  He has chronic medical conditions including obesity status post gastric sleeve surgery, chronic back pain, GERD, hyperlipidemia, essential hypertension and hypothyroidism.  He underwent a right and left cardiac catheterization in November 2022 which showed moderate two-vessel coronary artery disease with moderate to severe calcifications especially in the proximal LAD.  The stenosis in the proximal LAD was interrogated with fractional flow reserve and was not significant with an IFR ratio of 0.93.  He was found to have mild aortic stenosis with peak gradient of 12 to 15 mmHg.  Right heart catheterization showed mildly elevated filling pressures, mild pulmonary hypertension and normal cardiac output. He has history of intolerance to potent statins.  Ezetimibe  was added to pravastatin .   He lost significant weight with Wegovy and his blood pressure has been going down with continued weight loss.  He is no longer on a thiazide diuretic.  He is only on losartan .  He reports increased orthostatic dizziness, fatigue and mild shortness of breath over the last month.  No chest pain.   Past Medical History:  Diagnosis Date   Aortic atherosclerosis    Aortic stenosis    a. 02/2012 Echo: EF 60-65%, very mild AS; b. 11/2020 Echo: EF 60-65%, GrI DD, no rwma, Nl RV size/fxn, RVSP 23.49mmHg, Mild AS/AI (AoV mean grad ; Vmax 2.49m/s).   Arthritis    B12 deficiency    CAD (coronary artery disease)    11/2020 Cath: LM 30ost/m, LAD  60p (nl iFR @ 0.93), 73m, 50d, LCX 73m, OM1/2 nl, OM3 min irregs, RCA 65p, 30d, RPDA 50. Mild AS w/ peak grad of 12-75mmHg. RHC w/ mild PAH. **Avoid future cath via R Radial due to signif prox R RA stenosis**   Chronic back pain    Depression    Diastolic dysfunction    Erectile dysfunction    a.) on PDE5i (tadalafil )   Gallstone pancreatitis    GERD (gastroesophageal reflux disease)    Hyperlipidemia    Hypertension    Hypothyroidism    Idiopathic chronic pancreatitis (HCC)    Long-term use of aspirin  therapy    Obesity    a. s/p gastric sleeve.   Pseudophakia, both eyes    Stage 3a chronic kidney disease (CKD) (HCC)    Unstable angina (HCC)     Past Surgical History:  Procedure Laterality Date   BACK SURGERY     CATARACT EXTRACTION W/ INTRAOCULAR LENS  IMPLANT, BILATERAL     CHOLECYSTECTOMY N/A 09/09/2016   Procedure: LAPAROSCOPIC CHOLECYSTECTOMY WITH INTRAOPERATIVE CHOLANGIOGRAM;  Surgeon: Desiderio Schanz, MD;  Location: [facility];  Service: General;  Laterality: N/A;   CORONARY PRESSURE/FFR WITH 3D MAPPING N/A 11/05/2020   Procedure: Coronary Pressure Wire/FFR w/3D Mapping;  Surgeon: Cage Deatrice LABOR, MD;  Location: [facility];  Service: Cardiovascular;  Laterality: N/A;   KNEE ARTHROPLASTY Right 07/24/2016   Procedure: COMPUTER ASSISTED TOTAL KNEE ARTHROPLASTY;  Surgeon: Mardee Lynwood SQUIBB, MD;  Location: [facility];  Service:  Orthopedics;  Laterality: Right;   REVERSE SHOULDER ARTHROPLASTY Left 03/22/2023   Procedure: ARTHROPLASTY, SHOULDER, TOTAL, REVERSE;  Surgeon: Edie Norleen PARAS, MD;  Location: [facility];  Service: Orthopedics;  Laterality: Left;   RIGHT/LEFT HEART CATH AND CORONARY ANGIOGRAPHY Bilateral 11/05/2020   Procedure: RIGHT/LEFT HEART CATH AND CORONARY ANGIOGRAPHY;  Surgeon: Darron Deatrice LABOR, MD;  Location: [facility];  Service: Cardiovascular;  Laterality: Bilateral;   TOTAL KNEE ARTHROPLASTY Bilateral    UMBILICAL HERNIA REPAIR N/A 09/09/2016    Procedure: HERNIA REPAIR UMBILICAL ADULT;  Surgeon: Desiderio Schanz, MD;  Location: [facility];  Service: General;  Laterality: N/A;     Current Outpatient Medications  Medication Sig Dispense Refill   Acetaminophen -Pamabrom 500-25 MG TABS Take 1 tablet by mouth daily as needed (pain).     aspirin  EC 325 MG tablet Take 1 tablet (325 mg total) by mouth daily. Swallow whole.     benzonatate (TESSALON) 200 MG capsule Take 200 mg by mouth as needed.     buPROPion  (WELLBUTRIN  XL) 150 MG 24 hr tablet Take 150 mg by mouth daily.     chlorthalidone  (HYGROTON ) 25 MG tablet Take 1 tablet (25 mg total) by mouth daily. 90 tablet 2   Cholecalciferol (VITAMIN D) 50 MCG (2000 UT) tablet Take 2,000 Units by mouth daily.     Cyanocobalamin  5000 MCG/ML LIQD Take 5,000 mcg by mouth daily.     ezetimibe  (ZETIA ) 10 MG tablet TAKE 1 TABLET BY MOUTH DAILY 90 tablet 3   gabapentin  (NEURONTIN ) 100 MG capsule TAKE 2 CAPSULES(200 MG) BY MOUTH THREE TIMES DAILY. DO NOT. STOP MEDICINE ABRUPTLY. DECREASE BY 1 TABLET DAILY TO. STOP 180 capsule 1   hydrochlorothiazide  (HYDRODIURIL ) 25 MG tablet Take 25 mg by mouth daily.     ibuprofen (ADVIL) 200 MG tablet Take 200-400 mg by mouth every 6 (six) hours as needed for moderate pain (pain score 4-6).     levothyroxine  (SYNTHROID , LEVOTHROID) 88 MCG tablet Take 88 mcg by mouth daily before breakfast.     losartan  (COZAAR ) 50 MG tablet Take 50 mg by mouth daily.     LUMIGAN 0.01 % SOLN Place 1 drop into both eyes at bedtime.     mirabegron  ER (MYRBETRIQ ) 50 MG TB24 tablet Take 1 tablet (50 mg total) by mouth daily. 30 tablet 0   omeprazole (PRILOSEC) 40 MG capsule Take 40 mg by mouth daily.     Semaglutide-Weight Management (WEGOVY) 0.5 MG/0.5ML SOAJ Inject 0.5 mg into the skin every Saturday.     tadalafil  (CIALIS ) 5 MG tablet Take 1 tablet (5 mg total) by mouth daily. (Patient taking differently: Take 5 mg by mouth daily as needed for erectile dysfunction.) 90 tablet 3   traMADol   (ULTRAM ) 50 MG tablet Take 1 tablet (50 mg total) by mouth every 6 (six) hours as needed for severe pain (pain score 7-10). 30 tablet 0   traZODone  (DESYREL ) 50 MG tablet Take 50 mg by mouth at bedtime.     No current facility-administered medications for this visit.    Allergies:   Lipitor [atorvastatin], Simvastatin, and Statins    Social History:  The patient  reports that he quit smoking about 26 years ago. His smoking use included cigarettes. He started smoking about 26 years ago. He has never used smokeless tobacco. He reports that he does not drink alcohol  and does not use drugs.   Family History:  The patient's family history is negative for coronary artery disease.   ROS:  Please see the history of present illness.   Otherwise, review of systems are positive for none.   All other systems are reviewed and negative.    PHYSICAL EXAM: VS:  BP (!) 114/50 (BP Location: Left Arm, Patient Position: Sitting, Cuff Size: Large)   Pulse 65   Ht 5' 7 (1.702 m)   Wt 184 lb 8 oz (83.7 kg)   SpO2 98%   BMI 28.90 kg/m  , BMI Body mass index is 28.9 kg/m. GEN: Well nourished, well developed, in no acute distress  HEENT: normal  Neck: no JVD, carotid bruits, or masses Cardiac: RRR; no  rubs, or gallops,no edema .  3 /6 crescendo decrescendo systolic murmur in the aortic area which is late peaking.  S2 is very diminished. Respiratory:  clear to auscultation bilaterally, normal work of breathing GI: soft, nontender, nondistended, + BS MS: no deformity or atrophy  Skin: warm and dry, no rash Neuro:  Strength and sensation are intact Psych: euthymic mood, full affect   EKG:  EKG is ordered today. EKG showed : Normal sinus rhythm Cannot rule out Anterior infarct , age undetermined When compared with ECG of 16-Mar-2023 15:44, Borderline criteria for Inferior infarct are no longer Present    Recent Labs: 03/15/2023: ALT 25 03/23/2023: BUN 27; Creatinine, Ser 1.34; Hemoglobin 12.0;  Platelets 178; Potassium 4.1; Sodium 137    Lipid Panel No results found for: CHOL, TRIG, HDL, CHOLHDL, VLDL, LDLCALC, LDLDIRECT    Wt Readings from Last 3 Encounters:  11/22/23 184 lb 8 oz (83.7 kg)  11/14/23 210 lb 14.2 oz (95.7 kg)  07/11/23 210 lb 15.7 oz (95.7 kg)          10/26/2020    2:42 PM  PAD Screen  Previous PAD dx? No  Previous surgical procedure? No  Pain with walking? No  Feet/toe relief with dangling? No  Painful, non-healing ulcers? No  Extremities discolored? No      ASSESSMENT AND PLAN:  1.  Coronary artery disease involving native coronary arteries without angina: The patient has moderate two-vessel coronary artery disease with moderate severe calcifications.  No indication for revascularization at the present time.  He reports no chest pain.  Continue aspirin  81 mg daily.  2.  Essential hypertension: His blood pressure continues to be on the low side with continued weight loss.  This might be causing some of his orthostatic dizziness.  I elected to decrease losartan  to 25 mg daily.  3.  Hyperlipidemia: He is no longer on pravastatin  for unclear reason and continues to take ezetimibe  10 mg daily.  However, his most recent lipid profile in June showed that his LDL was 28 which is not at target.  I elected to resume pravastatin  at 40 mg daily.  4.  Aortic stenosis: This could be responsible for his recent symptoms of dizziness and increased shortness of breath as his physical exam is suggestive of at least moderate to severe aortic stenosis.  I requested an echocardiogram for evaluation.   Disposition: Follow-up in 3 months.   Signed,  Deatrice Cage, MD  11/22/2023 8:20 AM    [facility]

## 2023-11-22 NOTE — Patient Instructions (Addendum)
 Medication Instructions:  DECREASE the Losartan  to 25 mg once daily  START Pravastatin  40 mg once daily  *If you need a refill on your cardiac medications before your next appointment, please call your pharmacy*  Lab Work: None ordered If you have labs (blood work) drawn today and your tests are completely normal, you will receive your results only by: MyChart Message (if you have MyChart) OR A paper copy in the mail If you have any lab test that is abnormal or we need to change your treatment, we will call you to review the results.  Testing/Procedures: Your physician has requested that you have an echocardiogram. Echocardiography is a painless test that uses sound waves to create images of your heart. It provides your doctor with information about the size and shape of your heart and how well your heart's chambers and valves are working.   You may receive an ultrasound enhancing agent through an IV if needed to better visualize your heart during the echo. This procedure takes approximately one hour.  There are no restrictions for this procedure.  This will take place at [address] [facility] [address] [facility], [place] [address]  Please note: We ask at that you not bring children with you during ultrasound (echo/ vascular) testing. Due to room size and safety concerns, children are not allowed in the ultrasound rooms during exams. Our front office staff cannot provide observation of children in our lobby area while testing is being conducted. An adult accompanying a patient to their appointment will only be allowed in the ultrasound room at the discretion of the ultrasound technician under special circumstances. We apologize for any inconvenience.   Follow-Up: At [facility], you and your health needs are our priority.  As part of our continuing mission to provide you with exceptional heart care, our providers are all part of one team.  This team includes your primary  Cardiologist (physician) and Advanced Practice Providers or APPs (Physician Assistants and Nurse Practitioners) who all work together to provide you with the care you need, when you need it.  Your next appointment:   3 month(s)  Provider:   Dr. Arida

## 2023-11-27 ENCOUNTER — Ambulatory Visit
Admission: RE | Admit: 2023-11-27 | Discharge: 2023-11-27 | Disposition: A | Discharge status: Home / Self Care | Source: Ambulatory Visit | Attending: Ophthalmology | Admitting: Ophthalmology

## 2023-11-27 DIAGNOSIS — H5711 Ocular pain, right eye: Secondary | ICD-10-CM | POA: Insufficient documentation

## 2023-11-27 LAB — POCT I-STAT CREATININE: Creatinine, Ser: 1.6 mg/dL — ABNORMAL HIGH (ref 0.61–1.24)

## 2023-11-27 MED ORDER — IOHEXOL 300 MG/ML  SOLN
75.0000 mL | Freq: Once | INTRAMUSCULAR | Status: AC | PRN
  Administered 2023-11-27: 75 mL via INTRAVENOUS

## 2023-12-04 DIAGNOSIS — H16223 Keratoconjunctivitis sicca, not specified as Sjogren's, bilateral: Secondary | ICD-10-CM | POA: Diagnosis not present

## 2023-12-04 DIAGNOSIS — H5711 Ocular pain, right eye: Secondary | ICD-10-CM | POA: Diagnosis not present

## 2023-12-04 DIAGNOSIS — H40051 Ocular hypertension, right eye: Secondary | ICD-10-CM | POA: Diagnosis not present

## 2023-12-24 ENCOUNTER — Ambulatory Visit: Attending: Cardiovascular Disease

## 2023-12-24 ENCOUNTER — Ambulatory Visit: Payer: Self-pay | Admitting: Cardiovascular Disease

## 2023-12-24 DIAGNOSIS — I350 Nonrheumatic aortic (valve) stenosis: Secondary | ICD-10-CM

## 2023-12-24 LAB — ECHOCARDIOGRAM COMPLETE
AR max vel: 1.4 cm2
AV Area VTI: 1.46 cm2
AV Area mean vel: 1.4 cm2
AV Mean grad: 14.4 mmHg
AV Peak grad: 24.9 mmHg
Ao pk vel: 2.5 m/s
Area-P 1/2: 2.8 cm2
Calc EF: 53.9 %
P 1/2 time: 456 ms
S' Lateral: 2.9 cm
Single Plane A2C EF: 53.1 %
Single Plane A4C EF: 56.4 %

## 2024-01-16 ENCOUNTER — Ambulatory Visit: Admitting: Podiatry

## 2024-01-16 VITALS — Ht 67.0 in | Wt 184.0 lb

## 2024-01-16 DIAGNOSIS — M79675 Pain in left toe(s): Secondary | ICD-10-CM | POA: Diagnosis not present

## 2024-01-16 DIAGNOSIS — M79674 Pain in right toe(s): Secondary | ICD-10-CM | POA: Diagnosis not present

## 2024-01-16 DIAGNOSIS — B351 Tinea unguium: Secondary | ICD-10-CM | POA: Diagnosis not present

## 2024-01-16 NOTE — Progress Notes (Signed)
"  °  Subjective:  Patient ID: Jared Baldwin, male    DOB: April 22, 1945,  MRN: [id number]  Chief Complaint  Patient presents with   Nail Problem    RM 4 RFC    79 y.o. male presents with the above complaint. History confirmed with patient.  Nails are thickened elongated causing discomfort again.  Previous debridements have been quite helpful.  Objective:  Physical Exam: warm, good capillary refill, no trophic changes or ulcerative lesions, normal DP and PT pulses, and normal sensory exam. Left Foot: dystrophic yellowed discolored nail plates with subungual debris Right Foot: dystrophic yellowed discolored nail plates with subungual debris  Assessment:   1. Pain due to onychomycosis of toenails of both feet       Plan:  Patient was evaluated and treated and all questions answered.  Discussed the etiology and treatment options for the condition in detail with the patient.  Regular debridement has been helpful. Recommended debridement of the nails today. Sharp and mechanical debridement performed of all painful and mycotic nails today. Nails debrided in length and thickness using a nail nipper and rotary bur to level of comfort. Discussed treatment options including appropriate shoe gear. Follow up as needed for painful nails.     Return in about 9 years (around 01/15/2033) for painful thick fungal nails.  "

## 2024-01-29 ENCOUNTER — Ambulatory Visit: Admitting: Urology

## 2024-01-30 ENCOUNTER — Other Ambulatory Visit: Payer: Self-pay | Admitting: Urology

## 2024-01-31 ENCOUNTER — Ambulatory Visit: Payer: Self-pay | Admitting: Urology

## 2024-01-31 ENCOUNTER — Ambulatory Visit: Payer: PPO | Admitting: Physician Assistant

## 2024-02-06 ENCOUNTER — Ambulatory Visit: Admitting: Urology

## 2024-02-22 ENCOUNTER — Ambulatory Visit: Admitting: Cardiovascular Disease

## 2024-03-05 ENCOUNTER — Ambulatory Visit: Admitting: Urology

## 2024-03-19 ENCOUNTER — Ambulatory Visit: Admitting: Podiatry
